# Patient Record
Sex: Female | Born: 1995
Health system: Southern US, Community
[De-identification: ages and names within clinical notes are randomized; demographics above are authoritative.]

## PROBLEM LIST (undated history)

## (undated) DIAGNOSIS — D649 Anemia, unspecified: Secondary | ICD-10-CM

## (undated) DIAGNOSIS — IMO0002 Reserved for concepts with insufficient information to code with codable children: Secondary | ICD-10-CM

## (undated) DIAGNOSIS — T7840XA Allergy, unspecified, initial encounter: Secondary | ICD-10-CM

## (undated) HISTORY — DX: Reserved for concepts with insufficient information to code with codable children: IMO0002

## (undated) HISTORY — DX: Allergy, unspecified, initial encounter: T78.40XA

## (undated) HISTORY — PX: NO PAST SURGERIES: SHX2092

## (undated) HISTORY — DX: Anemia, unspecified: D64.9

---
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
## 2015-02-12 ENCOUNTER — Ambulatory Visit (INDEPENDENT_AMBULATORY_CARE_PROVIDER_SITE_OTHER): Payer: BLUE CROSS/BLUE SHIELD | Admitting: Internal Medicine

## 2015-02-12 ENCOUNTER — Encounter: Payer: Self-pay | Admitting: Internal Medicine

## 2015-02-12 VITALS — BP 120/84 | Temp 98.7°F | Ht 62.75 in | Wt 145.0 lb

## 2015-02-12 DIAGNOSIS — Z113 Encounter for screening for infections with a predominantly sexual mode of transmission: Secondary | ICD-10-CM

## 2015-02-12 DIAGNOSIS — Z0000 Encounter for general adult medical examination without abnormal findings: Secondary | ICD-10-CM

## 2015-02-12 LAB — CBC WITH DIFFERENTIAL/PLATELET
BASOS PCT: 0.4 % (ref 0.0–3.0)
Basophils Absolute: 0 10*3/uL (ref 0.0–0.1)
EOS PCT: 1.3 % (ref 0.0–5.0)
Eosinophils Absolute: 0.1 10*3/uL (ref 0.0–0.7)
HCT: 36.8 % (ref 36.0–49.0)
HEMOGLOBIN: 11.9 g/dL — AB (ref 12.0–16.0)
LYMPHS ABS: 2.3 10*3/uL (ref 0.7–4.0)
Lymphocytes Relative: 24.1 % (ref 24.0–48.0)
MCHC: 32.2 g/dL (ref 31.0–37.0)
MCV: 79.1 fl (ref 78.0–98.0)
MONO ABS: 0.7 10*3/uL (ref 0.1–1.0)
Monocytes Relative: 7.6 % (ref 3.0–12.0)
Neutro Abs: 6.3 10*3/uL (ref 1.4–7.7)
Neutrophils Relative %: 66.6 % (ref 43.0–71.0)
Platelets: 289 10*3/uL (ref 150.0–575.0)
RBC: 4.66 Mil/uL (ref 3.80–5.70)
RDW: 14.3 % (ref 11.4–15.5)
WBC: 9.5 10*3/uL (ref 4.5–13.5)

## 2015-02-12 LAB — RPR

## 2015-02-12 NOTE — Progress Notes (Signed)
Pre visit review using our clinic review tool, if applicable. No additional management support is needed unless otherwise documented below in the visit note.   Chief Complaint  Patient presents with  . Well Child  . Annual Exam    HPI: Patient  Brittany Simon  20 y.o. comes in today for Preventive Health Care visit   To do semester in [place].    A sophomore.    At [employer] education minor in [place]. No major concerns  . Wants to be screening for sti  No current relation ship condoms in past . Periods  :  "worse when younger" some headaches  .   And cramps   Last 4-6 . Tired ibu   Tylenol.  No ref need for inhaler or regular meds   Health Maintenance  Topic Date Due  . HIV Screening  08/29/2010  . TETANUS/TDAP  08/29/2014  . INFLUENZA VACCINE  09/04/2015   Health Maintenance Review LIFESTYLE:  Exercise:    Walking   Active  Tobacco/ETS:no Alcohol:  No  Sugar beverages: sweet tea.  Sleep: about 7   Drug use: no   ROS:  GEN/ HEENT: No fever, significant weight changes sweats headaches vision problems hearing changes, CV/ PULM; No chest pain shortness of breath cough, syncope,edema  change in exercise tolerance. GI /GU: No adominal pain, vomiting, change in bowel habits. No blood in the stool. No significant GU symptoms. SKIN/HEME: ,no acute skin rashes suspicious lesions or bleeding. No lymphadenopathy, nodules, masses.  NEURO/ PSYCH:  No neurologic signs such as weakness numbness. No depression anxiety. IMM/ Allergy: No unusual infections.  Allergy .   REST of 12 system review negative except as per HPI   Past Medical History  Diagnosis Date  . Allergy   . Asthma   . Prematurity, fetus 35-36 completed weeks of gestation      4 40z 36 weeks    No past surgical history on file.  Family History  Problem Relation Age of Onset  . Asthma Father     Social History   Social History  . Marital Status: Single    Spouse Name: N/A  . Number of Children: N/A  . Years  of Education: N/A   Social History Main Topics  . Smoking status: Never Smoker   . Smokeless tobacco: None  . Alcohol Use: None  . Drug Use: None  . Sexual Activity: Not Asked   Other Topics Concern  . None   Social History Narrative   Single   Orig from [facility] of 4  One dog      IB program.    [employer].   [employer] fall 78   [employer]    Education major, spanish minor     Outpatient Prescriptions Prior to Visit  Medication Sig Dispense Refill  . albuterol (PROAIR HFA) 108 (90 BASE) MCG/ACT inhaler Inhale 2 puffs into the lungs every 6 (six) hours as needed for wheezing. 1 Inhaler 0  . fexofenadine (ALLEGRA) 60 MG tablet Take 60 mg by mouth daily.    . fluticasone (FLONASE) 50 MCG/ACT nasal spray 2 spray each nostril qd 16 g 3   No facility-administered medications prior to visit.     EXAM:  BP 120/84 mmHg  Temp(Src) 98.7 F (37.1 C) (Oral)  Ht 5' 2.75" (1.594 m)  Wt 145 lb (65.772 kg)  BMI 25.89 kg/m2  Body mass index is 25.89 kg/(m^2).  Physical Exam: Vital signs reviewed JOI:NOMV is a  well-developed well-nourished alert cooperative    who appearsr stated age in no acute distress.  HEENT: normocephalic atraumatic , Eyes: PERRL EOM's full, conjunctiva clear, Nares: paten,t no deformity discharge or tenderness., Ears: no deformity EAC's clear TMs with normal landmarks. Mouth: clear OP, no lesions, edema.  Moist mucous membranes. Dentition in adequate repair. NECK: supple without masses, thyromegaly or bruits. CHEST/PULM:  Clear to auscultation and percussion breath sounds equal no wheeze , rales or rhonchi. No chest wall deformities or tenderness.Breast: normal by inspection . No dimpling, discharge, masses, tenderness or discharge . CV: PMI is nondisplaced, S1 S2 no gallops, murmurs, rubs. Peripheral pulses are full without delay.No JVD .  ABDOMEN: Bowel sounds normal nontender  No guard or rebound, no hepato splenomegal no CVA tenderness.  Pierced umbilicus   Extremtities:  No clubbing cyanosis or edema, no acute joint swelling or redness no focal atrophy NEURO:  Oriented x3, cranial nerves 3-12 appear to be intact, no obvious focal weakness,gait within normal limits no abnormal reflexes or asymmetrical SKIN: No acute rashes normal turgor, color, no bruising or petechiae. PSYCH: Oriented, good eye contact, no obvious depression anxiety, cognition and judgment appear normal. LN: no cervical axillary inguinal adenopathy  Lab Results  Component Value Date   WBC 7.0 07/08/2013   HGB 12.3 07/08/2013   HCT 37.2 07/08/2013   PLT 287 07/08/2013   GLUCOSE 74 07/08/2013   CHOL 155 07/08/2013   TRIG 61 07/08/2013   HDL 57 07/08/2013   LDLCALC 86 07/08/2013   ALT 9 07/08/2013   AST 13 07/08/2013   NA 140 07/08/2013   K 3.9 07/08/2013   CL 105 07/08/2013   CREATININE 0.46 07/08/2013   BUN 11 07/08/2013   CO2 27 07/08/2013   TSH 1.031 07/08/2013    ASSESSMENT AND PLAN:  Discussed the following assessment and plan:  Visit for preventive health examination - Plan: GC/chlamydia probe amp, urine, HIV antibody, RPR, CBC with Differential/Platelet  Routine screening for STI (sexually transmitted infection) - Plan: GC/chlamydia probe amp, urine, HIV antibody, RPR, CBC with Differential/Platelet Chart reviewed: immunizations are up to date and documented.  Patient Care Team: Burnis Medin, MD as PCP - General Patient Instructions  Will notify you  of labs when available.  Please sign up for my chart . Take ibuprofen early .   toi prevent cramps from getting full blown .   Preventive Care for Adults, Female A healthy lifestyle and preventive care can promote health and wellness. Preventive health guidelines for women include the following key practices.  A routine yearly physical is a good way to check with your health care provider about your health and preventive screening. It is a chance to share any concerns and updates on your health and  to receive a thorough exam.  Visit your dentist for a routine exam and preventive care every 6 months. Brush your teeth twice a day and floss once a day. Good oral hygiene prevents tooth decay and gum disease.  The frequency of eye exams is based on your age, health, family medical history, use of contact lenses, and other factors. Follow your health care provider's recommendations for frequency of eye exams.  Eat a healthy diet. Foods like vegetables, fruits, whole grains, low-fat dairy products, and lean protein foods contain the nutrients you need without too many calories. Decrease your intake of foods high in solid fats, added sugars, and salt. Eat the right amount of calories for you.Get information about a proper diet from  your health care provider, if necessary.  Regular physical exercise is one of the most important things you can do for your health. Most adults should get at least 150 minutes of moderate-intensity exercise (any activity that increases your heart rate and causes you to sweat) each week. In addition, most adults need muscle-strengthening exercises on 2 or more days a week.  Maintain a healthy weight. The body mass index (BMI) is a screening tool to identify possible weight problems. It provides an estimate of body fat based on height and weight. Your health care provider can find your BMI and can help you achieve or maintain a healthy weight.For adults 20 years and older:  A BMI below 18.5 is considered underweight.  A BMI of 18.5 to 24.9 is normal.  A BMI of 25 to 29.9 is considered overweight.  A BMI of 30 and above is considered obese.  Maintain normal blood lipids and cholesterol levels by exercising and minimizing your intake of saturated fat. Eat a balanced diet with plenty of fruit and vegetables. Blood tests for lipids and cholesterol should begin at age 86 and be repeated every 5 years. If your lipid or cholesterol levels are high, you are over 50, or you are  at high risk for heart disease, you may need your cholesterol levels checked more frequently.Ongoing high lipid and cholesterol levels should be treated with medicines if diet and exercise are not working.  If you smoke, find out from your health care provider how to quit. If you do not use tobacco, do not start.  Lung cancer screening is recommended for adults aged 28-80 years who are at high risk for developing lung cancer because of a history of smoking. A yearly low-dose CT scan of the lungs is recommended for people who have at least a 30-pack-year history of smoking and are a current smoker or have quit within the past 15 years. A pack year of smoking is smoking an average of 1 pack of cigarettes a day for 1 year (for example: 1 pack a day for 30 years or 2 packs a day for 15 years). Yearly screening should continue until the smoker has stopped smoking for at least 15 years. Yearly screening should be stopped for people who develop a health problem that would prevent them from having lung cancer treatment.  If you are pregnant, do not drink alcohol. If you are breastfeeding, be very cautious about drinking alcohol. If you are not pregnant and choose to drink alcohol, do not have more than 1 drink per day. One drink is considered to be 12 ounces (355 mL) of beer, 5 ounces (148 mL) of wine, or 1.5 ounces (44 mL) of liquor.  Avoid use of street drugs. Do not share needles with anyone. Ask for help if you need support or instructions about stopping the use of drugs.  High blood pressure causes heart disease and increases the risk of stroke. Your blood pressure should be checked at least every 1 to 2 years. Ongoing high blood pressure should be treated with medicines if weight loss and exercise do not work.  If you are 32-37 years old, ask your health care provider if you should take aspirin to prevent strokes.  Diabetes screening is done by taking a blood sample to check your blood glucose level  after you have not eaten for a certain period of time (fasting). If you are not overweight and you do not have risk factors for diabetes, you should be screened  once every 3 years starting at age 43. If you are overweight or obese and you are 11-54 years of age, you should be screened for diabetes every year as part of your cardiovascular risk assessment.  Breast cancer screening is essential preventive care for women. You should practice "breast self-awareness." This means understanding the normal appearance and feel of your breasts and may include breast self-examination. Any changes detected, no matter how small, should be reported to a health care provider. Women in their 39s and 30s should have a clinical breast exam (CBE) by a health care provider as part of a regular health exam every 1 to 3 years. After age 18, women should have a CBE every year. Starting at age 53, women should consider having a mammogram (breast X-ray test) every year. Women who have a family history of breast cancer should talk to their health care provider about genetic screening. Women at a high risk of breast cancer should talk to their health care providers about having an MRI and a mammogram every year.  Breast cancer gene (BRCA)-related cancer risk assessment is recommended for women who have family members with BRCA-related cancers. BRCA-related cancers include breast, ovarian, tubal, and peritoneal cancers. Having family members with these cancers may be associated with an increased risk for harmful changes (mutations) in the breast cancer genes BRCA1 and BRCA2. Results of the assessment will determine the need for genetic counseling and BRCA1 and BRCA2 testing.  Your health care provider may recommend that you be screened regularly for cancer of the pelvic organs (ovaries, uterus, and vagina). This screening involves a pelvic examination, including checking for microscopic changes to the surface of your cervix (Pap test).  You may be encouraged to have this screening done every 3 years, beginning at age 64.  For women ages 27-65, health care providers may recommend pelvic exams and Pap testing every 3 years, or they may recommend the Pap and pelvic exam, combined with testing for human papilloma virus (HPV), every 5 years. Some types of HPV increase your risk of cervical cancer. Testing for HPV may also be done on women of any age with unclear Pap test results.  Other health care providers may not recommend any screening for nonpregnant women who are considered low risk for pelvic cancer and who do not have symptoms. Ask your health care provider if a screening pelvic exam is right for you.  If you have had past treatment for cervical cancer or a condition that could lead to cancer, you need Pap tests and screening for cancer for at least 20 years after your treatment. If Pap tests have been discontinued, your risk factors (such as having a new sexual partner) need to be reassessed to determine if screening should resume. Some women have medical problems that increase the chance of getting cervical cancer. In these cases, your health care provider may recommend more frequent screening and Pap tests.  Colorectal cancer can be detected and often prevented. Most routine colorectal cancer screening begins at the age of 71 years and continues through age 52 years. However, your health care provider may recommend screening at an earlier age if you have risk factors for colon cancer. On a yearly basis, your health care provider may provide home test kits to check for hidden blood in the stool. Use of a small camera at the end of a tube, to directly examine the colon (sigmoidoscopy or colonoscopy), can detect the earliest forms of colorectal cancer. Talk to your health  care provider about this at age 56, when routine screening begins. Direct exam of the colon should be repeated every 5-10 years through age 40 years, unless early  forms of precancerous polyps or small growths are found.  People who are at an increased risk for hepatitis B should be screened for this virus. You are considered at high risk for hepatitis B if:  You were born in a country where hepatitis B occurs often. Talk with your health care provider about which countries are considered high risk.  Your parents were born in a high-risk country and you have not received a shot to protect against hepatitis B (hepatitis B vaccine).  You have HIV or AIDS.  You use needles to inject street drugs.  You live with, or have sex with, someone who has hepatitis B.  You get hemodialysis treatment.  You take certain medicines for conditions like cancer, organ transplantation, and autoimmune conditions.  Hepatitis C blood testing is recommended for all people born from [birth year] through 1965 and any individual with known risks for hepatitis C.  Practice safe sex. Use condoms and avoid high-risk sexual practices to reduce the spread of sexually transmitted infections (STIs). STIs include gonorrhea, chlamydia, syphilis, trichomonas, herpes, HPV, and human immunodeficiency virus (HIV). Herpes, HIV, and HPV are viral illnesses that have no cure. They can result in disability, cancer, and death.  You should be screened for sexually transmitted illnesses (STIs) including gonorrhea and chlamydia if:  You are sexually active and are younger than 24 years.  You are older than 24 years and your health care provider tells you that you are at risk for this type of infection.  Your sexual activity has changed since you were last screened and you are at an increased risk for chlamydia or gonorrhea. Ask your health care provider if you are at risk.  If you are at risk of being infected with HIV, it is recommended that you take a prescription medicine daily to prevent HIV infection. This is called preexposure prophylaxis (PrEP). You are considered at risk if:  You are sexually  active and do not regularly use condoms or know the HIV status of your partner(s).  You take drugs by injection.  You are sexually active with a partner who has HIV.  Talk with your health care provider about whether you are at high risk of being infected with HIV. If you choose to begin PrEP, you should first be tested for HIV. You should then be tested every 3 months for as long as you are taking PrEP.  Osteoporosis is a disease in which the bones lose minerals and strength with aging. This can result in serious bone fractures or breaks. The risk of osteoporosis can be identified using a bone density scan. Women ages 55 years and over and women at risk for fractures or osteoporosis should discuss screening with their health care providers. Ask your health care provider whether you should take a calcium supplement or vitamin D to reduce the rate of osteoporosis.  Menopause can be associated with physical symptoms and risks. Hormone replacement therapy is available to decrease symptoms and risks. You should talk to your health care provider about whether hormone replacement therapy is right for you.  Use sunscreen. Apply sunscreen liberally and repeatedly throughout the day. You should seek shade when your shadow is shorter than you. Protect yourself by wearing long sleeves, pants, a wide-brimmed hat, and sunglasses year round, whenever you are outdoors.  Once a month,  do a whole body skin exam, using a mirror to look at the skin on your back. Tell your health care provider of new moles, moles that have irregular borders, moles that are larger than a pencil eraser, or moles that have changed in shape or color.  Stay current with required vaccines (immunizations).  Influenza vaccine. All adults should be immunized every year.  Tetanus, diphtheria, and acellular pertussis (Td, Tdap) vaccine. Pregnant women should receive 1 dose of Tdap vaccine during each pregnancy. The dose should be obtained  regardless of the length of time since the last dose. Immunization is preferred during the 27th-36th week of gestation. An adult who has not previously received Tdap or who does not know her vaccine status should receive 1 dose of Tdap. This initial dose should be followed by tetanus and diphtheria toxoids (Td) booster doses every 10 years. Adults with an unknown or incomplete history of completing a 3-dose immunization series with Td-containing vaccines should begin or complete a primary immunization series including a Tdap dose. Adults should receive a Td booster every 10 years.  Varicella vaccine. An adult without evidence of immunity to varicella should receive 2 doses or a second dose if she has previously received 1 dose. Pregnant females who do not have evidence of immunity should receive the first dose after pregnancy. This first dose should be obtained before leaving the health care facility. The second dose should be obtained 4-8 weeks after the first dose.  Human papillomavirus (HPV) vaccine. Females aged 13-26 years who have not received the vaccine previously should obtain the 3-dose series. The vaccine is not recommended for use in pregnant females. However, pregnancy testing is not needed before receiving a dose. If a female is found to be pregnant after receiving a dose, no treatment is needed. In that case, the remaining doses should be delayed until after the pregnancy. Immunization is recommended for any person with an immunocompromised condition through the age of 59 years if she did not get any or all doses earlier. During the 3-dose series, the second dose should be obtained 4-8 weeks after the first dose. The third dose should be obtained 24 weeks after the first dose and 16 weeks after the second dose.  Zoster vaccine. One dose is recommended for adults aged 22 years or older unless certain conditions are present.  Measles, mumps, and rubella (MMR) vaccine. Adults born before [birth year]  generally are considered immune to measles and mumps. Adults born in [birth year] or later should have 1 or more doses of MMR vaccine unless there is a contraindication to the vaccine or there is laboratory evidence of immunity to each of the three diseases. A routine second dose of MMR vaccine should be obtained at least 28 days after the first dose for students attending postsecondary schools, health care workers, or international travelers. People who received inactivated measles vaccine or an unknown type of measles vaccine during 1963-1967 should receive 2 doses of MMR vaccine. People who received inactivated mumps vaccine or an unknown type of mumps vaccine before 1979 and are at high risk for mumps infection should consider immunization with 2 doses of MMR vaccine. For females of childbearing age, rubella immunity should be determined. If there is no evidence of immunity, females who are not pregnant should be vaccinated. If there is no evidence of immunity, females who are pregnant should delay immunization until after pregnancy. Unvaccinated health care workers born before [birth year] who lack laboratory evidence of measles, mumps, or rubella immunity  or laboratory confirmation of disease should consider measles and mumps immunization with 2 doses of MMR vaccine or rubella immunization with 1 dose of MMR vaccine.  Pneumococcal 13-valent conjugate (PCV13) vaccine. When indicated, a person who is uncertain of his immunization history and has no record of immunization should receive the PCV13 vaccine. All adults 21 years of age and older should receive this vaccine. An adult aged 34 years or older who has certain medical conditions and has not been previously immunized should receive 1 dose of PCV13 vaccine. This PCV13 should be followed with a dose of pneumococcal polysaccharide (PPSV23) vaccine. Adults who are at high risk for pneumococcal disease should obtain the PPSV23 vaccine at least 8 weeks after the dose of PCV13  vaccine. Adults older than 20 years of age who have normal immune system function should obtain the PPSV23 vaccine dose at least 1 year after the dose of PCV13 vaccine.  Pneumococcal polysaccharide (PPSV23) vaccine. When PCV13 is also indicated, PCV13 should be obtained first. All adults aged 55 years and older should be immunized. An adult younger than age 61 years who has certain medical conditions should be immunized. Any person who resides in a nursing home or long-term care facility should be immunized. An adult smoker should be immunized. People with an immunocompromised condition and certain other conditions should receive both PCV13 and PPSV23 vaccines. People with human immunodeficiency virus (HIV) infection should be immunized as soon as possible after diagnosis. Immunization during chemotherapy or radiation therapy should be avoided. Routine use of PPSV23 vaccine is not recommended for American Indians, [place] Natives, or people younger than 65 years unless there are medical conditions that require PPSV23 vaccine. When indicated, people who have unknown immunization and have no record of immunization should receive PPSV23 vaccine. One-time revaccination 5 years after the first dose of PPSV23 is recommended for people aged 19-64 years who have chronic kidney failure, nephrotic syndrome, asplenia, or immunocompromised conditions. People who received 1-2 doses of PPSV23 before age 34 years should receive another dose of PPSV23 vaccine at age 75 years or later if at least 5 years have passed since the previous dose. Doses of PPSV23 are not needed for people immunized with PPSV23 at or after age 3 years.  Meningococcal vaccine. Adults with asplenia or persistent complement component deficiencies should receive 2 doses of quadrivalent meningococcal conjugate (MenACWY-D) vaccine. The doses should be obtained at least 2 months apart. Microbiologists working with certain meningococcal bacteria, [employer]  recruits, people at risk during an outbreak, and people who travel to or live in countries with a high rate of meningitis should be immunized. A first-year college student up through age 43 years who is living in a residence hall should receive a dose if she did not receive a dose on or after her 16th birthday. Adults who have certain high-risk conditions should receive one or more doses of vaccine.  Hepatitis A vaccine. Adults who wish to be protected from this disease, have certain high-risk conditions, work with hepatitis A-infected animals, work in hepatitis A research labs, or travel to or work in countries with a high rate of hepatitis A should be immunized. Adults who were previously unvaccinated and who anticipate close contact with an international adoptee during the first 60 days after arrival in the [place] States from a country with a high rate of hepatitis A should be immunized.  Hepatitis B vaccine. Adults who wish to be protected from this disease, have certain high-risk conditions, may be exposed to  blood or other infectious body fluids, are household contacts or sex partners of hepatitis B positive people, are clients or workers in certain care facilities, or travel to or work in countries with a high rate of hepatitis B should be immunized.  Haemophilus influenzae type b (Hib) vaccine. A previously unvaccinated person with asplenia or sickle cell disease or having a scheduled splenectomy should receive 1 dose of Hib vaccine. Regardless of previous immunization, a recipient of a hematopoietic stem cell transplant should receive a 3-dose series 6-12 months after her successful transplant. Hib vaccine is not recommended for adults with HIV infection. Preventive Services / Frequency Ages 58 to 2 years  Blood pressure check.** / Every 3-5 years.  Lipid and cholesterol check.** / Every 5 years beginning at age 30.  Clinical breast exam.** / Every 3 years for women in their 46s and  59s.  BRCA-related cancer risk assessment.** / For women who have family members with a BRCA-related cancer (breast, ovarian, tubal, or peritoneal cancers).  Pap test.** / Every 2 years from ages 97 through 6. Every 3 years starting at age 26 through age 77 or 30 with a history of 3 consecutive normal Pap tests.  HPV screening.** / Every 3 years from ages 60 through ages 25 to 73 with a history of 3 consecutive normal Pap tests.  Hepatitis C blood test.** / For any individual with known risks for hepatitis C.  Skin self-exam. / Monthly.  Influenza vaccine. / Every year.  Tetanus, diphtheria, and acellular pertussis (Tdap, Td) vaccine.** / Consult your health care provider. Pregnant women should receive 1 dose of Tdap vaccine during each pregnancy. 1 dose of Td every 10 years.  Varicella vaccine.** / Consult your health care provider. Pregnant females who do not have evidence of immunity should receive the first dose after pregnancy.  HPV vaccine. / 3 doses over 6 months, if 9 and younger. The vaccine is not recommended for use in pregnant females. However, pregnancy testing is not needed before receiving a dose.  Measles, mumps, rubella (MMR) vaccine.** / You need at least 1 dose of MMR if you were born in 1957 or later. You may also need a 2nd dose. For females of childbearing age, rubella immunity should be determined. If there is no evidence of immunity, females who are not pregnant should be vaccinated. If there is no evidence of immunity, females who are pregnant should delay immunization until after pregnancy.  Pneumococcal 13-valent conjugate (PCV13) vaccine.** / Consult your health care provider.  Pneumococcal polysaccharide (PPSV23) vaccine.** / 1 to 2 doses if you smoke cigarettes or if you have certain conditions.  Meningococcal vaccine.** / 1 dose if you are age 24 to 68 years and a Market researcher living in a residence hall, or have one of several medical  conditions, you need to get vaccinated against meningococcal disease. You may also need additional booster doses.  Hepatitis A vaccine.** / Consult your health care provider.  Hepatitis B vaccine.** / Consult your health care provider.  Haemophilus influenzae type b (Hib) vaccine.** / Consult your health care provider. Ages 39 to 34 years  Blood pressure check.** / Every year.  Lipid and cholesterol check.** / Every 5 years beginning at age 46 years.  Lung cancer screening. / Every year if you are aged 8-80 years and have a 30-pack-year history of smoking and currently smoke or have quit within the past 15 years. Yearly screening is stopped once you have quit smoking for at least  15 years or develop a health problem that would prevent you from having lung cancer treatment.  Clinical breast exam.** / Every year after age 18 years.  BRCA-related cancer risk assessment.** / For women who have family members with a BRCA-related cancer (breast, ovarian, tubal, or peritoneal cancers).  Mammogram.** / Every year beginning at age 51 years and continuing for as long as you are in good health. Consult with your health care provider.  Pap test.** / Every 3 years starting at age 6 years through age 20 or 64 years with a history of 3 consecutive normal Pap tests.  HPV screening.** / Every 3 years from ages 39 years through ages 25 to 51 years with a history of 3 consecutive normal Pap tests.  Fecal occult blood test (FOBT) of stool. / Every year beginning at age 47 years and continuing until age 53 years. You may not need to do this test if you get a colonoscopy every 10 years.  Flexible sigmoidoscopy or colonoscopy.** / Every 5 years for a flexible sigmoidoscopy or every 10 years for a colonoscopy beginning at age 6 years and continuing until age 30 years.  Hepatitis C blood test.** / For all people born from [birth year] through 1965 and any individual with known risks for hepatitis C.  Skin  self-exam. / Monthly.  Influenza vaccine. / Every year.  Tetanus, diphtheria, and acellular pertussis (Tdap/Td) vaccine.** / Consult your health care provider. Pregnant women should receive 1 dose of Tdap vaccine during each pregnancy. 1 dose of Td every 10 years.  Varicella vaccine.** / Consult your health care provider. Pregnant females who do not have evidence of immunity should receive the first dose after pregnancy.  Zoster vaccine.** / 1 dose for adults aged 40 years or older.  Measles, mumps, rubella (MMR) vaccine.** / You need at least 1 dose of MMR if you were born in 1957 or later. You may also need a second dose. For females of childbearing age, rubella immunity should be determined. If there is no evidence of immunity, females who are not pregnant should be vaccinated. If there is no evidence of immunity, females who are pregnant should delay immunization until after pregnancy.  Pneumococcal 13-valent conjugate (PCV13) vaccine.** / Consult your health care provider.  Pneumococcal polysaccharide (PPSV23) vaccine.** / 1 to 2 doses if you smoke cigarettes or if you have certain conditions.  Meningococcal vaccine.** / Consult your health care provider.  Hepatitis A vaccine.** / Consult your health care provider.  Hepatitis B vaccine.** / Consult your health care provider.  Haemophilus influenzae type b (Hib) vaccine.** / Consult your health care provider. Ages 39 years and over  Blood pressure check.** / Every year.  Lipid and cholesterol check.** / Every 5 years beginning at age 34 years.  Lung cancer screening. / Every year if you are aged 12-80 years and have a 30-pack-year history of smoking and currently smoke or have quit within the past 15 years. Yearly screening is stopped once you have quit smoking for at least 15 years or develop a health problem that would prevent you from having lung cancer treatment.  Clinical breast exam.** / Every year after age 42  years.  BRCA-related cancer risk assessment.** / For women who have family members with a BRCA-related cancer (breast, ovarian, tubal, or peritoneal cancers).  Mammogram.** / Every year beginning at age 21 years and continuing for as long as you are in good health. Consult with your health care provider.  Pap test.** /  Every 3 years starting at age 71 years through age 45 or 47 years with 3 consecutive normal Pap tests. Testing can be stopped between 65 and 70 years with 3 consecutive normal Pap tests and no abnormal Pap or HPV tests in the past 10 years.  HPV screening.** / Every 3 years from ages 23 years through ages 68 or 32 years with a history of 3 consecutive normal Pap tests. Testing can be stopped between 65 and 70 years with 3 consecutive normal Pap tests and no abnormal Pap or HPV tests in the past 10 years.  Fecal occult blood test (FOBT) of stool. / Every year beginning at age 71 years and continuing until age 76 years. You may not need to do this test if you get a colonoscopy every 10 years.  Flexible sigmoidoscopy or colonoscopy.** / Every 5 years for a flexible sigmoidoscopy or every 10 years for a colonoscopy beginning at age 19 years and continuing until age 36 years.  Hepatitis C blood test.** / For all people born from [birth year] through 1965 and any individual with known risks for hepatitis C.  Osteoporosis screening.** / A one-time screening for women ages 27 years and over and women at risk for fractures or osteoporosis.  Skin self-exam. / Monthly.  Influenza vaccine. / Every year.  Tetanus, diphtheria, and acellular pertussis (Tdap/Td) vaccine.** / 1 dose of Td every 10 years.  Varicella vaccine.** / Consult your health care provider.  Zoster vaccine.** / 1 dose for adults aged 3 years or older.  Pneumococcal 13-valent conjugate (PCV13) vaccine.** / Consult your health care provider.  Pneumococcal polysaccharide (PPSV23) vaccine.** / 1 dose for all adults aged 94  years and older.  Meningococcal vaccine.** / Consult your health care provider.  Hepatitis A vaccine.** / Consult your health care provider.  Hepatitis B vaccine.** / Consult your health care provider.  Haemophilus influenzae type b (Hib) vaccine.** / Consult your health care provider. ** Family history and personal history of risk and conditions may change your health care provider's recommendations.   This information is not intended to replace advice given to you by your health care provider. Make sure you discuss any questions you have with your health care provider.   Document Released: 03/18/2001 Document Revised: 02/10/2014 Document Reviewed: 06/17/2010 Elsevier Interactive Patient Education 2016 [place]. Janiqua Friscia M.D.

## 2015-02-12 NOTE — Patient Instructions (Addendum)
Will notify you  of labs when available.  Please sign up for my chart . Take ibuprofen early .   toi prevent cramps from getting full blown .   Preventive Care for Adults, Female A healthy lifestyle and preventive care can promote health and wellness. Preventive health guidelines for women include the following key practices.  A routine yearly physical is a good way to check with your health care provider about your health and preventive screening. It is a chance to share any concerns and updates on your health and to receive a thorough exam.  Visit your dentist for a routine exam and preventive care every 6 months. Brush your teeth twice a day and floss once a day. Good oral hygiene prevents tooth decay and gum disease.  The frequency of eye exams is based on your age, health, family medical history, use of contact lenses, and other factors. Follow your health care provider's recommendations for frequency of eye exams.  Eat a healthy diet. Foods like vegetables, fruits, whole grains, low-fat dairy products, and lean protein foods contain the nutrients you need without too many calories. Decrease your intake of foods high in solid fats, added sugars, and salt. Eat the right amount of calories for you.Get information about a proper diet from your health care provider, if necessary.  Regular physical exercise is one of the most important things you can do for your health. Most adults should get at least 150 minutes of moderate-intensity exercise (any activity that increases your heart rate and causes you to sweat) each week. In addition, most adults need muscle-strengthening exercises on 2 or more days a week.  Maintain a healthy weight. The body mass index (BMI) is a screening tool to identify possible weight problems. It provides an estimate of body fat based on height and weight. Your health care provider can find your BMI and can help you achieve or maintain a healthy weight.For adults 20 years  and older:  A BMI below 18.5 is considered underweight.  A BMI of 18.5 to 24.9 is normal.  A BMI of 25 to 29.9 is considered overweight.  A BMI of 30 and above is considered obese.  Maintain normal blood lipids and cholesterol levels by exercising and minimizing your intake of saturated fat. Eat a balanced diet with plenty of fruit and vegetables. Blood tests for lipids and cholesterol should begin at age 36 and be repeated every 5 years. If your lipid or cholesterol levels are high, you are over 50, or you are at high risk for heart disease, you may need your cholesterol levels checked more frequently.Ongoing high lipid and cholesterol levels should be treated with medicines if diet and exercise are not working.  If you smoke, find out from your health care provider how to quit. If you do not use tobacco, do not start.  Lung cancer screening is recommended for adults aged 62-80 years who are at high risk for developing lung cancer because of a history of smoking. A yearly low-dose CT scan of the lungs is recommended for people who have at least a 30-pack-year history of smoking and are a current smoker or have quit within the past 15 years. A pack year of smoking is smoking an average of 1 pack of cigarettes a day for 1 year (for example: 1 pack a day for 30 years or 2 packs a day for 15 years). Yearly screening should continue until the smoker has stopped smoking for at least 15 years. Yearly  screening should be stopped for people who develop a health problem that would prevent them from having lung cancer treatment.  If you are pregnant, do not drink alcohol. If you are breastfeeding, be very cautious about drinking alcohol. If you are not pregnant and choose to drink alcohol, do not have more than 1 drink per day. One drink is considered to be 12 ounces (355 mL) of beer, 5 ounces (148 mL) of wine, or 1.5 ounces (44 mL) of liquor.  Avoid use of street drugs. Do not share needles with anyone.  Ask for help if you need support or instructions about stopping the use of drugs.  High blood pressure causes heart disease and increases the risk of stroke. Your blood pressure should be checked at least every 1 to 2 years. Ongoing high blood pressure should be treated with medicines if weight loss and exercise do not work.  If you are 54-8 years old, ask your health care provider if you should take aspirin to prevent strokes.  Diabetes screening is done by taking a blood sample to check your blood glucose level after you have not eaten for a certain period of time (fasting). If you are not overweight and you do not have risk factors for diabetes, you should be screened once every 3 years starting at age 84. If you are overweight or obese and you are 34-37 years of age, you should be screened for diabetes every year as part of your cardiovascular risk assessment.  Breast cancer screening is essential preventive care for women. You should practice "breast self-awareness." This means understanding the normal appearance and feel of your breasts and may include breast self-examination. Any changes detected, no matter how small, should be reported to a health care provider. Women in their 35s and 30s should have a clinical breast exam (CBE) by a health care provider as part of a regular health exam every 1 to 3 years. After age 60, women should have a CBE every year. Starting at age 45, women should consider having a mammogram (breast X-ray test) every year. Women who have a family history of breast cancer should talk to their health care provider about genetic screening. Women at a high risk of breast cancer should talk to their health care providers about having an MRI and a mammogram every year.  Breast cancer gene (BRCA)-related cancer risk assessment is recommended for women who have family members with BRCA-related cancers. BRCA-related cancers include breast, ovarian, tubal, and peritoneal cancers.  Having family members with these cancers may be associated with an increased risk for harmful changes (mutations) in the breast cancer genes BRCA1 and BRCA2. Results of the assessment will determine the need for genetic counseling and BRCA1 and BRCA2 testing.  Your health care provider may recommend that you be screened regularly for cancer of the pelvic organs (ovaries, uterus, and vagina). This screening involves a pelvic examination, including checking for microscopic changes to the surface of your cervix (Pap test). You may be encouraged to have this screening done every 3 years, beginning at age 93.  For women ages 31-65, health care providers may recommend pelvic exams and Pap testing every 3 years, or they may recommend the Pap and pelvic exam, combined with testing for human papilloma virus (HPV), every 5 years. Some types of HPV increase your risk of cervical cancer. Testing for HPV may also be done on women of any age with unclear Pap test results.  Other health care providers may not recommend  any screening for nonpregnant women who are considered low risk for pelvic cancer and who do not have symptoms. Ask your health care provider if a screening pelvic exam is right for you.  If you have had past treatment for cervical cancer or a condition that could lead to cancer, you need Pap tests and screening for cancer for at least 20 years after your treatment. If Pap tests have been discontinued, your risk factors (such as having a new sexual partner) need to be reassessed to determine if screening should resume. Some women have medical problems that increase the chance of getting cervical cancer. In these cases, your health care provider may recommend more frequent screening and Pap tests.  Colorectal cancer can be detected and often prevented. Most routine colorectal cancer screening begins at the age of 58 years and continues through age 25 years. However, your health care provider may recommend  screening at an earlier age if you have risk factors for colon cancer. On a yearly basis, your health care provider may provide home test kits to check for hidden blood in the stool. Use of a small camera at the end of a tube, to directly examine the colon (sigmoidoscopy or colonoscopy), can detect the earliest forms of colorectal cancer. Talk to your health care provider about this at age 14, when routine screening begins. Direct exam of the colon should be repeated every 5-10 years through age 4 years, unless early forms of precancerous polyps or small growths are found.  People who are at an increased risk for hepatitis B should be screened for this virus. You are considered at high risk for hepatitis B if:  You were born in a country where hepatitis B occurs often. Talk with your health care provider about which countries are considered high risk.  Your parents were born in a high-risk country and you have not received a shot to protect against hepatitis B (hepatitis B vaccine).  You have HIV or AIDS.  You use needles to inject street drugs.  You live with, or have sex with, someone who has hepatitis B.  You get hemodialysis treatment.  You take certain medicines for conditions like cancer, organ transplantation, and autoimmune conditions.  Hepatitis C blood testing is recommended for all people born from [birth year] through 1965 and any individual with known risks for hepatitis C.  Practice safe sex. Use condoms and avoid high-risk sexual practices to reduce the spread of sexually transmitted infections (STIs). STIs include gonorrhea, chlamydia, syphilis, trichomonas, herpes, HPV, and human immunodeficiency virus (HIV). Herpes, HIV, and HPV are viral illnesses that have no cure. They can result in disability, cancer, and death.  You should be screened for sexually transmitted illnesses (STIs) including gonorrhea and chlamydia if:  You are sexually active and are younger than 24 years.  You  are older than 24 years and your health care provider tells you that you are at risk for this type of infection.  Your sexual activity has changed since you were last screened and you are at an increased risk for chlamydia or gonorrhea. Ask your health care provider if you are at risk.  If you are at risk of being infected with HIV, it is recommended that you take a prescription medicine daily to prevent HIV infection. This is called preexposure prophylaxis (PrEP). You are considered at risk if:  You are sexually active and do not regularly use condoms or know the HIV status of your partner(s).  You take drugs by  injection.  You are sexually active with a partner who has HIV.  Talk with your health care provider about whether you are at high risk of being infected with HIV. If you choose to begin PrEP, you should first be tested for HIV. You should then be tested every 3 months for as long as you are taking PrEP.  Osteoporosis is a disease in which the bones lose minerals and strength with aging. This can result in serious bone fractures or breaks. The risk of osteoporosis can be identified using a bone density scan. Women ages 62 years and over and women at risk for fractures or osteoporosis should discuss screening with their health care providers. Ask your health care provider whether you should take a calcium supplement or vitamin D to reduce the rate of osteoporosis.  Menopause can be associated with physical symptoms and risks. Hormone replacement therapy is available to decrease symptoms and risks. You should talk to your health care provider about whether hormone replacement therapy is right for you.  Use sunscreen. Apply sunscreen liberally and repeatedly throughout the day. You should seek shade when your shadow is shorter than you. Protect yourself by wearing long sleeves, pants, a wide-brimmed hat, and sunglasses year round, whenever you are outdoors.  Once a month, do a whole body  skin exam, using a mirror to look at the skin on your back. Tell your health care provider of new moles, moles that have irregular borders, moles that are larger than a pencil eraser, or moles that have changed in shape or color.  Stay current with required vaccines (immunizations).  Influenza vaccine. All adults should be immunized every year.  Tetanus, diphtheria, and acellular pertussis (Td, Tdap) vaccine. Pregnant women should receive 1 dose of Tdap vaccine during each pregnancy. The dose should be obtained regardless of the length of time since the last dose. Immunization is preferred during the 27th-36th week of gestation. An adult who has not previously received Tdap or who does not know her vaccine status should receive 1 dose of Tdap. This initial dose should be followed by tetanus and diphtheria toxoids (Td) booster doses every 10 years. Adults with an unknown or incomplete history of completing a 3-dose immunization series with Td-containing vaccines should begin or complete a primary immunization series including a Tdap dose. Adults should receive a Td booster every 10 years.  Varicella vaccine. An adult without evidence of immunity to varicella should receive 2 doses or a second dose if she has previously received 1 dose. Pregnant females who do not have evidence of immunity should receive the first dose after pregnancy. This first dose should be obtained before leaving the health care facility. The second dose should be obtained 4-8 weeks after the first dose.  Human papillomavirus (HPV) vaccine. Females aged 13-26 years who have not received the vaccine previously should obtain the 3-dose series. The vaccine is not recommended for use in pregnant females. However, pregnancy testing is not needed before receiving a dose. If a female is found to be pregnant after receiving a dose, no treatment is needed. In that case, the remaining doses should be delayed until after the pregnancy.  Immunization is recommended for any person with an immunocompromised condition through the age of 26 years if she did not get any or all doses earlier. During the 3-dose series, the second dose should be obtained 4-8 weeks after the first dose. The third dose should be obtained 24 weeks after the first dose and 16 weeks  after the second dose.  Zoster vaccine. One dose is recommended for adults aged 45 years or older unless certain conditions are present.  Measles, mumps, and rubella (MMR) vaccine. Adults born before [birth year] generally are considered immune to measles and mumps. Adults born in [birth year] or later should have 1 or more doses of MMR vaccine unless there is a contraindication to the vaccine or there is laboratory evidence of immunity to each of the three diseases. A routine second dose of MMR vaccine should be obtained at least 28 days after the first dose for students attending postsecondary schools, health care workers, or international travelers. People who received inactivated measles vaccine or an unknown type of measles vaccine during 1963-1967 should receive 2 doses of MMR vaccine. People who received inactivated mumps vaccine or an unknown type of mumps vaccine before 1979 and are at high risk for mumps infection should consider immunization with 2 doses of MMR vaccine. For females of childbearing age, rubella immunity should be determined. If there is no evidence of immunity, females who are not pregnant should be vaccinated. If there is no evidence of immunity, females who are pregnant should delay immunization until after pregnancy. Unvaccinated health care workers born before [birth year] who lack laboratory evidence of measles, mumps, or rubella immunity or laboratory confirmation of disease should consider measles and mumps immunization with 2 doses of MMR vaccine or rubella immunization with 1 dose of MMR vaccine.  Pneumococcal 13-valent conjugate (PCV13) vaccine. When indicated, a person who is  uncertain of his immunization history and has no record of immunization should receive the PCV13 vaccine. All adults 6 years of age and older should receive this vaccine. An adult aged 75 years or older who has certain medical conditions and has not been previously immunized should receive 1 dose of PCV13 vaccine. This PCV13 should be followed with a dose of pneumococcal polysaccharide (PPSV23) vaccine. Adults who are at high risk for pneumococcal disease should obtain the PPSV23 vaccine at least 8 weeks after the dose of PCV13 vaccine. Adults older than 20 years of age who have normal immune system function should obtain the PPSV23 vaccine dose at least 1 year after the dose of PCV13 vaccine.  Pneumococcal polysaccharide (PPSV23) vaccine. When PCV13 is also indicated, PCV13 should be obtained first. All adults aged 1 years and older should be immunized. An adult younger than age 66 years who has certain medical conditions should be immunized. Any person who resides in a nursing home or long-term care facility should be immunized. An adult smoker should be immunized. People with an immunocompromised condition and certain other conditions should receive both PCV13 and PPSV23 vaccines. People with human immunodeficiency virus (HIV) infection should be immunized as soon as possible after diagnosis. Immunization during chemotherapy or radiation therapy should be avoided. Routine use of PPSV23 vaccine is not recommended for American Indians, [place] Natives, or people younger than 65 years unless there are medical conditions that require PPSV23 vaccine. When indicated, people who have unknown immunization and have no record of immunization should receive PPSV23 vaccine. One-time revaccination 5 years after the first dose of PPSV23 is recommended for people aged 19-64 years who have chronic kidney failure, nephrotic syndrome, asplenia, or immunocompromised conditions. People who received 1-2 doses of PPSV23 before age  61 years should receive another dose of PPSV23 vaccine at age 61 years or later if at least 5 years have passed since the previous dose. Doses of PPSV23 are not needed for people immunized with PPSV23  at or after age 33 years.  Meningococcal vaccine. Adults with asplenia or persistent complement component deficiencies should receive 2 doses of quadrivalent meningococcal conjugate (MenACWY-D) vaccine. The doses should be obtained at least 2 months apart. Microbiologists working with certain meningococcal bacteria, [employer], people at risk during an outbreak, and people who travel to or live in countries with a high rate of meningitis should be immunized. A first-year college student up through age 71 years who is living in a residence hall should receive a dose if she did not receive a dose on or after her 16th birthday. Adults who have certain high-risk conditions should receive one or more doses of vaccine.  Hepatitis A vaccine. Adults who wish to be protected from this disease, have certain high-risk conditions, work with hepatitis A-infected animals, work in hepatitis A research labs, or travel to or work in countries with a high rate of hepatitis A should be immunized. Adults who were previously unvaccinated and who anticipate close contact with an international adoptee during the first 60 days after arrival in the [place] from a country with a high rate of hepatitis A should be immunized.  Hepatitis B vaccine. Adults who wish to be protected from this disease, have certain high-risk conditions, may be exposed to blood or other infectious body fluids, are household contacts or sex partners of hepatitis B positive people, are clients or workers in certain care facilities, or travel to or work in countries with a high rate of hepatitis B should be immunized.  Haemophilus influenzae type b (Hib) vaccine. A previously unvaccinated person with asplenia or sickle cell disease or having a  scheduled splenectomy should receive 1 dose of Hib vaccine. Regardless of previous immunization, a recipient of a hematopoietic stem cell transplant should receive a 3-dose series 6-12 months after her successful transplant. Hib vaccine is not recommended for adults with HIV infection. Preventive Services / Frequency Ages 90 to 6 years  Blood pressure check.** / Every 3-5 years.  Lipid and cholesterol check.** / Every 5 years beginning at age 37.  Clinical breast exam.** / Every 3 years for women in their 66s and 78s.  BRCA-related cancer risk assessment.** / For women who have family members with a BRCA-related cancer (breast, ovarian, tubal, or peritoneal cancers).  Pap test.** / Every 2 years from ages 72 through 87. Every 3 years starting at age 86 through age 27 or 32 with a history of 3 consecutive normal Pap tests.  HPV screening.** / Every 3 years from ages 70 through ages 56 to 69 with a history of 3 consecutive normal Pap tests.  Hepatitis C blood test.** / For any individual with known risks for hepatitis C.  Skin self-exam. / Monthly.  Influenza vaccine. / Every year.  Tetanus, diphtheria, and acellular pertussis (Tdap, Td) vaccine.** / Consult your health care provider. Pregnant women should receive 1 dose of Tdap vaccine during each pregnancy. 1 dose of Td every 10 years.  Varicella vaccine.** / Consult your health care provider. Pregnant females who do not have evidence of immunity should receive the first dose after pregnancy.  HPV vaccine. / 3 doses over 6 months, if 58 and younger. The vaccine is not recommended for use in pregnant females. However, pregnancy testing is not needed before receiving a dose.  Measles, mumps, rubella (MMR) vaccine.** / You need at least 1 dose of MMR if you were born in 1957 or later. You may also need a 2nd dose. For females of  childbearing age, rubella immunity should be determined. If there is no evidence of immunity, females who are not  pregnant should be vaccinated. If there is no evidence of immunity, females who are pregnant should delay immunization until after pregnancy.  Pneumococcal 13-valent conjugate (PCV13) vaccine.** / Consult your health care provider.  Pneumococcal polysaccharide (PPSV23) vaccine.** / 1 to 2 doses if you smoke cigarettes or if you have certain conditions.  Meningococcal vaccine.** / 1 dose if you are age 80 to 44 years and a Market researcher living in a residence hall, or have one of several medical conditions, you need to get vaccinated against meningococcal disease. You may also need additional booster doses.  Hepatitis A vaccine.** / Consult your health care provider.  Hepatitis B vaccine.** / Consult your health care provider.  Haemophilus influenzae type b (Hib) vaccine.** / Consult your health care provider. Ages 21 to 33 years  Blood pressure check.** / Every year.  Lipid and cholesterol check.** / Every 5 years beginning at age 53 years.  Lung cancer screening. / Every year if you are aged 75-80 years and have a 30-pack-year history of smoking and currently smoke or have quit within the past 15 years. Yearly screening is stopped once you have quit smoking for at least 15 years or develop a health problem that would prevent you from having lung cancer treatment.  Clinical breast exam.** / Every year after age 65 years.  BRCA-related cancer risk assessment.** / For women who have family members with a BRCA-related cancer (breast, ovarian, tubal, or peritoneal cancers).  Mammogram.** / Every year beginning at age 4 years and continuing for as long as you are in good health. Consult with your health care provider.  Pap test.** / Every 3 years starting at age 34 years through age 60 or 9 years with a history of 3 consecutive normal Pap tests.  HPV screening.** / Every 3 years from ages 4 years through ages 10 to 16 years with a history of 3 consecutive normal Pap  tests.  Fecal occult blood test (FOBT) of stool. / Every year beginning at age 72 years and continuing until age 65 years. You may not need to do this test if you get a colonoscopy every 10 years.  Flexible sigmoidoscopy or colonoscopy.** / Every 5 years for a flexible sigmoidoscopy or every 10 years for a colonoscopy beginning at age 51 years and continuing until age 65 years.  Hepatitis C blood test.** / For all people born from [birth year] through 1965 and any individual with known risks for hepatitis C.  Skin self-exam. / Monthly.  Influenza vaccine. / Every year.  Tetanus, diphtheria, and acellular pertussis (Tdap/Td) vaccine.** / Consult your health care provider. Pregnant women should receive 1 dose of Tdap vaccine during each pregnancy. 1 dose of Td every 10 years.  Varicella vaccine.** / Consult your health care provider. Pregnant females who do not have evidence of immunity should receive the first dose after pregnancy.  Zoster vaccine.** / 1 dose for adults aged 34 years or older.  Measles, mumps, rubella (MMR) vaccine.** / You need at least 1 dose of MMR if you were born in 1957 or later. You may also need a second dose. For females of childbearing age, rubella immunity should be determined. If there is no evidence of immunity, females who are not pregnant should be vaccinated. If there is no evidence of immunity, females who are pregnant should delay immunization until after pregnancy.  Pneumococcal 13-valent conjugate (  PCV13) vaccine.** / Consult your health care provider.  Pneumococcal polysaccharide (PPSV23) vaccine.** / 1 to 2 doses if you smoke cigarettes or if you have certain conditions.  Meningococcal vaccine.** / Consult your health care provider.  Hepatitis A vaccine.** / Consult your health care provider.  Hepatitis B vaccine.** / Consult your health care provider.  Haemophilus influenzae type b (Hib) vaccine.** / Consult your health care provider. Ages 1 years and  over  Blood pressure check.** / Every year.  Lipid and cholesterol check.** / Every 5 years beginning at age 4 years.  Lung cancer screening. / Every year if you are aged 47-80 years and have a 30-pack-year history of smoking and currently smoke or have quit within the past 15 years. Yearly screening is stopped once you have quit smoking for at least 15 years or develop a health problem that would prevent you from having lung cancer treatment.  Clinical breast exam.** / Every year after age 79 years.  BRCA-related cancer risk assessment.** / For women who have family members with a BRCA-related cancer (breast, ovarian, tubal, or peritoneal cancers).  Mammogram.** / Every year beginning at age 44 years and continuing for as long as you are in good health. Consult with your health care provider.  Pap test.** / Every 3 years starting at age 38 years through age 66 or 83 years with 3 consecutive normal Pap tests. Testing can be stopped between 65 and 70 years with 3 consecutive normal Pap tests and no abnormal Pap or HPV tests in the past 10 years.  HPV screening.** / Every 3 years from ages 71 years through ages 47 or 11 years with a history of 3 consecutive normal Pap tests. Testing can be stopped between 65 and 70 years with 3 consecutive normal Pap tests and no abnormal Pap or HPV tests in the past 10 years.  Fecal occult blood test (FOBT) of stool. / Every year beginning at age 60 years and continuing until age 80 years. You may not need to do this test if you get a colonoscopy every 10 years.  Flexible sigmoidoscopy or colonoscopy.** / Every 5 years for a flexible sigmoidoscopy or every 10 years for a colonoscopy beginning at age 53 years and continuing until age 26 years.  Hepatitis C blood test.** / For all people born from [birth year] through 1965 and any individual with known risks for hepatitis C.  Osteoporosis screening.** / A one-time screening for women ages 28 years and over and women  at risk for fractures or osteoporosis.  Skin self-exam. / Monthly.  Influenza vaccine. / Every year.  Tetanus, diphtheria, and acellular pertussis (Tdap/Td) vaccine.** / 1 dose of Td every 10 years.  Varicella vaccine.** / Consult your health care provider.  Zoster vaccine.** / 1 dose for adults aged 61 years or older.  Pneumococcal 13-valent conjugate (PCV13) vaccine.** / Consult your health care provider.  Pneumococcal polysaccharide (PPSV23) vaccine.** / 1 dose for all adults aged 72 years and older.  Meningococcal vaccine.** / Consult your health care provider.  Hepatitis A vaccine.** / Consult your health care provider.  Hepatitis B vaccine.** / Consult your health care provider.  Haemophilus influenzae type b (Hib) vaccine.** / Consult your health care provider. ** Family history and personal history of risk and conditions may change your health care provider's recommendations.   This information is not intended to replace advice given to you by your health care provider. Make sure you discuss any questions you have with your health  care provider.   Document Released: 03/18/2001 Document Revised: 02/10/2014 Document Reviewed: 06/17/2010 Elsevier Interactive Patient Education [employer].

## 2015-02-13 ENCOUNTER — Other Ambulatory Visit: Payer: Self-pay | Admitting: Family Medicine

## 2015-02-13 LAB — HIV ANTIBODY (ROUTINE TESTING W REFLEX): HIV 1&2 Ab, 4th Generation: NONREACTIVE

## 2015-02-13 LAB — GC/CHLAMYDIA PROBE AMP
CT PROBE, AMP APTIMA: DETECTED — AB
GC Probe RNA: NOT DETECTED

## 2015-02-13 MED ORDER — AZITHROMYCIN 500 MG PO TABS: 1000.0000 mg | ORAL_TABLET | Freq: Once | ORAL | Status: DC

## 2015-02-16 ENCOUNTER — Telehealth: Payer: Self-pay | Admitting: Internal Medicine

## 2015-02-16 NOTE — Telephone Encounter (Signed)
Agree with above 

## 2015-02-16 NOTE — Telephone Encounter (Signed)
[facility] - Client TELEPHONE ADVICE RECORD [facility]  Patient Name: Brittany Simon  DOB: 1995-06-05    Initial Comment caller states her abx pill dissolved in her mouth, will it still be effective   Nurse Assessment  Nurse: Izola PriceMyers, RN, Cala BradfordKimberly Date/Time (Eastern Time): 02/16/2015 3:04:07 PM  Confirm and document reason for call. If symptomatic, describe symptoms. You must click the next button to save text entered. ---caller states her abx pill dissolved in her mouth, will it still be effective. Medication was azithromycin. took first pill and second one didn't go down and pt just had to continue to drink water until it dissolved in mouth.  Has the patient traveled out of the country within the last 30 days? ---Not Applicable  Does the patient have any new or worsening symptoms? ---No  Please document clinical information provided and list any resource used. ---Nurse advised pt that it is ok that pill dissolved and wasn't swallowed. Pt didn't feel nauseous afterward. Pt stated understanding.     Guidelines    Guideline Title Affirmed Question Affirmed Notes       Final Disposition User   Clinical Call Izola PriceMyers, RN, Cala BradfordKimberly

## 2015-11-02 ENCOUNTER — Ambulatory Visit (INDEPENDENT_AMBULATORY_CARE_PROVIDER_SITE_OTHER): Payer: BLUE CROSS/BLUE SHIELD | Admitting: Family Medicine

## 2015-11-02 DIAGNOSIS — Z23 Encounter for immunization: Secondary | ICD-10-CM

## 2016-05-20 NOTE — Progress Notes (Signed)
Chief Complaint  Patient presents with  . Allergies    HPI: Brittany Simon 21 y.o. sda   Bad year  Of allergies   Pollen for her is worse for her .  Sx sneezing itch eyes and  Runny nose    Cough and wheezing and sob.  taking benadryl for a few days    Some help. Rhinorrhea    Day Claritin  For last 3-4 days.  Haven started flonase yet  Usually helpful   2 weeks   Of cough .   Worse at night.   Remote use of  inhaler and singulair No tobacco   No pets.     Hx of cough and inhaler in allergies  .    ROS: See pertinent positives and negatives per HPI. No feer chills   Sob now  Sx worse at night   Past Medical History:  Diagnosis Date  . Allergy   . Asthma   . Prematurity, fetus 35-36 completed weeks of gestation     4 18z 36 weeks    Family History  Problem Relation Age of Onset  . Asthma Father     Social History   Social History  . Marital status: Single    Spouse name: N/A  . Number of children: N/A  . Years of education: N/A   Social History Main Topics  . Smoking status: Never Smoker  . Smokeless tobacco: Never Used  . Alcohol use None  . Drug use: Unknown  . Sexual activity: Not Asked   Other Topics Concern  . None   Social History Narrative   Single   Orig [facility] of 4  One dog      IB program.    [employer].   [employer] fall 15   [employer]    Education major, spanish minor     Outpatient Medications Prior to Visit  Medication Sig Dispense Refill  . fluticasone (FLONASE) 50 MCG/ACT nasal spray 2 spray each nostril qd (Patient not taking: Reported on 05/21/2016) 16 g 3  . albuterol (PROAIR HFA) 108 (90 BASE) MCG/ACT inhaler Inhale 2 puffs into the lungs every 6 (six) hours as needed for wheezing. (Patient not taking: Reported on 05/21/2016) 1 Inhaler 0  . azithromycin (ZITHROMAX) 500 MG tablet Take 2 tablets (1,000 mg total) by mouth once. 2 tablet 0  . fexofenadine (ALLEGRA) 60 MG tablet Take 60 mg by mouth daily.     No  facility-administered medications prior to visit.      EXAM:  BP 100/80 (BP Location: Right Arm, Patient Position: Sitting, Cuff Size: Normal)   Pulse 99   Temp 98.5 F (36.9 C) (Oral)   Ht  (1.6 m)   Wt 149 lb 6.4 oz (67.8 kg)   BMI 26.47 kg/m   Body mass index is 26.47 kg/m.  GENERAL: vitals reviewed and listed above, alert, oriented, appears well hydrated and in no acute distress very congested   Looks allergic   Dry cough ocass  HEENT: atraumatic, conjunctiva  Clear watery, no obvious abnormalities on inspection of external nose and ears tmx clear nares boggy  Clear dc OP : no lesion edema or exudate  Face non tender  NECK: no obvious adenopathy  on inspection palpation  LUNGS: clear to auscultation bilaterally, no wheezes, rales or rhonchi, good air movement CV: HRRR, no clubbing cyanosis or  peripheral edema nl cap refill  MS: moves all extremities without noticeable focal  abnormality PSYCH: pleasant and cooperative, no obvious depression or anxiety  ASSESSMENT AND PLAN:  Discussed the following assessment and plan:  Seasonal allergic rhinitis, unspecified trigger - moderately severe  Allergic cough  History of asthma Expectant management  Significant sx    Begin triple therapy   Contact if not helping  . Consider short pred if needed but at this time  Risk more than benefit. Counseled.  -Patient advised to return or notify health care team  if symptoms worsen ,persist or new concerns arise.  Patient Instructions  Triple therapy   Antihistamine of your choice .   Nasal cortisone is the most important of the medication to prevent  Ongoing sx .   flonase or other 2 sprays  Both nostrils   Twice a day to begin and then daily  Through the season.    Add daily Singulair for allergy asthma sx suppression throught the season.  It may take a week  And more to get maximum improvement  contact us if not  Working or fever other concerns.     Neta Mends. Panosh  M.D.

## 2016-05-21 ENCOUNTER — Encounter: Payer: Self-pay | Admitting: Internal Medicine

## 2016-05-21 ENCOUNTER — Ambulatory Visit (INDEPENDENT_AMBULATORY_CARE_PROVIDER_SITE_OTHER): Payer: BLUE CROSS/BLUE SHIELD | Admitting: Internal Medicine

## 2016-05-21 VITALS — BP 100/80 | HR 99 | Temp 98.5°F | Ht 63.0 in | Wt 149.4 lb

## 2016-05-21 DIAGNOSIS — R05 Cough: Secondary | ICD-10-CM

## 2016-05-21 DIAGNOSIS — J302 Other seasonal allergic rhinitis: Secondary | ICD-10-CM | POA: Diagnosis not present

## 2016-05-21 DIAGNOSIS — Z8709 Personal history of other diseases of the respiratory system: Secondary | ICD-10-CM

## 2016-05-21 DIAGNOSIS — R058 Other specified cough: Secondary | ICD-10-CM

## 2016-05-21 MED ORDER — MONTELUKAST SODIUM 10 MG PO TABS: 10.0000 mg | ORAL_TABLET | Freq: Every day | ORAL | 3 refills | Status: DC

## 2016-05-21 MED ORDER — ALBUTEROL SULFATE HFA 108 (90 BASE) MCG/ACT IN AERS: 2.0000 | INHALATION_SPRAY | Freq: Four times a day (QID) | RESPIRATORY_TRACT | 0 refills | Status: DC | PRN

## 2016-05-21 NOTE — Patient Instructions (Signed)
Triple therapy   Antihistamine of your choice .   Nasal cortisone is the most important of the medication to prevent  Ongoing sx .   flonase or other 2 sprays  Both nostrils   Twice a day to begin and then daily  Through the season.    Add daily Singulair for allergy asthma sx suppression throught the season.  It may take a week  And more to get maximum improvement  contact us if not  Working or fever other concerns.

## 2017-01-08 ENCOUNTER — Encounter: Payer: Self-pay | Admitting: Family Medicine

## 2017-01-08 ENCOUNTER — Ambulatory Visit (INDEPENDENT_AMBULATORY_CARE_PROVIDER_SITE_OTHER): Payer: BLUE CROSS/BLUE SHIELD | Admitting: Family Medicine

## 2017-01-08 VITALS — BP 120/80 | HR 73 | Temp 97.7°F | Ht 62.75 in | Wt 146.3 lb

## 2017-01-08 DIAGNOSIS — F329 Major depressive disorder, single episode, unspecified: Secondary | ICD-10-CM | POA: Diagnosis not present

## 2017-01-08 DIAGNOSIS — F419 Anxiety disorder, unspecified: Secondary | ICD-10-CM

## 2017-01-08 DIAGNOSIS — Z23 Encounter for immunization: Secondary | ICD-10-CM

## 2017-01-08 DIAGNOSIS — Z0001 Encounter for general adult medical examination with abnormal findings: Secondary | ICD-10-CM

## 2017-01-08 DIAGNOSIS — F32A Depression, unspecified: Secondary | ICD-10-CM

## 2017-01-08 NOTE — Patient Instructions (Addendum)
Preventive Care 18-39 Years, Female Preventive care refers to lifestyle choices and visits with your health care provider that can promote health and wellness. What does preventive care include?  A yearly physical exam. This is also called an annual well check.  Dental exams once or twice a year.  Routine eye exams. Ask your health care provider how often you should have your eyes checked.  Personal lifestyle choices, including: ? Daily care of your teeth and gums. ? Regular physical activity. ? Eating a healthy diet. ? Avoiding tobacco and drug use. ? Limiting alcohol use. ? Practicing safe sex. ? Taking vitamin and mineral supplements as recommended by your health care provider. What happens during an annual well check? The services and screenings done by your health care provider during your annual well check will depend on your age, overall health, lifestyle risk factors, and family history of disease. Counseling Your health care provider may ask you questions about your:  Alcohol use.  Tobacco use.  Drug use.  Emotional well-being.  Home and relationship well-being.  Sexual activity.  Eating habits.  Work and work Statistician.  Method of birth control.  Menstrual cycle.  Pregnancy history.  Screening You may have the following tests or measurements:  Height, weight, and BMI.  Diabetes screening. This is done by checking your blood sugar (glucose) after you have not eaten for a while (fasting).  Blood pressure.  Lipid and cholesterol levels. These may be checked every 5 years starting at age 66.  Skin check.  Hepatitis C blood test.  Hepatitis B blood test.  Sexually transmitted disease (STD) testing.  BRCA-related cancer screening. This may be done if you have a family history of breast, ovarian, tubal, or peritoneal cancers.  Pelvic exam and Pap test. This may be done every 3 years starting at age 40. Starting at age 59, this may be done every 5  years if you have a Pap test in combination with an HPV test.  Discuss your test results, treatment options, and if necessary, the need for more tests with your health care provider. Vaccines Your health care provider may recommend certain vaccines, such as:  Influenza vaccine. This is recommended every year.  Tetanus, diphtheria, and acellular pertussis (Tdap, Td) vaccine. You may need a Td booster every 10 years.  Varicella vaccine. You may need this if you have not been vaccinated.  HPV vaccine. If you are 69 or younger, you may need three doses over 6 months.  Measles, mumps, and rubella (MMR) vaccine. You may need at least one dose of MMR. You may also need a second dose.  Pneumococcal 13-valent conjugate (PCV13) vaccine. You may need this if you have certain conditions and were not previously vaccinated.  Pneumococcal polysaccharide (PPSV23) vaccine. You may need one or two doses if you smoke cigarettes or if you have certain conditions.  Meningococcal vaccine. One dose is recommended if you are age 27-21 years and a first-year college student living in a residence hall, or if you have one of several medical conditions. You may also need additional booster doses.  Hepatitis A vaccine. You may need this if you have certain conditions or if you travel or work in places where you may be exposed to hepatitis A.  Hepatitis B vaccine. You may need this if you have certain conditions or if you travel or work in places where you may be exposed to hepatitis B.  Haemophilus influenzae type b (Hib) vaccine. You may need this if  you have certain risk factors.  Talk to your health care provider about which screenings and vaccines you need and how often you need them. This information is not intended to replace advice given to you by your health care provider. Make sure you discuss any questions you have with your health care provider. Document Released: 03/18/2001 Document Revised: 10/10/2015  Document Reviewed: 11/21/2014 Elsevier Interactive Patient Education  2017 [place] With Depression Everyone experiences occasional disappointment, sadness, and loss in their lives. When you are feeling down, blue, or sad for at least 2 weeks in a row, it may mean that you have depression. Depression can affect your thoughts and feelings, relationships, daily activities, and physical health. It is caused by changes in the way your brain functions. If you receive a diagnosis of depression, your health care provider will tell you which type of depression you have and what treatment options are available to you. If you are living with depression, there are ways to help you recover from it and also ways to prevent it from coming back. How to cope with lifestyle changes Coping with stress Stress is your body's reaction to life changes and events, both good and bad. Stressful situations may include:  Getting married.  The death of a spouse.  Losing a job.  Retiring.  Having a baby.  Stress can last just a few hours or it can be ongoing. Stress can play a major role in depression, so it is important to learn both how to cope with stress and how to think about it differently. Talk with your health care provider or a counselor if you would like to learn more about stress reduction. He or she may suggest some stress reduction techniques, such as:  Music therapy. This can include creating music or listening to music. Choose music that you enjoy and that inspires you.  Mindfulness-based meditation. This kind of meditation can be done while sitting or walking. It involves being aware of your normal breaths, rather than trying to control your breathing.  Centering prayer. This is a kind of meditation that involves focusing on a spiritual word or phrase. Choose a word, phrase, or sacred image that is meaningful to you and that brings you peace.  Deep breathing. To do this, expand your  stomach and inhale slowly through your nose. Hold your breath for 3-5 seconds, then exhale slowly, allowing your stomach muscles to relax.  Muscle relaxation. This involves intentionally tensing muscles then relaxing them.  Choose a stress reduction technique that fits your lifestyle and personality. Stress reduction techniques take time and practice to develop. Set aside 5-15 minutes a day to do them. Therapists can offer training in these techniques. The training may be covered by some insurance plans. Other things you can do to manage stress include:  Keeping a stress diary. This can help you learn what triggers your stress and ways to control your response.  Understanding what your limits are and saying no to requests or events that lead to a schedule that is too full.  Thinking about how you respond to certain situations. You may not be able to control everything, but you can control how you react.  Adding humor to your life by watching funny films or TV shows.  Making time for activities that help you relax and not feeling guilty about spending your time this way.  Medicines Your health care provider may suggest certain medicines if he or she feels that they will help  improve your condition. Avoid using alcohol and other substances that may prevent your medicines from working properly (may interact). It is also important to:  Talk with your pharmacist or health care provider about all the medicines that you take, their possible side effects, and what medicines are safe to take together.  Make it your goal to take part in all treatment decisions (shared decision-making). This includes giving input on the side effects of medicines. It is best if shared decision-making with your health care provider is part of your total treatment plan.  If your health care provider prescribes a medicine, you may not notice the full benefits of it for 4-8 weeks. Most people who are treated for depression  need to be on medicine for at least 6-12 months after they feel better. If you are taking medicines as part of your treatment, do not stop taking medicines without first talking to your health care provider. You may need to have the medicine slowly decreased (tapered) over time to decrease the risk of harmful side effects. Relationships Your health care provider may suggest family therapy along with individual therapy and drug therapy. While there may not be family problems that are causing you to feel depressed, it is still important to make sure your family learns as much as they can about your mental health. Having your family's support can help make your treatment successful. How to recognize changes in your condition Everyone has a different response to treatment for depression. Recovery from major depression happens when you have not had signs of major depression for two months. This may mean that you will start to:  Have more interest in doing activities.  Feel less hopeless than you did 2 months ago.  Have more energy.  Overeat less often, or have better or improving appetite.  Have better concentration.  Your health care provider will work with you to decide the next steps in your recovery. It is also important to recognize when your condition is getting worse. Watch for these signs:  Having fatigue or low energy.  Eating too much or too little.  Sleeping too much or too little.  Feeling restless, agitated, or hopeless.  Having trouble concentrating or making decisions.  Having unexplained physical complaints.  Feeling irritable, angry, or aggressive.  Get help as soon as you or your family members notice these symptoms coming back. How to get support and help from others How to talk with friends and family members about your condition Talking to friends and family members about your condition can provide you with one way to get support and guidance. Reach out to trusted  friends or family members, explain your symptoms to them, and let them know that you are working with a health care provider to treat your depression. Financial resources Not all insurance plans cover mental health care, so it is important to check with your insurance carrier. If paying for co-pays or counseling services is a problem, search for a local or county mental health care center. They may be able to offer public mental health care services at low or no cost when you are not able to see a private health care provider. If you are taking medicine for depression, you may be able to get the generic form, which may be less expensive. Some makers of prescription medicines also offer help to patients who cannot afford the medicines they need. Follow these instructions at home:  Get the right amount and quality of sleep.  Cut  down on using caffeine, tobacco, alcohol, and other potentially harmful substances.  Try to exercise, such as walking or lifting small weights.  Take over-the-counter and prescription medicines only as told by your health care provider.  Eat a healthy diet that includes plenty of vegetables, fruits, whole grains, low-fat dairy products, and lean protein. Do not eat a lot of foods that are high in solid fats, added sugars, or salt.  Keep all follow-up visits as told by your health care provider. This is important. Contact a health care provider if:  You stop taking your antidepressant medicines, and you have any of these symptoms: ? Nausea. ? Headache. ? Feeling lightheaded. ? Chills and body aches. ? Not being able to sleep (insomnia).  You or your friends and family think your depression is getting worse. Get help right away if:  You have thoughts of hurting yourself or others. If you ever feel like you may hurt yourself or others, or have thoughts about taking your own life, get help right away. You can go to your nearest emergency department or call:  Your  local emergency services (911 in the [place]  A suicide crisis helpline, such as the [facility] at 475-419-7428. This is open 24-hours a day.  Summary  If you are living with depression, there are ways to help you recover from it and also ways to prevent it from coming back.  Work with your health care team to create a management plan that includes counseling, stress management techniques, and healthy lifestyle habits. This information is not intended to replace advice given to you by your health care provider. Make sure you discuss any questions you have with your health care provider. Document Released: 12/24/2015 Document Revised: 12/24/2015 Document Reviewed: 12/24/2015 Elsevier Interactive Patient Education  [employer].

## 2017-01-08 NOTE — Progress Notes (Signed)
Subjective:     Brittany Simon is an 21 y.o. female who presents for CPE. She denies any current medical problems. Pt endorses feeling down x 1 yr.  Pt endorses mood "not very good", no energy- "going to work and class b/c I have to", poor sleep.  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  Patient states she enjoys working with children and enjoys the teacher she is working with.  Pt states her roommate (how has depression) thinks she is depressed.  Questionnaire and forms reviewed with patient and responses verified. Immunizations are up to date. Patient has received 2 doses of MMR vaccine. Varicella immunity: confirmed by vaccine administration.  The following portions of the patient's history were reviewed and updated as appropriate: allergies, current medications, past family history, past medical history, past social history, past surgical history and problem list.  Review of Systems Pertinent items are noted in HPI.     Objective:    BP 120/80 (BP Location: Right Arm, Patient Position: Sitting, Cuff Size: Normal)   Pulse 73   Temp 97.7 F (36.5 C) (Oral)   Ht 5' 2.75" (1.594 m)   Wt 146 lb 4.8 oz (66.4 kg)   BMI 26.12 kg/m  Vision tested.  Both 20/16, right 20/16, left 20/16  General Appearance:    Alert, cooperative, no distress, appears stated age  Head:    Normocephalic, without obvious abnormality, atraumatic  Eyes:    PERRL, conjunctiva/corneas clear, EOM's intact  Ears:    Normal TM's and external ear canals, both ears  Nose:   Nares normal, septum midline, mucosa normal, no drainage    or sinus tenderness  Throat:   Lips, mucosa, and tongue normal; teeth and gums normal  Neck:   Supple, symmetrical, trachea midline, mild cercical lymphadenopathy; thyroid:  No enlargement/tenderness/nodules; no carotid   bruit or JVD  Lungs:     Clear to auscultation bilaterally, respirations unlabored  Chest Wall:    No tenderness or  deformity   Heart:    Regular rate and rhythm, S1 and S2 normal, no murmur, rub   or gallop  Abdomen:     Soft, non-tender, bowel sounds active all four quadrants,    no masses, no organomegaly  Extremities:   Extremities normal, atraumatic, no cyanosis or edema  Pulses:   2+ and symmetric all extremities  Skin:   Skin color, texture, turgor normal, no rashes or lesions  Neurologic:   CNII-XII intact, normal strength, sensation and reflexes    throughout      Assessment:    Physical exam with abnormal findings.  Pt appears clinically depressed.      Plan:    1. Completed and returned forms.  Will sign patient's physical form for [facility] system after she has PPD placed and read.  Patient planning to schedule appointment next week for this.  2. Reviewed health maintenance, contraception, STDs, and substance abuse.  3.TD booster given  4. PHQ-9 score 12--moderate depression.  Will obtain tsh and t4.  Discussed starting medication and possible therapy.  Patient wishes to decide.    5. Follow up in 1 month advised.

## 2017-01-09 LAB — CBC WITH DIFFERENTIAL/PLATELET
BASOS ABS: 0.1 10*3/uL (ref 0.0–0.1)
Basophils Relative: 1.4 % (ref 0.0–3.0)
EOS ABS: 0.1 10*3/uL (ref 0.0–0.7)
Eosinophils Relative: 1 % (ref 0.0–5.0)
HEMATOCRIT: 36.8 % (ref 36.0–46.0)
HEMOGLOBIN: 11.8 g/dL — AB (ref 12.0–15.0)
LYMPHS PCT: 32.1 % (ref 12.0–46.0)
Lymphs Abs: 2.8 10*3/uL (ref 0.7–4.0)
MCHC: 31.9 g/dL (ref 30.0–36.0)
MCV: 80 fl (ref 78.0–100.0)
Monocytes Absolute: 0.6 10*3/uL (ref 0.1–1.0)
Monocytes Relative: 7.1 % (ref 3.0–12.0)
Neutro Abs: 5.1 10*3/uL (ref 1.4–7.7)
Neutrophils Relative %: 58.4 % (ref 43.0–77.0)
Platelets: 321 10*3/uL (ref 150.0–400.0)
RBC: 4.6 Mil/uL (ref 3.87–5.11)
RDW: 14.9 % (ref 11.5–15.5)
WBC: 8.7 10*3/uL (ref 4.0–10.5)

## 2017-01-09 LAB — BASIC METABOLIC PANEL
BUN: 10 mg/dL (ref 6–23)
CHLORIDE: 101 meq/L (ref 96–112)
CO2: 27 mEq/L (ref 19–32)
Calcium: 9.5 mg/dL (ref 8.4–10.5)
Creatinine, Ser: 0.54 mg/dL (ref 0.40–1.20)
GFR: 182.63 mL/min (ref >60.00)
GLUCOSE: 74 mg/dL (ref 70–99)
POTASSIUM: 3.7 meq/L (ref 3.5–5.1)
Sodium: 137 mEq/L (ref 135–145)

## 2017-01-09 LAB — T4, FREE: FREE T4: 0.99 ng/dL (ref 0.60–1.60)

## 2017-01-09 LAB — TSH: TSH: 1.58 u[IU]/mL (ref 0.35–4.50)

## 2017-01-19 ENCOUNTER — Ambulatory Visit: Payer: BLUE CROSS/BLUE SHIELD

## 2017-09-09 ENCOUNTER — Ambulatory Visit: Payer: BLUE CROSS/BLUE SHIELD

## 2017-09-14 ENCOUNTER — Ambulatory Visit: Payer: BLUE CROSS/BLUE SHIELD

## 2017-09-14 ENCOUNTER — Ambulatory Visit (INDEPENDENT_AMBULATORY_CARE_PROVIDER_SITE_OTHER): Payer: BLUE CROSS/BLUE SHIELD | Admitting: Family Medicine

## 2017-09-14 DIAGNOSIS — Z23 Encounter for immunization: Secondary | ICD-10-CM | POA: Diagnosis not present

## 2017-12-14 NOTE — Progress Notes (Deleted)
No chief complaint on file.   HPI: Patient  Brittany Simon  22 y.o. comes in today for Preventive Health Care visit   Health Maintenance  Topic Date Due  . PAP SMEAR  08/28/2016  . INFLUENZA VACCINE  09/03/2017  . TETANUS/TDAP  01/09/2027  . HIV Screening  Completed   Health Maintenance Review LIFESTYLE:  Exercise:   Tobacco/ETS: Alcohol:  Sugar beverages: Sleep: Drug use: no HH of  Work:    ROS:  GEN/ HEENT: No fever, significant weight changes sweats headaches vision problems hearing changes, CV/ PULM; No chest pain shortness of breath cough, syncope,edema  change in exercise tolerance. GI /GU: No adominal pain, vomiting, change in bowel habits. No blood in the stool. No significant GU symptoms. SKIN/HEME: ,no acute skin rashes suspicious lesions or bleeding. No lymphadenopathy, nodules, masses.  NEURO/ PSYCH:  No neurologic signs such as weakness numbness. No depression anxiety. IMM/ Allergy: No unusual infections.  Allergy .   REST of 12 system review negative except as per HPI   Past Medical History:  Diagnosis Date  . Allergy   . Asthma   . Prematurity, fetus 35-36 completed weeks of gestation     4 40z 36 weeks    No past surgical history on file.  Family History  Problem Relation Age of Onset  . Asthma Father     Social History   Socioeconomic History  . Marital status: Single    Spouse name: Not on file  . Number of children: Not on file  . Years of education: Not on file  . Highest education level: Not on file  Occupational History  . Not on file  Social Needs  . Financial resource strain: Not on file  . Food insecurity:    Worry: Not on file    Inability: Not on file  . Transportation needs:    Medical: Not on file    Non-medical: Not on file  Tobacco Use  . Smoking status: Never Smoker  . Smokeless tobacco: Never Used  Substance and Sexual Activity  . Alcohol use: Yes    Comment: occassionally   . Drug use: No  . Sexual  activity: Not on file  Lifestyle  . Physical activity:    Days per week: Not on file    Minutes per session: Not on file  . Stress: Not on file  Relationships  . Social connections:    Talks on phone: Not on file    Gets together: Not on file    Attends religious service: Not on file    Active member of club or organization: Not on file    Attends meetings of clubs or organizations: Not on file    Relationship status: Not on file  Other Topics Concern  . Not on file  Social History Narrative   Single   Orig from [facility] of [facility]  One dog      IB program.    [name].   [employer] fall 65   [employer]    Education major, spanish minor     Outpatient Medications Prior to Visit  Medication Sig Dispense Refill  . albuterol (PROAIR HFA) 108 (90 Base) MCG/ACT inhaler Inhale 2 puffs into the lungs every 6 (six) hours as needed for wheezing. (Patient not taking: Reported on 01/08/2017) 1 Inhaler 0  . fluticasone (FLONASE) 50 MCG/ACT nasal spray 2 spray each nostril qd (Patient not taking: Reported on 05/21/2016) 16 g 3  .  montelukast (SINGULAIR) 10 MG tablet Take 1 tablet (10 mg total) by mouth at bedtime. (Patient not taking: Reported on 01/08/2017) 30 tablet 3   No facility-administered medications prior to visit.      EXAM:  There were no vitals taken for this visit.  There is no height or weight on file to calculate BMI. Wt Readings from Last 3 Encounters:  01/08/17 146 lb 4.8 oz (66.4 kg)  05/21/16 149 lb 6.4 oz (67.8 kg)  02/12/15 145 lb (65.8 kg) (76 %, Z= 0.71)*   * Growth percentiles are based on CDC (Girls, 2-20 Years) data.    Physical Exam: Vital signs reviewed [id number] is a well-developed well-nourished alert cooperative    who appearsr stated age in no acute distress.  HEENT: normocephalic atraumatic , Eyes: PERRL EOM's full, conjunctiva clear, Nares: paten,t no deformity discharge or tenderness., Ears: no deformity EAC's clear TMs with normal landmarks. Mouth:  clear OP, no lesions, edema.  Moist mucous membranes. Dentition in adequate repair. NECK: supple without masses, thyromegaly or bruits. CHEST/PULM:  Clear to auscultation and percussion breath sounds equal no wheeze , rales or rhonchi. No chest wall deformities or tenderness. Breast: normal by inspection . No dimpling, discharge, masses, tenderness or discharge . CV: PMI is nondisplaced, S1 S2 no gallops, murmurs, rubs. Peripheral pulses are full without delay.No JVD .  ABDOMEN: Bowel sounds normal nontender  No guard or rebound, no hepato splenomegal no CVA tenderness.  No hernia. Extremtities:  No clubbing cyanosis or edema, no acute joint swelling or redness no focal atrophy NEURO:  Oriented x3, cranial nerves 3-12 appear to be intact, no obvious focal weakness,gait within normal limits no abnormal reflexes or asymmetrical SKIN: No acute rashes normal turgor, color, no bruising or petechiae. PSYCH: Oriented, good eye contact, no obvious depression anxiety, cognition and judgment appear normal. LN: no cervical axillary inguinal adenopathy  Lab Results  Component Value Date   WBC 8.7 01/08/2017   HGB 11.8 (L) 01/08/2017   HCT 36.8 01/08/2017   PLT 321.0 01/08/2017   GLUCOSE 74 01/08/2017   CHOL 155 07/08/2013   TRIG 61 07/08/2013   HDL 57 07/08/2013   LDLCALC 86 07/08/2013   ALT 9 07/08/2013   AST 13 07/08/2013   NA 137 01/08/2017   K 3.7 01/08/2017   CL 101 01/08/2017   CREATININE 0.54 01/08/2017   BUN 10 01/08/2017   CO2 27 01/08/2017   TSH 1.58 01/08/2017    BP Readings from Last 3 Encounters:  01/08/17 120/80  05/21/16 100/80  02/12/15 120/84    Lab results reviewed with patient   ASSESSMENT AND PLAN:  Discussed the following assessment and plan:  Visit for preventive health examination  Patient Care Team: Burnis Medin, MD as PCP - General Patient Instructions     Preventive Care for [place], Female The transition to life after high school as a  young adult can be a stressful time with many changes. You may start seeing a primary care physician instead of a pediatrician. This is the time when your health care becomes your responsibility. Preventive care refers to lifestyle choices and visits with your health care provider that can promote health and wellness. What does preventive care include?  A yearly physical exam. This is also called an annual wellness visit.  Dental exams once or twice a year.  Routine eye exams. Ask your health care provider how often you should have your eyes checked.  Personal lifestyle choices, including: ? Daily  care of your teeth and gums. ? Regular physical activity. ? Eating a healthy diet. ? Avoiding tobacco and drug use. ? Avoiding or limiting alcohol use. ? Practicing safe sex. ? Taking vitamin and mineral supplements as recommended by your health care provider. What happens during an annual wellness visit? Preventive care starts with a yearly visit to your primary care physician. The services and screenings done by your health care provider during your annual wellness visit will depend on your overall health, lifestyle risk factors, and family history of disease. Counseling Your health care provider may ask you questions about:  Past medical problems and your family's medical history.  Medicines or supplements you take.  Health insurance and access to health care.  Alcohol, tobacco, and drug use.  Your safety at home, work, or school.  Access to firearms.  Emotional well-being and how you cope with stress.  Relationship well-being.  Diet, exercise, and sleep habits.  Your sexual health and activity.  Your methods of birth control.  Your menstrual cycle.  Your pregnancy history.  Screening You may have the following tests or measurements:  Height, weight, and BMI.  Blood pressure.  Lipid and cholesterol levels.  Tuberculosis skin test.  Skin exam.  Vision and  hearing tests.  Screening test for hepatitis.  Screening tests for sexually transmitted diseases (STDs), if you are at risk.  BRCA-related cancer screening. This may be done if you have a family history of breast, ovarian, tubal, or peritoneal cancers.  Pelvic exam and Pap test. This may be done every 3 years starting at age 16.  Vaccines Your health care provider may recommend certain vaccines, such as:  Influenza vaccine. This is recommended every year.  Tetanus, diphtheria, and acellular pertussis (Tdap, Td) vaccine. You may need a Td booster every 10 years.  Varicella vaccine. You may need this if you have not been vaccinated.  HPV vaccine. If you are 53 or younger, you may need three doses over 6 months.  Measles, mumps, and rubella (MMR) vaccine. You may need at least one dose of MMR. You may also need a second dose.  Pneumococcal 13-valent conjugate (PCV13) vaccine. You may need this if you have certain conditions and were not previously vaccinated.  Pneumococcal polysaccharide (PPSV23) vaccine. You may need one or two doses if you smoke cigarettes or if you have certain conditions.  Meningococcal vaccine. One dose is recommended if you are age 25-21 years and a first-year college student living in a residence hall, or if you have one of several medical conditions. You may also need additional booster doses.  Hepatitis A vaccine. You may need this if you have certain conditions or if you travel or work in places where you may be exposed to hepatitis A.  Hepatitis B vaccine. You may need this if you have certain conditions or if you travel or work in places where you may be exposed to hepatitis B.  Haemophilus influenzae type b (Hib) vaccine. You may need this if you have certain risk factors.  Talk to your health care provider about which screenings and vaccines you need and how often you need them. What steps can I take to develop healthy behaviors?  Have regular  preventive health care visits with your primary care physician and dentist.  Eat a healthy diet.  Drink enough fluid to keep your urine clear or pale yellow.  Stay active. Exercise at least 30 minutes 5 or more days of the week.  Use alcohol responsibly.  Maintain a healthy weight.  Do not use any products that contain nicotine, such as cigarettes, chewing tobacco, and e-cigarettes. If you need help quitting, ask your health care provider.  Do not use drugs.  Practice safe sex.  Use birth control (contraception) to prevent unwanted pregnancy. If you plan to become pregnant, see your health care provider for a pre-conception visit.  Find healthy ways to manage stress. How can I protect myself from injury? Injuries from violence or accidents are the leading cause of death among young adults and can often be prevented. Take these steps to help protect yourself:  Always wear your seat belt while driving or riding in a vehicle.  Do not drive if you have been drinking alcohol. Do not ride with someone who has been drinking.  Do not drive when you are tired or distracted. Do not text while driving.  Wear a helmet and other protective equipment during sports activities.  If you have firearms in your house, make sure you follow all gun safety procedures.  Seek help if you have been bullied, physically abused, or sexually abused.  Use the Internet responsibly to avoid dangers such as online bullying and online sexual predators.  What can I do to cope with stress? Young adults may face many new challenges that can be stressful, such as finding a job, going to college, moving away from home, managing money, being in a relationship, getting married, and having children. To manage stress:  Avoid known stressful situations when you can.  Exercise regularly.  Find a stress-reducing activity that works best for you. Examples include meditation, yoga, listening to music, or  reading.  Spend time in nature.  Keep a journal to write about your stress and how you respond.  Talk to your health care provider about stress. He or she may suggest counseling.  Spend time with supportive friends or family.  Do not cope with stress by: ? Drinking alcohol or using drugs. ? Smoking cigarettes. ? Eating.  Where can I get more information? Learn more about preventive care and healthy habits from:  [place] and Gynecologists: KaraokeExchange.nl  U.S. Probation officer Task Force: StageSync.si  National Adolescent and [place]: StrategicRoad.nl  American Academy of Pediatrics Bright Futures: https://brightfutures.MemberVerification.co.za  Society for Adolescent Health and Medicine: MoralBlog.co.za.aspx  PodExchange.nl: ToyLending.fr  This information is not intended to replace advice given to you by your health care provider. Make sure you discuss any questions you have with your health care provider. Document Released: 06/07/2015 Document Revised: 06/28/2015 Document Reviewed: 06/07/2015 Elsevier Interactive Patient Education  2018 [place]. Jasiya Markie M.D.

## 2017-12-15 ENCOUNTER — Encounter: Payer: BC Managed Care – PPO | Admitting: Internal Medicine

## 2018-02-03 DIAGNOSIS — D649 Anemia, unspecified: Secondary | ICD-10-CM

## 2018-02-03 HISTORY — DX: Anemia, unspecified: D64.9

## 2018-02-09 NOTE — Progress Notes (Signed)
Chief Complaint  Patient presents with  . Annual Exam    Pt states that she is having digestive issues and that it hurts and is hard this has been going on for a about a year. Pt states that her circulation is also bad that her feet and legs go to "sleep" when sitting for to long     HPI: Patient  Brittany Simon  23 y.o. comes in today for [place] visit   Has had for a year  Painful BM   Even if stools nl  Every other day about 80 % of time   Trying toeat healthy and have nl stookls  ocass blood . No  Constipation at this time and no abd pain   Asthma stable no current  issues Health Maintenance  Topic Date Due  . PAP SMEAR-Modifier  02/10/2018 (Originally 08/28/2016)  . PAP-Cervical Cytology Screening  02/11/2018 (Originally 08/28/2016)  . INFLUENZA VACCINE  05/04/2018 (Originally 09/03/2017)  . TETANUS/TDAP  01/09/2027  . HIV Screening  Completed   Health Maintenance Review LIFESTYLE:  Exercise:  active   Cause  Tobacco/ETS: no Alcohol:  0-3 etoh  Sugar beverages: not a lot  Sleep:   6-8  Drug use: no HH of  4  1 dog  Work: [employer] 4 grade  40 +  Serve on weekends   50 hours per week .   Periods   4-5 days .  Declined pap today   .  No current sa   ROS:  GEN/ HEENT: No fever, significant weight changes sweats headaches vision problems hearing changes, CV/ PULM; No chest pain shortness of breath cough, syncope,edema  change in exercise tolerance. GI /GU: No adominal pain, vomiting, change in bowel habits. No blood in the stool. No significant GU symptoms. SKIN/HEME: ,no acute skin rashes suspicious lesions or bleeding. No lymphadenopathy, nodules, masses.  NEURO/ PSYCH:  No neurologic signs such as weakness numbness. No depression anxiety. IMM/ Allergy: No unusual infections.  Allergy .   REST of 12 system review negative except as per HPI   Past Medical History:  Diagnosis Date  . Allergy   . Asthma   . Prematurity, fetus 35-36 completed weeks of  gestation     4 40z 36 weeks    No past surgical history on file.  Family History  Problem Relation Age of Onset  . Asthma Father     Social History   Socioeconomic History  . Marital status: Single    Spouse name: Not on file  . Number of children: Not on file  . Years of education: Not on file  . Highest education level: Not on file  Occupational History  . Not on file  Social Needs  . Financial resource strain: Not on file  . Food insecurity:    Worry: Not on file    Inability: Not on file  . Transportation needs:    Medical: Not on file    Non-medical: Not on file  Tobacco Use  . Smoking status: Never Smoker  . Smokeless tobacco: Never Used  Substance and Sexual Activity  . Alcohol use: Yes    Comment: occassionally   . Drug use: No  . Sexual activity: Not on file  Lifestyle  . Physical activity:    Days per week: Not on file    Minutes per session: Not on file  . Stress: Not on file  Relationships  . Social connections:    Talks on phone:  Not on file    Gets together: Not on file    Attends religious service: Not on file    Active member of club or organization: Not on file    Attends meetings of clubs or organizations: Not on file    Relationship status: Not on file  Other Topics Concern  . Not on file  Social History Narrative   Single   Orig from [facility] of 4  One dog      IB program.    [name].   [employer] fall 36   Girls sc[employer]    Education major, spanish minor    [employer]  4th grade     Outpatient Medications Prior to Visit  Medication Sig Dispense Refill  . albuterol (PROAIR HFA) 108 (90 Base) MCG/ACT inhaler Inhale 2 puffs into the lungs every 6 (six) hours as needed for wheezing. 1 Inhaler 0  . fluticasone (FLONASE) 50 MCG/ACT nasal spray 2 spray each nostril qd 16 g 3  . montelukast (SINGULAIR) 10 MG tablet Take 1 tablet (10 mg total) by mouth at bedtime. 30 tablet 3   No facility-administered medications prior to visit.        EXAM:  BP 112/68 (BP Location: Right Arm, Patient Position: Sitting, Cuff Size: Normal)   Pulse 90   Temp 98.6 F (37 C) (Oral)   Wt 164 lb 11.2 oz (74.7 kg)   LMP 01/18/2018 (Approximate)   BMI 29.41 kg/m   Body mass index is 29.41 kg/m. Wt Readings from Last 3 Encounters:  02/10/18 164 lb 11.2 oz (74.7 kg)  01/08/17 146 lb 4.8 oz (66.4 kg)  05/21/16 149 lb 6.4 oz (67.8 kg)    Physical Exam: Vital signs reviewed FFM:BWGY is a well-developed well-nourished alert cooperative    who appearsr stated age in no acute distress.  HEENT: normocephalic atraumatic , Eyes: PERRL EOM's full, conjunctiva clear, Nares: paten,t no deformity discharge or tenderness., Ears: no deformity EAC's clear TMs with normal landmarks. Mouth: clear OP, no lesions, edema.  Moist mucous membranes. Dentition in adequate repair. NECK: supple without masses, thyromegaly or bruits. CHEST/PULM:  Clear to auscultation and percussion breath sounds equal no wheeze , rales or rhonchi. No chest wall deformities or tenderness. . CV: PMI is nondisplaced, S1 S2 no gallops, murmurs, rubs. Peripheral pulses are full without delay.No JVD .  ABDOMEN: Bowel sounds normal nontender  No guard or rebound, no hepato splenomegal no CVA tenderness.  No hernia. Extremtities:  No clubbing cyanosis or edema, no acute joint swelling or redness no focal atrophy NEURO:  Oriented x3, cranial nerves 3-12 appear to be intact, no obvious focal weakness,gait within normal limits no abnormal reflexes or asymmetrical SKIN: No acute rashes normal turgor, color, no bruising or petechiae. PSYCH: Oriented, good eye contact, no obvious depression anxiety, cognition and judgment appear normal. LN: no cervical axillary inguinal adenopathy  Lab Results  Component Value Date   WBC 8.7 01/08/2017   HGB 11.8 (L) 01/08/2017   HCT 36.8 01/08/2017   PLT 321.0 01/08/2017   GLUCOSE 74 01/08/2017   CHOL 155 07/08/2013   TRIG 61 07/08/2013   HDL 57  07/08/2013   LDLCALC 86 07/08/2013   ALT 9 07/08/2013   AST 13 07/08/2013   NA 137 01/08/2017   K 3.7 01/08/2017   CL 101 01/08/2017   CREATININE 0.54 01/08/2017   BUN 10 01/08/2017   CO2 27 01/08/2017   TSH 1.58 01/08/2017    BP Readings  from Last 3 Encounters:  02/10/18 112/68  01/08/17 120/80  05/21/16 100/80    Ate  2 45 grapes  nf for labs   ASSESSMENT AND PLAN:  Discussed the following assessment and plan:  Visit for preventive health examination - Plan: Basic metabolic panel, CBC with Differential/Platelet, Hepatic function panel, Lipid panel, TSH  Lipid screening - Plan: Basic metabolic panel, CBC with Differential/Platelet, Hepatic function panel, Lipid panel, TSH  Anemia, unspecified type - Plan: Basic metabolic panel, CBC with Differential/Platelet, Hepatic function panel, Lipid panel, TSH  Painful defecation - for a year   not responding to diet change  consdier chonric fissure - Plan: Basic metabolic panel, CBC with Differential/Platelet, Hepatic function panel, Lipid panel, TSH, Ambulatory referral to Gastroenterology  Influenza vaccination declined by patient Declined flu vaccine  Declined rectal to day since referral anyway  Patient Care Team: Burnis Medin, MD as PCP - General Patient Instructions  It is possible that you could have a non healing fissure    Will do gi referral  Since going on for a year  . Will be contacted  Due for pap and pelvic here or at gyne.   Will notify you  of labs when available.  Fu with [place] depending on lab results and how you are doing .    Preventive Care for [place], Female The transition to life after high school as a young adult can be a stressful time with many changes. You may start seeing a primary care physician instead of a pediatrician. This is the time when your health care becomes your responsibility. Preventive care refers to lifestyle choices and visits with your health care provider that can promote  health and wellness. What does preventive care include?  A yearly physical exam. This is also called an annual wellness visit.  Dental exams once or twice a year.  Routine eye exams. Ask your health care provider how often you should have your eyes checked.  Personal lifestyle choices, including: ? Daily care of your teeth and gums. ? Regular physical activity. ? Eating a healthy diet. ? Avoiding tobacco and drug use. ? Avoiding or limiting alcohol use. ? Practicing safe sex. ? Taking vitamin and mineral supplements as recommended by your health care provider. What happens during an annual wellness visit? Preventive care starts with a yearly visit to your primary care physician. The services and screenings done by your health care provider during your annual wellness visit will depend on your overall health, lifestyle risk factors, and family history of disease. Counseling Your health care provider may ask you questions about:  Past medical problems and your family's medical history.  Medicines or supplements you take.  Health insurance and access to health care.  Alcohol, tobacco, and drug use.  Your safety at home, work, or school.  Access to firearms.  Emotional well-being and how you cope with stress.  Relationship well-being.  Diet, exercise, and sleep habits.  Your sexual health and activity.  Your methods of birth control.  Your menstrual cycle.  Your pregnancy history. Screening You may have the following tests or measurements:  Height, weight, and BMI.  Blood pressure.  Lipid and cholesterol levels.  Tuberculosis skin test.  Skin exam.  Vision and hearing tests.  Screening test for hepatitis.  Screening tests for sexually transmitted diseases (STDs), if you are at risk.  BRCA-related cancer screening. This may be done if you have a family history of breast, ovarian, tubal, or peritoneal cancers.  Pelvic exam and Pap test. This may be done  every 3 years starting at age 72. Vaccines Your health care provider may recommend certain vaccines, such as:  Influenza vaccine. This is recommended every year.  Tetanus, diphtheria, and acellular pertussis (Tdap, Td) vaccine. You may need a Td booster every 10 years.  Varicella vaccine. You may need this if you have not been vaccinated.  HPV vaccine. If you are 52 or younger, you may need three doses over 6 months.  Measles, mumps, and rubella (MMR) vaccine. You may need at least one dose of MMR. You may also need a second dose.  Pneumococcal 13-valent conjugate (PCV13) vaccine. You may need this if you have certain conditions and were not previously vaccinated.  Pneumococcal polysaccharide (PPSV23) vaccine. You may need one or two doses if you smoke cigarettes or if you have certain conditions.  Meningococcal vaccine. One dose is recommended if you are age 47-21 years and a first-year college student living in a residence hall, or if you have one of several medical conditions. You may also need additional booster doses.  Hepatitis A vaccine. You may need this if you have certain conditions or if you travel or work in places where you may be exposed to hepatitis A.  Hepatitis B vaccine. You may need this if you have certain conditions or if you travel or work in places where you may be exposed to hepatitis B.  Haemophilus influenzae type b (Hib) vaccine. You may need this if you have certain risk factors. Talk to your health care provider about which screenings and vaccines you need and how often you need them. What steps can I take to develop healthy behaviors?      Have regular preventive health care visits with your primary care physician and dentist.  Eat a healthy diet.  Drink enough fluid to keep your urine pale yellow.  Stay active. Exercise at least 30 minutes 5 or more days of the week.  Use alcohol responsibly.  Maintain a healthy weight.  Do not use any  products that contain nicotine, such as cigarettes, chewing tobacco, and e-cigarettes. If you need help quitting, ask your health care provider.  Do not use drugs.  Practice safe sex.  Use birth control (contraception) to prevent unwanted pregnancy. If you plan to become pregnant, see your health care provider for a pre-conception visit.  Find healthy ways to manage stress. How can I protect myself from injury? Injuries from violence or accidents are the leading cause of death among young adults and can often be prevented. Take these steps to help protect yourself:  Always wear your seat belt while driving or riding in a vehicle.  Do not drive if you have been drinking alcohol. Do not ride with someone who has been drinking.  Do not drive when you are tired or distracted. Do not text while driving.  Wear a helmet and other protective equipment during sports activities.  If you have firearms in your house, make sure you follow all gun safety procedures.  Seek help if you have been bullied, physically abused, or sexually abused.  Use the Internet responsibly to avoid dangers such as online bullying and online sexual predators. What can I do to cope with stress? Young adults may face many new challenges that can be stressful, such as finding a job, going to college, moving away from home, managing money, being in a relationship, getting married, and having children. To manage stress:  Avoid known stressful  situations when you can.  Exercise regularly.  Find a stress-reducing activity that works best for you. Examples include meditation, yoga, listening to music, or reading.  Spend time in nature.  Keep a journal to write about your stress and how you respond.  Talk to your health care provider about stress. He or she may suggest counseling.  Spend time with supportive friends or family.  Do not cope with stress by: ? Drinking alcohol or using drugs. ? Smoking  cigarettes. ? Eating. Where can I get more information? Learn more about preventive care and healthy habits from:  [place] and Gynecologists: KaraokeExchange.nl  U.S. Probation officer Task Force: StageSync.si  National Adolescent and [place]: StrategicRoad.nl  American Academy of Pediatrics Bright Futures: https://brightfutures.MemberVerification.co.za  Society for Adolescent Health and Medicine: MoralBlog.co.za.aspx  PodExchange.nl: ToyLending.fr This information is not intended to replace advice given to you by your health care provider. Make sure you discuss any questions you have with your health care provider. Document Released: 06/07/2015 Document Revised: 09/02/2016 Document Reviewed: 06/07/2015 Elsevier Interactive Patient Education  2019 [place] K. Malaiyah Achorn M.D.

## 2018-02-10 ENCOUNTER — Ambulatory Visit (INDEPENDENT_AMBULATORY_CARE_PROVIDER_SITE_OTHER): Payer: BC Managed Care – PPO | Admitting: Internal Medicine

## 2018-02-10 ENCOUNTER — Encounter: Payer: Self-pay | Admitting: Internal Medicine

## 2018-02-10 VITALS — BP 112/68 | HR 90 | Temp 98.6°F | Wt 164.7 lb

## 2018-02-10 DIAGNOSIS — Z2821 Immunization not carried out because of patient refusal: Secondary | ICD-10-CM | POA: Diagnosis not present

## 2018-02-10 DIAGNOSIS — Z13220 Encounter for screening for lipoid disorders: Secondary | ICD-10-CM | POA: Diagnosis not present

## 2018-02-10 DIAGNOSIS — R198 Other specified symptoms and signs involving the digestive system and abdomen: Secondary | ICD-10-CM

## 2018-02-10 DIAGNOSIS — Z0000 Encounter for general adult medical examination without abnormal findings: Secondary | ICD-10-CM | POA: Diagnosis not present

## 2018-02-10 DIAGNOSIS — D649 Anemia, unspecified: Secondary | ICD-10-CM | POA: Diagnosis not present

## 2018-02-10 NOTE — Patient Instructions (Addendum)
It is possible that you could have a non healing fissure    Will do gi referral  Since going on for a year  . Will be contacted  Due for pap and pelvic here or at gyne.   Will notify you  of labs when available.  Fu with [place] depending on lab results and how you are doing .    Preventive Care for [place], Female The transition to life after high school as a young adult can be a stressful time with many changes. You may start seeing a primary care physician instead of a pediatrician. This is the time when your health care becomes your responsibility. Preventive care refers to lifestyle choices and visits with your health care provider that can promote health and wellness. What does preventive care include?  A yearly physical exam. This is also called an annual wellness visit.  Dental exams once or twice a year.  Routine eye exams. Ask your health care provider how often you should have your eyes checked.  Personal lifestyle choices, including: ? Daily care of your teeth and gums. ? Regular physical activity. ? Eating a healthy diet. ? Avoiding tobacco and drug use. ? Avoiding or limiting alcohol use. ? Practicing safe sex. ? Taking vitamin and mineral supplements as recommended by your health care provider. What happens during an annual wellness visit? Preventive care starts with a yearly visit to your primary care physician. The services and screenings done by your health care provider during your annual wellness visit will depend on your overall health, lifestyle risk factors, and family history of disease. Counseling Your health care provider may ask you questions about:  Past medical problems and your family's medical history.  Medicines or supplements you take.  Health insurance and access to health care.  Alcohol, tobacco, and drug use.  Your safety at home, work, or school.  Access to firearms.  Emotional well-being and how you cope with stress.  Relationship  well-being.  Diet, exercise, and sleep habits.  Your sexual health and activity.  Your methods of birth control.  Your menstrual cycle.  Your pregnancy history. Screening You may have the following tests or measurements:  Height, weight, and BMI.  Blood pressure.  Lipid and cholesterol levels.  Tuberculosis skin test.  Skin exam.  Vision and hearing tests.  Screening test for hepatitis.  Screening tests for sexually transmitted diseases (STDs), if you are at risk.  BRCA-related cancer screening. This may be done if you have a family history of breast, ovarian, tubal, or peritoneal cancers.  Pelvic exam and Pap test. This may be done every 3 years starting at age 58. Vaccines Your health care provider may recommend certain vaccines, such as:  Influenza vaccine. This is recommended every year.  Tetanus, diphtheria, and acellular pertussis (Tdap, Td) vaccine. You may need a Td booster every 10 years.  Varicella vaccine. You may need this if you have not been vaccinated.  HPV vaccine. If you are 60 or younger, you may need three doses over 6 months.  Measles, mumps, and rubella (MMR) vaccine. You may need at least one dose of MMR. You may also need a second dose.  Pneumococcal 13-valent conjugate (PCV13) vaccine. You may need this if you have certain conditions and were not previously vaccinated.  Pneumococcal polysaccharide (PPSV23) vaccine. You may need one or two doses if you smoke cigarettes or if you have certain conditions.  Meningococcal vaccine. One dose is recommended if you are age 83-21  years and a Market researcher living in a residence hall, or if you have one of several medical conditions. You may also need additional booster doses.  Hepatitis A vaccine. You may need this if you have certain conditions or if you travel or work in places where you may be exposed to hepatitis A.  Hepatitis B vaccine. You may need this if you have certain  conditions or if you travel or work in places where you may be exposed to hepatitis B.  Haemophilus influenzae type b (Hib) vaccine. You may need this if you have certain risk factors. Talk to your health care provider about which screenings and vaccines you need and how often you need them. What steps can I take to develop healthy behaviors?      Have regular preventive health care visits with your primary care physician and dentist.  Eat a healthy diet.  Drink enough fluid to keep your urine pale yellow.  Stay active. Exercise at least 30 minutes 5 or more days of the week.  Use alcohol responsibly.  Maintain a healthy weight.  Do not use any products that contain nicotine, such as cigarettes, chewing tobacco, and e-cigarettes. If you need help quitting, ask your health care provider.  Do not use drugs.  Practice safe sex.  Use birth control (contraception) to prevent unwanted pregnancy. If you plan to become pregnant, see your health care provider for a pre-conception visit.  Find healthy ways to manage stress. How can I protect myself from injury? Injuries from violence or accidents are the leading cause of death among young adults and can often be prevented. Take these steps to help protect yourself:  Always wear your seat belt while driving or riding in a vehicle.  Do not drive if you have been drinking alcohol. Do not ride with someone who has been drinking.  Do not drive when you are tired or distracted. Do not text while driving.  Wear a helmet and other protective equipment during sports activities.  If you have firearms in your house, make sure you follow all gun safety procedures.  Seek help if you have been bullied, physically abused, or sexually abused.  Use the Internet responsibly to avoid dangers such as online bullying and online sexual predators. What can I do to cope with stress? Young adults may face many new challenges that can be stressful, such  as finding a job, going to college, moving away from home, managing money, being in a relationship, getting married, and having children. To manage stress:  Avoid known stressful situations when you can.  Exercise regularly.  Find a stress-reducing activity that works best for you. Examples include meditation, yoga, listening to music, or reading.  Spend time in nature.  Keep a journal to write about your stress and how you respond.  Talk to your health care provider about stress. He or she may suggest counseling.  Spend time with supportive friends or family.  Do not cope with stress by: ? Drinking alcohol or using drugs. ? Smoking cigarettes. ? Eating. Where can I get more information? Learn more about preventive care and healthy habits from:  [place] and Gynecologists: KaraokeExchange.nl  U.S. Probation officer Task Force: StageSync.si  National Adolescent and [place]: StrategicRoad.nl  American Academy of Pediatrics Bright Futures: https://brightfutures.MemberVerification.co.za  Society for Adolescent Health and Medicine: MoralBlog.co.za.aspx  PodExchange.nl: ToyLending.fr This information is not intended to replace advice given to you by your health care provider.  Make sure you discuss any questions you have with your health care provider. Document Released: 06/07/2015 Document Revised: 09/02/2016 Document Reviewed: 06/07/2015 Elsevier Interactive Patient Education  2019 [employer].

## 2018-02-11 LAB — LIPID PANEL
CHOLESTEROL: 180 mg/dL (ref 0–200)
HDL: 57.2 mg/dL (ref >39.00)
LDL Cholesterol: 114 mg/dL — ABNORMAL HIGH (ref 0–99)
NONHDL: 123.28
TRIGLYCERIDES: 46 mg/dL (ref 0.0–149.0)
Total CHOL/HDL Ratio: 3
VLDL: 9.2 mg/dL (ref 0.0–40.0)

## 2018-02-11 LAB — HEPATIC FUNCTION PANEL
ALBUMIN: 4.6 g/dL (ref 3.5–5.2)
ALK PHOS: 46 U/L (ref 39–117)
ALT: 10 U/L (ref 0–35)
AST: 14 U/L (ref 0–37)
Bilirubin, Direct: 0.1 mg/dL (ref 0.0–0.3)
TOTAL PROTEIN: 7.7 g/dL (ref 6.0–8.3)
Total Bilirubin: 0.3 mg/dL (ref 0.2–1.2)

## 2018-02-11 LAB — CBC WITH DIFFERENTIAL/PLATELET
BASOS ABS: 0.1 10*3/uL (ref 0.0–0.1)
BASOS PCT: 0.9 % (ref 0.0–3.0)
Eosinophils Absolute: 0.3 10*3/uL (ref 0.0–0.7)
Eosinophils Relative: 2.3 % (ref 0.0–5.0)
HCT: 35.9 % — ABNORMAL LOW (ref 36.0–46.0)
Hemoglobin: 11.7 g/dL — ABNORMAL LOW (ref 12.0–15.0)
LYMPHS ABS: 2.8 10*3/uL (ref 0.7–4.0)
Lymphocytes Relative: 23.6 % (ref 12.0–46.0)
MCHC: 32.6 g/dL (ref 30.0–36.0)
MCV: 76.6 fl — AB (ref 78.0–100.0)
MONOS PCT: 8.6 % (ref 3.0–12.0)
Monocytes Absolute: 1 10*3/uL (ref 0.1–1.0)
NEUTROS ABS: 7.6 10*3/uL (ref 1.4–7.7)
NEUTROS PCT: 64.6 % (ref 43.0–77.0)
PLATELETS: 302 10*3/uL (ref 150.0–400.0)
RBC: 4.69 Mil/uL (ref 3.87–5.11)
RDW: 15.7 % — AB (ref 11.5–15.5)
WBC: 11.7 10*3/uL — ABNORMAL HIGH (ref 4.0–10.5)

## 2018-02-11 LAB — BASIC METABOLIC PANEL
BUN: 9 mg/dL (ref 6–23)
CALCIUM: 9.9 mg/dL (ref 8.4–10.5)
CHLORIDE: 102 meq/L (ref 96–112)
CO2: 26 meq/L (ref 19–32)
CREATININE: 0.51 mg/dL (ref 0.40–1.20)
GFR: 193.12 mL/min (ref >60.00)
GLUCOSE: 81 mg/dL (ref 70–99)
Potassium: 4.1 mEq/L (ref 3.5–5.1)
SODIUM: 137 meq/L (ref 135–145)

## 2018-02-11 LAB — TSH: TSH: 1.85 u[IU]/mL (ref 0.35–4.50)

## 2018-02-23 ENCOUNTER — Encounter: Payer: Self-pay | Admitting: Gastroenterology

## 2018-03-19 ENCOUNTER — Encounter: Payer: Self-pay | Admitting: Gastroenterology

## 2018-03-19 ENCOUNTER — Ambulatory Visit: Payer: BC Managed Care – PPO | Admitting: Gastroenterology

## 2018-03-19 VITALS — BP 104/68 | HR 64 | Ht 62.0 in | Wt 162.1 lb

## 2018-03-19 DIAGNOSIS — K6289 Other specified diseases of anus and rectum: Secondary | ICD-10-CM

## 2018-03-19 MED ORDER — NA SULFATE-K SULFATE-MG SULF 17.5-3.13-1.6 GM/177ML PO SOLN: 1.0000 | ORAL | 0 refills | Status: DC

## 2018-03-19 MED ORDER — NA SULFATE-K SULFATE-MG SULF 17.5-3.13-1.6 GM/177ML PO SOLN: 1.0000 | ORAL | 0 refills | Status: AC

## 2018-03-19 NOTE — Patient Instructions (Addendum)
Add Benefiber daily.  I have recommended a colonoscopy.   Tips for colonoscopy:  -STAY WELL HYDRATED FOR 3-4 DAYS PRIOR TO THE EXAM. This reduces nausea and dehydration.  -TO PREVENT SKIN/HEMORRHOID IRRITATION- prior to wiping, put A&Dointment or vaseline on the toilet paper. -Keep a towel or pad on the bed.  -DRINK 64oz of clear liquids in the morning of prep day (PRIOR TO STARTING THE PREP) to be sure that there is enough fluid to flush the colon and stay hydrated!!!! This is in addition to the fluids required for preparation.

## 2018-03-19 NOTE — Progress Notes (Signed)
Referring Provider: Burnis Medin, MD Primary Care Physician:  Burnis Medin, MD   Reason for Consultation: Painful bowel movements   IMPRESSION:  Rectal pain with defecation Rare rectal bleeding  Suspected anal fissure. The patient did not tolerate any meaningful rectal exam during her visit today. Colonoscopy recommended with full rectal exam while sedated.   PLAN: Colonoscopy  I consented the patient at the bedside today discussing the risks, benefits, and alternatives to endoscopic evaluation. In particular, we discussed the risks that include, but are not limited to, reaction to medication, cardiopulmonary compromise, bleeding requiring blood transfusion, aspiration resulting in pneumonia, perforation requiring surgery, lack of diagnosis, severe illness requiring hospitalization, and even death. We reviewed the risk of missed lesion including polyps or even cancer. The patient acknowledges these risks and asks that we proceed.   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 Regis Bill for further evaluation of constipation and painful bowel movements with concerns for possible fissure.  The history is obtained through the patient and review of her electronic health record.  She reports a 1 year history of painful bowel movements.  Increased fiber and water recommended when she was evaluated by PCP at that time. However, her symptoms did not change. They have persisted since then. Rectal pain relieved by defecation. No rectal pain unrelated to defecation. Baseline BM of every other day. No blood or mucous. Pain with defecation occurs approximately 80% of the time.  Occasional blood in the stool. No other associated symptoms. No identified exacerbating or relieving features.   Mother with colon polyps. Pathology is unknown.    Past Medical History:  Diagnosis Date  . Allergy   . Asthma   . Prematurity, fetus 35-36  completed weeks of gestation     4 40z 36 weeks    History reviewed. No pertinent surgical history.  Current Outpatient Medications  Medication Sig Dispense Refill  . albuterol (PROAIR HFA) 108 (90 Base) MCG/ACT inhaler Inhale 2 puffs into the lungs every 6 (six) hours as needed for wheezing. 1 Inhaler 0  . fluticasone (FLONASE) 50 MCG/ACT nasal spray 2 spray each nostril qd (Patient taking differently: as needed. 2 spray each nostril qd) 16 g 3  . montelukast (SINGULAIR) 10 MG tablet Take 1 tablet (10 mg total) by mouth at bedtime. (Patient taking differently: Take 10 mg by mouth as needed. ) 30 tablet 3  . Na Sulfate-K Sulfate-Mg Sulf 17.5-3.13-1.6 GM/177ML SOLN Take 1 kit by mouth as directed for 30 days. 354 mL 0   No current facility-administered medications for this visit.     Allergies as of 03/19/2018  . (No Known Allergies)    Family History  Problem Relation Age of Onset  . Colon polyps Mother   . Asthma Father     Social History   Socioeconomic History  . Marital status: Single    Spouse name: Not on file  . Number of children: 0  . Years of education: Not on file  . Highest education level: Not on file  Occupational History  . Occupation: Pharmacist, hospital  Social Needs  . Financial resource strain: Not on file  . Food insecurity:    Worry: Not on file    Inability: Not on file  . Transportation needs:    Medical: Not on file    Non-medical: Not on file  Tobacco Use  . Smoking status: Never Smoker  . Smokeless  tobacco: Never Used  Substance and Sexual Activity  . Alcohol use: Yes    Comment: occassionally   . Drug use: No  . Sexual activity: Not on file  Lifestyle  . Physical activity:    Days per week: Not on file    Minutes per session: Not on file  . Stress: Not on file  Relationships  . Social connections:    Talks on phone: Not on file    Gets together: Not on file    Attends religious service: Not on file    Active member of club or organization:  Not on file    Attends meetings of clubs or organizations: Not on file    Relationship status: Not on file  . Intimate partner violence:    Fear of current or ex partner: Not on file    Emotionally abused: Not on file    Physically abused: Not on file    Forced sexual activity: Not on file  Other Topics Concern  . Not on file  Social History Narrative   Single   Orig from [facility] [employer] [facility] of 4  One dog      IB program.    [name].   [employer] fall 39   Girls scouts    Education major, spanish minor    [employer]  4th grade     Review of Systems: 12 system ROS is negative except as noted above.  Filed Weights   03/19/18 1606  Weight: 162 lb 2 oz (73.5 kg)    Physical Exam: Vital signs were reviewed. General:   Alert, well-nourished, pleasant and cooperative in NAD Head:  Normocephalic and atraumatic. Eyes:  Sclera clear, no icterus.   Conjunctiva pink. Mouth:  No deformity or lesions.   Neck:  Supple; no thyromegaly. Lungs:  Clear throughout to auscultation.   No wheezes.  Heart:  Regular rate and rhythm; no murmurs Abdomen:  Soft, nontender, normal bowel sounds. No rebound or guarding. No hepatosplenomegaly Rectal:  She would not tolerate any meaningful rectal exam  Chaperone: April Msk:  Symmetrical without gross deformities. Extremities:  No gross deformities or edema. Neurologic:  Alert and  oriented x4;  grossly nonfocal Skin:  No rash or bruise. Psych:  Alert and cooperative. Normal mood and affect.   Holger Sokolowski L. Tarri Glenn, MD, MPH [facility] 03/20/2018, 7:35 AM

## 2018-03-20 ENCOUNTER — Encounter: Payer: Self-pay | Admitting: Gastroenterology

## 2018-04-30 ENCOUNTER — Encounter: Payer: BC Managed Care – PPO | Admitting: Gastroenterology

## 2018-05-03 ENCOUNTER — Encounter: Payer: BC Managed Care – PPO | Admitting: Gastroenterology

## 2018-05-26 ENCOUNTER — Encounter: Payer: BC Managed Care – PPO | Admitting: Gastroenterology

## 2018-06-08 ENCOUNTER — Telehealth: Payer: Self-pay | Admitting: *Deleted

## 2018-06-08 NOTE — Telephone Encounter (Signed)
Covid-19 travel screening questions  Have you traveled in the last 14 days?no If yes where?  Do you now or have you had a fever in the last 14 days?no  Do you have any respiratory symptoms of shortness of breath or cough now or in the last 14 days?no  Do you have a medical history of Congestive Heart Failure?  Do you have a medical history of lung disease?  Do you have any family members or close contacts with diagnosed or suspected Covid-19?no       

## 2018-06-10 ENCOUNTER — Encounter: Payer: Self-pay | Admitting: Gastroenterology

## 2018-06-10 ENCOUNTER — Ambulatory Visit (AMBULATORY_SURGERY_CENTER): Payer: BC Managed Care – PPO | Admitting: Gastroenterology

## 2018-06-10 ENCOUNTER — Other Ambulatory Visit: Payer: Self-pay

## 2018-06-10 VITALS — BP 122/74 | HR 73 | Temp 98.3°F | Resp 10 | Ht 62.0 in | Wt 162.0 lb

## 2018-06-10 DIAGNOSIS — K625 Hemorrhage of anus and rectum: Secondary | ICD-10-CM

## 2018-06-10 DIAGNOSIS — K6289 Other specified diseases of anus and rectum: Secondary | ICD-10-CM

## 2018-06-10 MED ORDER — SODIUM CHLORIDE 0.9 % IV SOLN: 500.0000 mL | Freq: Once | INTRAVENOUS | Status: DC

## 2018-06-10 MED ORDER — AMBULATORY NON FORMULARY MEDICATION: 1 refills | Status: DC

## 2018-06-10 NOTE — Progress Notes (Signed)
A/ox3, pleased with MAC, report to RN 

## 2018-06-10 NOTE — Progress Notes (Signed)
No problems noted in the recovery room.  Phoned Brittany Simon, Pt's father tp pick pt up at the front entrance.  MAW

## 2018-06-10 NOTE — Progress Notes (Signed)
History reviewed today 

## 2018-06-10 NOTE — Op Note (Signed)
[facility] Patient Name: Brittany Simon Procedure Date: 06/10/2018 9:35 AM MRN: [id number] Endoscopist: Tressia Danas MD, MD Age: 23 Referring MD:  Date of Birth: 1995-06-14 Gender: Female Account #: [id number] Procedure:                Colonoscopy Indications:              Rectal pain, rare rectal bleeding Medicines:                See the Anesthesia note for documentation of the                            administered medications Procedure:                Pre-Anesthesia Assessment:                           - Prior to the procedure, a History and Physical                            was performed, and patient medications and                            allergies were reviewed. The patient's tolerance of                            previous anesthesia was also reviewed. The risks                            and benefits of the procedure and the sedation                            options and risks were discussed with the patient.                            All questions were answered, and informed consent                            was obtained. Prior Anticoagulants: The patient has                            taken no previous anticoagulant or antiplatelet                            agents. ASA Grade Assessment: II - A patient with                            mild systemic disease. After reviewing the risks                            and benefits, the patient was deemed in                            satisfactory condition to undergo the procedure.  After obtaining informed consent, the colonoscope                            was passed under direct vision. Throughout the                            procedure, the patient's blood pressure, pulse, and                            oxygen saturations were monitored continuously. The                            Colonoscope was introduced through the anus and                            advanced to the the terminal  ileum, with                            identification of the appendiceal orifice and IC                            valve. The colonoscopy was performed without                            difficulty. The patient tolerated the procedure                            well. The quality of the bowel preparation was                            good. The terminal ileum, ileocecal valve,                            appendiceal orifice, and rectum were photographed. Scope In: 9:42:26 AM Scope Out: 9:54:14 AM Scope Withdrawal Time: 0 hours 9 minutes 18 seconds  Total Procedure Duration: 0 hours 11 minutes 48 seconds  Findings:                 A small, non-bleeding posterior anal fissure was                            found on perianal exam.                           The entire examined colon appeared normal. No                            mucosal abnormalities seen. Complications:            No immediate complications. Estimated blood loss:                            None. Estimated Blood Loss:     Estimated blood loss: none. Impression:               - Posterior anal fissure present.                           -  The entire examined colon is normal.                           - No specimens collected. Recommendation:           - Patient has a contact number available for                            emergencies. The signs and symptoms of potential                            delayed complications were discussed with the                            patient. Return to normal activities tomorrow.                            Written discharge instructions were provided to the                            patient.                           - Add a daily stool bulking agent such as psyllium                            (Metamucil) to keep your stools soft.                           - Resume regular diet today.                           - Continue present medications. I am also                            recommending  that you use Nitroglycerin 0.125%                            applied to the rectum TID x 8 weeks.                           - Colonoscopy at age 61 for screening purposes.                           - Follow-up virtual encounter in 4-8 weeks Tressia Danas MD, MD 06/10/2018 10:00:30 AM This report has been signed electronically.

## 2018-06-10 NOTE — Patient Instructions (Signed)
YOU HAD AN ENDOSCOPIC PROCEDURE TODAY AT THE [facility]:   Refer to the procedure report that was given to you for any specific questions about what was found during the examination.  If the procedure report does not answer your questions, please call your gastroenterologist to clarify.  If you requested that your care partner not be given the details of your procedure findings, then the procedure report has been included in a sealed envelope for you to review at your convenience later.  YOU SHOULD EXPECT: Some feelings of bloating in the abdomen. Passage of more gas than usual.  Walking can help get rid of the air that was put into your GI tract during the procedure and reduce the bloating. If you had a lower endoscopy (such as a colonoscopy or flexible sigmoidoscopy) you may notice spotting of blood in your stool or on the toilet paper. If you underwent a bowel prep for your procedure, you may not have a normal bowel movement for a few days.  Please Note:  You might notice some irritation and congestion in your nose or some drainage.  This is from the oxygen used during your procedure.  There is no need for concern and it should clear up in a day or so.  SYMPTOMS TO REPORT IMMEDIATELY:   Following lower endoscopy (colonoscopy or flexible sigmoidoscopy):  Excessive amounts of blood in the stool  Significant tenderness or worsening of abdominal pains  Swelling of the abdomen that is new, acute  Fever of 100F or higher   For urgent or emergent issues, a gastroenterologist can be reached at any hour by calling [phone].   DIET:  We do recommend a small meal at first, but then you may proceed to your regular diet.  Drink plenty of fluids but you should avoid alcoholic beverages for 24 hours.  ACTIVITY:  You should plan to take it easy for the rest of today and you should NOT DRIVE or use heavy machinery until tomorrow (because of the sedation medicines used during the test).     FOLLOW UP: Our staff will call the number listed on your records the next business day following your procedure to check on you and address any questions or concerns that you may have regarding the information given to you following your procedure. If we do not reach you, we will leave a message.  However, if you are feeling well and you are not experiencing any problems, there is no need to return our call.  We will assume that you have returned to your regular daily activities without incident. We will be calling you two weeks following your procedure to see if you have developed any symptoms of the COVID-19.  If you develop any symptoms before then, please let us know.  If any biopsies were taken you will be contacted by phone or by letter within the next 1-3 weeks.  Please call us at [phone] if you have not heard about the biopsies in 3 weeks.    SIGNATURES/CONFIDENTIALITY: You and/or your care partner have signed paperwork which will be entered into your electronic medical record.  These signatures attest to the fact that that the information above on your After Visit Summary has been reviewed and is understood.  Full responsibility of the confidentiality of this discharge information lies with you and/or your care-partner.    Written rx was given to you to take to [facility]. Add a daily stool bulking agent  example :  Metamucil which is over the counter. You may resume your current medications today. Follow up Virtual appointment in 4-8 weeks.  The office staff will call you to schedule an appointment. Please call if any questions or concerns.

## 2018-06-10 NOTE — Progress Notes (Signed)
Oral temp per Courtney Washington, VS per Judy Branson. 

## 2018-06-14 ENCOUNTER — Telehealth: Payer: Self-pay

## 2018-06-14 ENCOUNTER — Encounter: Payer: Self-pay | Admitting: *Deleted

## 2018-06-14 ENCOUNTER — Telehealth: Payer: Self-pay | Admitting: *Deleted

## 2018-06-14 NOTE — Telephone Encounter (Signed)
  Follow up Call-  Call back number 06/10/2018  Post procedure Call Back phone  # [phone]  Permission to leave phone message Yes  Some recent data might be hidden     Patient questions:  Do you have a fever, pain , or abdominal swelling? No. Pain Score  0 *  Have you tolerated food without any problems? Yes.    Have you been able to return to your normal activities? Yes.    Do you have any questions about your discharge instructions: Diet   No. Medications  No. Follow up visit  No.  Do you have questions or concerns about your Care? No.  Actions: * If pain score is 4 or above: No action needed, pain <4.

## 2018-06-14 NOTE — Telephone Encounter (Signed)
6 week follow up visit scheduled with Dr. Orvan Falconer on 07/27/18 at 9:00 am. Letter sent to home and via MyChart.

## 2018-06-14 NOTE — Telephone Encounter (Signed)
No voicemail and unable to leave message. 

## 2018-06-17 ENCOUNTER — Telehealth: Payer: Self-pay | Admitting: *Deleted

## 2018-06-17 NOTE — Telephone Encounter (Signed)
1. Have you developed a fever since your procedure? no  2.   Have you had an respiratory symptoms (SOB or cough) since your procedure? no  3.   Have you tested positive for COVID 19 since your procedure no  3.   Have you had any family members/close contacts diagnosed with the COVID 19 since your procedure?  no   If any of these questions are a yes, please inquire if patient has been seen by family doctor and route this note to Tracy Walton, RN.  

## 2018-07-23 ENCOUNTER — Ambulatory Visit: Payer: Self-pay | Admitting: *Deleted

## 2018-07-23 NOTE — Telephone Encounter (Signed)
Message from Beverley Fiedler sent at 07/23/2018 4:27 PM EDT  Summary: Clinical Advice   Patient stated that for the last week she has been having chest tightness, wheezing and throat tightness. Please advise         Pt calling with complaints of wheezing, chest and throat tightness that has been occurring off and on for the past week. Pt states she is having wheezing at the time of the call with walking to her car and states that her chest also feels tight. Pt rates chest pain at 4 at this time. Pt states she has a history of asthma but has not had an exacerbation in over a year. Pt states that asthma is usually induce by allergies. Pt advised to seek treatment in the ED/Urgent Care for current symptoms. Pt verbalized understanding.  Reason for Disposition . [1] MILD difficulty breathing (e.g., minimal/no SOB at rest, SOB with walking, pulse <100) AND [2] NEW-onset or WORSE than normal  Answer Assessment - Initial Assessment Questions 1. RESPIRATORY STATUS: "Describe your breathing?" (e.g., wheezing, shortness of breath, unable to speak, severe coughing)     wheezing 2. ONSET: "When did this breathing problem begin?"      Last week 3. PATTERN "Does the difficult breathing come and go, or has it been constant since it started?"      Comes and goes 4. SEVERITY: "How bad is your breathing?" (e.g., mild, moderate, severe)    - MILD: No SOB at rest, mild SOB with walking, speaks normally in sentences, can lay down, no retractions, pulse < 100.    - MODERATE: SOB at rest, SOB with minimal exertion and prefers to sit, cannot lie down flat, speaks in phrases, mild retractions, audible wheezing, pulse 100-120.    - SEVERE: Very SOB at rest, speaks in single words, struggling to breathe, sitting hunched forward, retractions, pulse > 120      mild 5. RECURRENT SYMPTOM: "Have you had difficulty breathing before?" If so, ask: "When was the last time?" and "What happened that time?"      Yes but it has  been about a year 6. CARDIAC HISTORY: "Do you have any history of heart disease?" (e.g., heart attack, angina, bypass surgery, angioplasty)      No 7. LUNG HISTORY: "Do you have any history of lung disease?"  (e.g., pulmonary embolus, asthma, emphysema)     Asthma  8. CAUSE: "What do you think is causing the breathing problem?"      unknown 9. OTHER SYMPTOMS: "Do you have any other symptoms? (e.g., dizziness, runny nose, cough, chest pain, fever)     Cough yes but may be coming from wheezing and chest tightness 10. PREGNANCY: "Is there any chance you are pregnant?" "When was your last menstrual period?"       No, LMP- week of the 18th 11. TRAVEL: "Have you traveled out of the country in the last month?" (e.g., travel history, exposures)       No  Protocols used: BREATHING DIFFICULTY-A-AH

## 2018-07-26 NOTE — Progress Notes (Signed)
TELEHEALTH VISIT  Referring Provider: Madelin HeadingsPanosh, Wanda K, MD Primary Care Physician:  Madelin HeadingsPanosh, Wanda K, MD   Tele-visit due to COVID-19 pandemic Patient requested visit virtually, consented to the virtual encounter via video enabled telemedicine application Contact made at: 07/27/18 09:30 Patient verified by name and date of birth Location of patient: Home Location provider: [place] [facility] Names of persons participating: Me, patient, Deneise LeverDesiree Brown CMA Time spent on telehealth visit: 13 minutes I discussed the limitations of evaluation and management by telemedicine. The patient expressed understanding and agreed to proceed.  Chief complaint:  Rectal pain and bleeding   IMPRESSION:  Posterior anal fissure  No known family history of colon cancer or polyps  Her anal fissure is not completely treated.  I recommended at least 6 weeks of nitroglycerin 0.125% gel applied 3 times daily in addition to keeping her stool soft and bulky.  PLAN: Sitz baths two to three times daily for pain relief for recurrent bleeding High fiber diet - increasing both dietary fiber (35 grams daily) and water intake  Citrucel or Metamucil daily, add Miralax 17 g daily if needed to keep stools soft Nitroglycerin 0.125% gel applied to the rectum TID x 6-8 weeks Follow-up in 3 months, or earlier as needed  HPI: Brittany Simon is a 23 y.o. female recently evaluated for rectal pain with defecation and rectal bleeding.  The interval history is obtained to the patient and review of her electronic health record.  Colonoscopy 06/10/2018 showed a small nonbleeding posterior anal fissure.  The colonoscopy was otherwise normal. I recommended Nitroglycerin 0.125% applied to the rectum TID x 8 weeks and colonoscopy at age 23 for screening purposes. She used the nitroglycerin for 2-3 days and thought things overall improved. She continues to have some intermittent pain and bleeding, but, she feels like its overall  better. She has noted that diet and water help control her symptoms and overall feels like she is better.  No abdominal pain, constipation, or diarrhea.  No new complaints or concerns.   Past Medical History:  Diagnosis Date   Allergy    Anemia 02/2018   Asthma    Prematurity, fetus 35-36 completed weeks of gestation     4 40z 36 weeks    No past surgical history on file.  Current Outpatient Medications  Medication Sig Dispense Refill   albuterol (PROAIR HFA) 108 (90 Base) MCG/ACT inhaler Inhale 2 puffs into the lungs every 6 (six) hours as needed for wheezing. 1 Inhaler 0   AMBULATORY NON FORMULARY MEDICATION Medication Name: Nitroglycerin 0.125% gel. Apply pea size amount to the rectum three times a day for 8 weeks 30 g 1   fluticasone (FLONASE) 50 MCG/ACT nasal spray 2 spray each nostril qd (Patient taking differently: as needed. 2 spray each nostril qd) 16 g 3   montelukast (SINGULAIR) 10 MG tablet Take 1 tablet (10 mg total) by mouth at bedtime. (Patient taking differently: Take 10 mg by mouth as needed. ) 30 tablet 3   No current facility-administered medications for this visit.     Allergies as of 07/27/2018   (No Known Allergies)    Family History  Problem Relation Age of Onset   Colon polyps Mother    Asthma Father    Colon cancer Neg Hx    Esophageal cancer Neg Hx    Rectal cancer Neg Hx    Stomach cancer Neg Hx     Social History   Socioeconomic History   Marital status:  Single    Spouse name: Not on file   Number of children: 0   Years of education: Not on file   Highest education level: Not on file  Occupational History   Occupation: Pharmacist, hospital  Social Needs   Financial resource strain: Not on file   Food insecurity    Worry: Not on file    Inability: Not on file   Transportation needs    Medical: Not on file    Non-medical: Not on file  Tobacco Use   Smoking status: Never Smoker   Smokeless tobacco: Never Used  Substance  and Sexual Activity   Alcohol use: Yes    Comment: occassionally    Drug use: No   Sexual activity: Not on file  Lifestyle   Physical activity    Days per week: Not on file    Minutes per session: Not on file   Stress: Not on file  Relationships   Social connections    Talks on phone: Not on file    Gets together: Not on file    Attends religious service: Not on file    Active member of club or organization: Not on file    Attends meetings of clubs or organizations: Not on file    Relationship status: Not on file   Intimate partner violence    Fear of current or ex partner: Not on file    Emotionally abused: Not on file    Physically abused: Not on file    Forced sexual activity: Not on file  Other Topics Concern   Not on file  Social History Narrative   Single   Orig from [facility] of 4  One dog      IB program.    [name].   [employer] fall 19   [employer]    Education major, spanish minor    [employer]  4th grade     Physical Exam: Complete physical exam not performed due to the limits inherent in a telehealth encounter.  General: Awake, alert, and oriented, and well communicative. In no acute distress.  HEENT: EOMI, non-icteric sclera, NCAT, MMM  Neck: Normal movement of head and neck  Pulm: No labored breathing, speaking in full sentences without conversational dyspnea  Derm: No apparent lesions or bruising in visible field  MS: Moves all visible extremities without noticeable abnormality  Psych: Pleasant, cooperative, normal speech, normal affect and normal insight Neuro: Alert and appropriate   Brittany Berni L. Tarri Glenn, MD, MPH [facility] 07/26/2018, 12:46 PM

## 2018-07-27 ENCOUNTER — Ambulatory Visit (INDEPENDENT_AMBULATORY_CARE_PROVIDER_SITE_OTHER): Payer: BC Managed Care – PPO | Admitting: Gastroenterology

## 2018-07-27 ENCOUNTER — Encounter: Payer: Self-pay | Admitting: Gastroenterology

## 2018-07-27 VITALS — Ht 62.0 in | Wt 162.0 lb

## 2018-07-27 DIAGNOSIS — K602 Anal fissure, unspecified: Secondary | ICD-10-CM

## 2018-07-27 NOTE — Patient Instructions (Addendum)
I want your fissure to be completely treated and your symptoms improved.   Please use the nitroglycerin 0.125% gel applied to the rectum three times a day for the next 6 weeks, even if you are feeling better. It can take a long time for a fissure to heal.   Congratulations on trying to improve your diet and drink more water! Eat at least 25-30 g of fiber in your diet through fruits, vegetables, and bran fibers.  Use Citrucel 1 tablespoon twice daily as needed to have soft, formed stools.  You can consider adding MiraLAX 1 capful daily if your stools are not soft despite the Citrucel.  Limit your time on the toilet.  Sit no longer than 5 minutes and avoid straining.  If you develop recurrent rectal pain and bleeding resume sitz bath's after bowel movements (epsome salt in a bathtub with warm water, sit in the warm water 10 minutes up to twice a day as needed) and notify me so that we you may resume nitroglycerin ointment.  Let us touch base in 3 months, earlier with any new questions or concerns.   How to Take a [employer] Industries[employer] Bath A sitz bath is a warm water bath that may be used to care for your rectum, genital area, or the area between your rectum and genitals (perineum). For a sitz bath, the water only comes up to your hips and covers your buttocks. A sitz bath may done at home in a bathtub or with a portable sitz bath that fits over the toilet. Your health care provider may recommend a sitz bath to help:  Relieve pain and discomfort after delivering a baby.  Relieve pain and itching from hemorrhoids or anal fissures.  Relieve pain after certain surgeries.  Relax muscles that are sore or tight. How to take a sitz bath Take 3-4 sitz baths a day, or as many as told by your health care provider. Bathtub sitz bath To take a sitz bath in a bathtub: 1. Partially fill a bathtub with warm water. The water should be deep enough to cover your hips and buttocks when you are sitting in the tub. 2. If  your health care provider told you to put medicine in the water, follow his or her instructions. 3. Sit in the water. 4. Open the tub drain a little, and leave it open during your bath. 5. Turn on the warm water again, enough to replace the water that is draining out. Keep the water running throughout your bath. This helps keep the water at the right level and the right temperature. 6. Soak in the water for 15-20 minutes, or as long as told by your health care provider. 7. When you are done, be careful when you stand up. You may feel dizzy. 8. After the sitz bath, pat yourself dry. Do not rub your skin to dry it.  Over-the-toilet sitz bath To take a sitz bath with an over-the-toilet basin: 1. Follow the manufacturer's instructions. 2. Fill the basin with warm water. 3. If your health care provider told you to put medicine in the water, follow his or her instructions. 4. Sit on the seat. Make sure the water covers your buttocks and perineum. 5. Soak in the water for 15-20 minutes, or as long as told by your health care provider. 6. After the sitz bath, pat yourself dry. Do not rub your skin to dry it. 7. Clean and dry the basin between uses. 8. Discard the basin if it cracks, or  according to the manufacturer's instructions. Contact a health care provider if:  Your symptoms get worse. Do not continue with sitz baths if your symptoms get worse.  You have new symptoms. If this happens, do not continue with sitz baths until you talk with your health care provider. Summary  A sitz bath is a warm water bath in which the water only comes up to your hips and covers your buttocks.  A sitz bath may help relieve itching, relieve pain, and relax muscles that are sore or tight in the lower part of your body, including your genital area.  Take 3-4 sitz baths a day, or as many as told by your health care provider. Soak in the water for 15-20 minutes.  Do not continue with sitz baths if your symptoms  get worse. This information is not intended to replace advice given to you by your health care provider. Make sure you discuss any questions you have with your health care provider. Document Released: 10/13/2003 Document Revised: 01/22/2017 Document Reviewed: 01/22/2017 Elsevier Interactive Patient Education  2019 [employer].

## 2018-07-28 ENCOUNTER — Ambulatory Visit (INDEPENDENT_AMBULATORY_CARE_PROVIDER_SITE_OTHER): Payer: BC Managed Care – PPO | Admitting: Internal Medicine

## 2018-07-28 ENCOUNTER — Other Ambulatory Visit: Payer: Self-pay

## 2018-07-28 ENCOUNTER — Encounter: Payer: Self-pay | Admitting: Internal Medicine

## 2018-07-28 DIAGNOSIS — J302 Other seasonal allergic rhinitis: Secondary | ICD-10-CM | POA: Diagnosis not present

## 2018-07-28 DIAGNOSIS — J4520 Mild intermittent asthma, uncomplicated: Secondary | ICD-10-CM

## 2018-07-28 DIAGNOSIS — R0981 Nasal congestion: Secondary | ICD-10-CM | POA: Diagnosis not present

## 2018-07-28 NOTE — Progress Notes (Signed)
Virtual Visit via Video Note  I connected with@ on 07/28/18 at  2:00 PM EDT by a video enabled telemedicine application and verified that I am speaking with the correct person using two identifiers. Location patient: home Location provider:work  office Persons participating in the virtual visit: patient, provider  [place]th national recommendations  regarding COVID 19 pandemic   video visit is advised over in office visit for this patient.  Patient aware  of the limitations of evaluation and management by telemedicine and  availability of in person appointments. and agreed to proceed.   HPI: Brittany Simon presents for video visit   See note last week  Seen at urgent care for intermittent wheezing and sob  With minimal cough . [facility] and felt [facility] might be asthmatic and fiven proair   In haler that has helped a good bit but not resolved.  No itching ocass sneezing  Today  Has nasal congestion  Feels more like allergy than a cold .     Remote Hx of asthma ? 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   No one else is sick or   covid risk exposed .    ROS: See pertinent positives and negatives per HPI.  Past Medical History:  Diagnosis Date  . Allergy   . Anemia 02/2018  . Asthma   . Prematurity, fetus 35-36 completed weeks of gestation     4 40z 36 weeks    History reviewed. No pertinent surgical history.  Family History  Problem Relation Age of Onset  . Colon polyps Mother   . Asthma Father   . Colon cancer Neg Hx   . Esophageal cancer Neg Hx   . Rectal cancer Neg Hx   . Stomach cancer Neg Hx     Social History   Tobacco Use  . Smoking status: Never Smoker  . Smokeless tobacco: Never Used  Substance Use Topics  . Alcohol use: Yes    Comment: occassionally   . Drug use: No      Current Outpatient Medications:  .  albuterol (PROAIR HFA) 108 (90 Base) MCG/ACT inhaler, Inhale 2 puffs into the lungs every 6 (six) hours as needed  for wheezing., Disp: 1 Inhaler, Rfl: 0 .  AMBULATORY NON FORMULARY MEDICATION, Medication Name: Nitroglycerin 0.125% gel. Apply pea size amount to the rectum three times a day for 8 weeks, Disp: 30 g, Rfl: 1 .  fluticasone (FLONASE) 50 MCG/ACT nasal spray, 2 spray each nostril qd (Patient taking differently: as needed. 2 spray each nostril qd), Disp: 16 g, Rfl: 3  EXAM: BP Readings from Last 3 Encounters:  06/10/18 122/74  03/19/18 104/68  02/10/18 112/68    VITALS per patient if applicable:  GENERAL: alert, oriented, appears well and in no acute distress looks congested and  Allergic facies  ( patient said congestion  just started )   HEENT: atraumatic, conjunttiva clear, no obvious abnormalities on inspection of external nose and ears NECK: normal movements of the head and neck LUNGS: on inspection no signs of respiratory distress, breathing rate appears normal, no obvious gross SOB, gasping or wheezing nl speech  CV: no obvious cyanosis MS: moves all visible extremities without noticeable abnormality PSYCH/NEURO: pleasant and cooperative, no obvious depression or anxiety, speech and thought processing grossly intact   ASSESSMENT AND PLAN:  Discussed the following assessment and plan:    ICD-10-CM   1. Mild  intermittent reactive airway disease without complication  Z12.81    suspected   2. Seasonal allergic rhinitis, unspecified trigger  J30.2   3. Nose congestion  R09.81    Suspect allergic upper sx  But no obv trigger  Has past hx of rx allergy asthma and was a premie.  Since  Albuterol helps and not needed  Today will rx the upper  resp sx with Flonase qd and antihistamine zyrtec  And send in my chart message next week  about progress  consider other   intervention  Disc singular  As she has been on this in past but will wait  At this time   Fu with alarm sx in  Interim  Counseled.   Expectant management and discussion of plan and treatment with opportunity to ask questions  and all were answered. The patient agreed with the plan and demonstrated an understanding of the instructions.   Advised to call back or seek an in-person evaluation if worsening  or having  further concerns .  Shanon Ace, MD

## 2018-07-30 NOTE — Telephone Encounter (Signed)
Pt had virtual visit.

## 2019-05-25 ENCOUNTER — Other Ambulatory Visit: Payer: Self-pay

## 2019-05-26 MED ORDER — ALBUTEROL SULFATE HFA 108 (90 BASE) MCG/ACT IN AERS: 2.0000 | INHALATION_SPRAY | Freq: Four times a day (QID) | RESPIRATORY_TRACT | 0 refills | Status: DC | PRN

## 2019-08-31 NOTE — Progress Notes (Signed)
Chief Complaint  Patient presents with  . Annual Exam    Doing okay  . Menstrual Problem    uses app to track cycles    HPI: Patient  Brittany Simon  24 y.o. comes in today for Preventive Health Care visit  But also concern about   Periods uses otc app.    June nl  Periods   And now late almost 40 days and never had lat  Period  10 days late   Menarche age 15  Usually once a month . Usually 5-6 days ave  Average cramps.   6-7 pounds.  Feels pre menstrual but no menses.  Never had pap  Had vaginal exam uncomfortable  When had yeast and had avoided  Exam  Had covid vaccine pfizer   End march .  Last SA January   Health Maintenance  Topic Date Due  . Hepatitis C Screening  Never done  . PAP-Cervical Cytology Screening  Never done  . PAP SMEAR-Modifier  Never done  . INFLUENZA VACCINE  09/04/2019  . TETANUS/TDAP  01/09/2027  . HIV Screening  Completed   Health Maintenance Review LIFESTYLE:  Exercise:   Work job on [employer] and   Tobacco/ETS: Alcohol: ocass weekl  Sugar beverages: ocass. Sleep: 7-8  Drug use: no HH of  4 with parents.    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 GI /GU: No adominal pain, vomiting, change in bowel habits. No blood in the stool. No significant GU symptoms. SKIN/HEME: ,no acute skin rashes suspicious lesions or bleeding. No lymphadenopathy, nodules, masses.  NEURO/ PSYCH:  No neurologic signs such as weakness numbness. No depression anxiety. IMM/ Allergy: No unusual infections.  Allergy .   REST of 12 system review negative except as per HPI   Past Medical History:  Diagnosis Date  . Allergy   . Anemia 02/2018  . Asthma   . Prematurity, fetus 35-36 completed weeks of gestation     4 40z 36 weeks     History reviewed. No pertinent surgical history.  Family History  Problem Relation Age of Onset  . Colon polyps Mother   . Asthma Father   . Colon cancer Neg Hx   . Esophageal cancer Neg Hx   . Rectal cancer Neg Hx   . Stomach cancer Neg Hx     Social History   Socioeconomic History  . Marital status: Single    Spouse name: Not on file  . Number of children: 0  . Years of education: Not on file  . Highest education level: Not on file  Occupational History  . Occupation: Pharmacist, hospital  Tobacco Use  . Smoking status: Never Smoker  . Smokeless tobacco: Never Used  Vaping Use  . Vaping Use: Never used  Substance and Sexual Activity  . Alcohol use: Yes    Comment: occassionally   . Drug use: No  . Sexual activity: Not on file  Other Topics Concern  . Not on file  Social History Narrative   Single   Orig from [facility] of 4  One dog      IB program.    Brittany Simon.   [employer] fall 80   [employer]s [employer]    Education  major, spanish minor    [employer]  4th grade    Social Determinants of Health   Financial Resource Strain:   . Difficulty of Paying Living Expenses:   Food Insecurity:   . Worried About Charity fundraiser in the Last Year:   . Arboriculturist in the Last Year:   Transportation Needs:   . Film/video editor (Medical):   Marland Kitchen Lack of Transportation (Non-Medical):   Physical Activity:   . Days of Exercise per Week:   . Minutes of Exercise per Session:   Stress:   . Feeling of Stress :   Social Connections:   . Frequency of Communication with Friends and Family:   . Frequency of Social Gatherings with Friends and Family:   . Attends Religious Services:   . Active Member of Clubs or Organizations:   . Attends Archivist Meetings:   Marland Kitchen Marital Status:     Outpatient Medications Prior to Visit  Medication Sig Dispense Refill  . albuterol (PROAIR HFA) 108 (90 Base) MCG/ACT inhaler Inhale 2 puffs into the lungs every 6 (six) hours as needed  for wheezing. Needs follow up for refills. [phone] 6.7 g 0  . AMBULATORY NON FORMULARY MEDICATION Medication Name: Nitroglycerin 0.125% gel. Apply pea size amount to the rectum three times a day for 8 weeks 30 g 1  . betamethasone dipropionate 0.05 % lotion SMARTSIG:Sparingly Topical    . fluticasone (FLONASE) 50 MCG/ACT nasal spray 2 spray each nostril qd (Patient taking differently: as needed. 2 spray each nostril qd) 16 g 3   No facility-administered medications prior to visit.     EXAM:  BP 112/76   Temp 99.4 F (37.4 C) (Oral)   Ht 5' 3.25" (1.607 m)   Wt 168 lb 3.2 oz (76.3 kg)   BMI 29.56 kg/m   Body mass index is 29.56 kg/m. Wt Readings from Last 3 Encounters:  09/02/19 168 lb 3.2 oz (76.3 kg)  07/27/18 162 lb (73.5 kg)  06/10/18 162 lb (73.5 kg)    Physical Exam: Vital signs reviewed CBS:WHQP is a well-developed well-nourished alert cooperative    who appearsr stated age in no acute distress.  HEENT: normocephalic atraumatic , Eyes: PERRL EOM's full, conjunctiva clear, Nares: paten,t no deformity discharge or tenderness., Ears: no deformity EAC's clear TMs with normal landmarks. Mouth:masked NECK: supple without masses, r bruits. Thyroid palpable poss  Enlarged no nodules  CHEST/PULM:  Clear to auscultation and percussion breath sounds equal no wheeze , rales or rhonchi. No chest wall deformities or tenderness. Breast: normal by inspection . No dimpling, discharge, masses, tenderness or discharge . CV: PMI is nondisplaced, S1 S2 no gallops, murmurs, rubs. Peripheral pulses are full without delay.No JVD .  ABDOMEN: Bowel sounds normal nontender  No guard or rebound, no hepato splenomegal no CVA tenderness.  No hernia. Extremtities:  No clubbing cyanosis or edema, no acute joint swelling or redness no focal atrophy NEURO:  Oriented x3, cranial nerves 3-12 appear to be intact, no obvious focal weakness,gait within normal limits no abnormal reflexes or  asymmetrical SKIN: No acute rashes normal turgor, color, no bruising or petechiae. PSYCH: Oriented, good eye contact, no obvious depression anxiety, cognition and judgment appear normal. LN: no cervical axillary inguinal adenopathy Ext gu nl no obstruction attempted   To do pap  Pelvic and  Stopped cause of  discomfort   So stopped  But vaginal opening seems nl   Lab Results  Component  Value Date   WBC 9.7 09/02/2019   HGB 10.6 (L) 09/02/2019   HCT 33.9 (L) 09/02/2019   PLT 347 09/02/2019   GLUCOSE 81 02/10/2018   CHOL 180 02/10/2018   TRIG 46.0 02/10/2018   HDL 57.20 02/10/2018   LDLCALC 114 (H) 02/10/2018   ALT 10 02/10/2018   AST 14 02/10/2018   NA 137 02/10/2018   K 4.1 02/10/2018   CL 102 02/10/2018   CREATININE 0.51 02/10/2018   BUN 9 02/10/2018   CO2 26 02/10/2018   TSH 1.85 02/10/2018    BP Readings from Last 3 Encounters:  09/02/19 112/76  06/10/18 122/74  03/19/18 104/68   Lab plan reviewed with patient   ASSESSMENT AND PLAN:  Discussed the following assessment and plan:    ICD-10-CM   1. Visit for preventive health examination  K24.09 Basic metabolic panel    CBC with Differential/Platelet    Hemoglobin A1c    Hepatic function panel    Lipid panel    TSH    T4, free    T3, free    HCG, Qualitative    HCG, Qualitative    T3, free    T4, free    TSH    Lipid panel    Hepatic function panel    Hemoglobin A1c    CBC with Differential/Platelet    Basic metabolic panel  2. Goiter  B35.3 Basic metabolic panel    CBC with Differential/Platelet    Hemoglobin A1c    Hepatic function panel    Lipid panel    TSH    T4, free    T3, free    HCG, Qualitative    HCG, Qualitative    T3, free    T4, free    TSH    Lipid panel    Hepatic function panel    Hemoglobin A1c    CBC with Differential/Platelet    Basic metabolic panel  3. Irregular periods  G99.2 Basic metabolic panel    CBC with Differential/Platelet    Hemoglobin A1c    Hepatic  function panel    Lipid panel    TSH    T4, free    T3, free    HCG, Qualitative    HCG, Qualitative    T3, free    T4, free    TSH    Lipid panel    Hepatic function panel    Hemoglobin A1c    CBC with Differential/Platelet    Basic metabolic panel  plan gyne referral  Do not suspect serious disease with the late period   fam hx of  Gest dm  Thyroid seems enlarged  No nodules   Plan labs   Try using  Small tampons for comfort for vag exam a etc   No follow-ups on file.  Patient Care Team: Burnis Medin, MD as PCP - General Patient Instructions  Lab today  Checking thyroid and blood sugar  Will get  Gyne  Opinion but I done suspect any serious issue .   Continue to track  Periods   Sometimes   ocass  irreg periods happen  And then goes back to normal  Without cause .      Preventive Care 46-78 Years Old, Female Preventive care refers to lifestyle choices and visits with your health care provider that can promote health and wellness. At this stage in your life, you may start seeing a primary care physician instead of a pediatrician. Your health  care is now your responsibility. Preventive care for young adults includes:  A yearly physical exam. This is also called an annual wellness visit.  Regular dental and eye exams.  Immunizations.  Screening for certain conditions.  Healthy lifestyle choices, such as diet and exercise. What can I expect for my preventive care visit? Physical exam Your health care provider may check:  Height and weight. These may be used to calculate body mass index (BMI), which is a measurement that tells if you are at a healthy weight.  Heart rate and blood pressure.  Body temperature. Counseling Your health care provider may ask you questions about:  Past medical problems and family medical history.  Alcohol, tobacco, and drug use.  Home and relationship well-being.  Access to firearms.  Emotional well-being.  Diet,  exercise, and sleep habits.  Sexual activity and sexual health.  Method of birth control.  Menstrual cycle.  Pregnancy history. What immunizations do I need?  Influenza (flu) vaccine  This is recommended every year. Tetanus, diphtheria, and pertussis (Tdap) vaccine  You may need a Td booster every 10 years. Varicella (chickenpox) vaccine  You may need this vaccine if you have not already been vaccinated. Human papillomavirus (HPV) vaccine  If recommended by your health care provider, you may need three doses over 6 months. Measles, mumps, and rubella (MMR) vaccine  You may need at least one dose of MMR. You may also need a second dose. Meningococcal conjugate (MenACWY) vaccine  One dose is recommended if you are 51-46 years old and a Market researcher living in a residence hall, or if you have one of several medical conditions. You may also need additional booster doses. Pneumococcal conjugate (PCV13) vaccine  You may need this if you have certain conditions and were not previously vaccinated. Pneumococcal polysaccharide (PPSV23) vaccine  You may need one or two doses if you smoke cigarettes or if you have certain conditions. Hepatitis A vaccine  You may need this if you have certain conditions or if you travel or work in places where you may be exposed to hepatitis A. Hepatitis B vaccine  You may need this if you have certain conditions or if you travel or work in places where you may be exposed to hepatitis B. Haemophilus influenzae type b (Hib) vaccine  You may need this if you have certain risk factors. You may receive vaccines as individual doses or as more than one vaccine together in one shot (combination vaccines). Talk with your health care provider about the risks and benefits of combination vaccines. What tests do I need? Blood tests  Lipid and cholesterol levels. These may be checked every 5 years starting at age 68.  Hepatitis C  test.  Hepatitis B test. Screening  Pelvic exam and Pap test. This may be done every 3 years starting at age 54.  Sexually transmitted disease (STD) testing, if you are at risk.  BRCA-related cancer screening. This may be done if you have a family history of breast, ovarian, tubal, or peritoneal cancers. Other tests  Tuberculosis skin test.  Vision and hearing tests.  Skin exam.  Breast exam. Follow these instructions at home: Eating and drinking   Eat a diet that includes fresh fruits and vegetables, whole grains, lean protein, and low-fat dairy products.  Drink enough fluid to keep your urine pale yellow.  Do not drink alcohol if: ? Your health care provider tells you not to drink. ? You are pregnant, may be pregnant, or are  planning to become pregnant. ? You are under the legal drinking age. In the [place] the legal drinking age is 38.  If you drink alcohol: ? Limit how much you have to 0-1 drink a day. ? Be aware of how much alcohol is in your drink. In the [place] one drink equals one 12 oz bottle of beer (355 mL), one 5 oz glass of wine (148 mL), or one 1 oz glass of hard liquor (44 mL). Lifestyle  Take daily care of your teeth and gums.  Stay active. Exercise at least 30 minutes 5 or more days of the week.  Do not use any products that contain nicotine or tobacco, such as cigarettes, e-cigarettes, and chewing tobacco. If you need help quitting, ask your health care provider.  Do not use drugs.  If you are sexually active, practice safe sex. Use a condom or other form of birth control (contraception) in order to prevent pregnancy and STIs (sexually transmitted infections). If you plan to become pregnant, see your health care provider for a pre-conception visit.  Find healthy ways to cope with stress, such as: ? Meditation, yoga, or listening to music. ? Journaling. ? Talking to a trusted person. ? Spending time with friends and family. Safety  Always wear your  seat belt while driving or riding in a vehicle.  Do not drive if you have been drinking alcohol. Do not ride with someone who has been drinking.  Do not drive when you are tired or distracted. Do not text while driving.  Wear a helmet and other protective equipment during sports activities.  If you have firearms in your house, make sure you follow all gun safety procedures.  Seek help if you have been bullied, physically abused, or sexually abused.  Use the Internet responsibly to avoid dangers such as online bullying and online sex predators. What's next?  Go to your health care provider once a year for a well check visit.  Ask your health care provider how often you should have your eyes and teeth checked.  Stay up to date on all vaccines. This information is not intended to replace advice given to you by your health care provider. Make sure you discuss any questions you have with your health care provider. Document Revised: 01/14/2018 Document Reviewed: 01/14/2018 Elsevier Patient Education  2020 [place] Jlynn Ly M.D.

## 2019-09-02 ENCOUNTER — Ambulatory Visit (INDEPENDENT_AMBULATORY_CARE_PROVIDER_SITE_OTHER): Payer: BC Managed Care – PPO | Admitting: Internal Medicine

## 2019-09-02 ENCOUNTER — Other Ambulatory Visit: Payer: Self-pay

## 2019-09-02 ENCOUNTER — Encounter: Payer: Self-pay | Admitting: Internal Medicine

## 2019-09-02 VITALS — BP 112/76 | Temp 99.4°F | Ht 63.25 in | Wt 168.2 lb

## 2019-09-02 DIAGNOSIS — N926 Irregular menstruation, unspecified: Secondary | ICD-10-CM | POA: Diagnosis not present

## 2019-09-02 DIAGNOSIS — E049 Nontoxic goiter, unspecified: Secondary | ICD-10-CM | POA: Diagnosis not present

## 2019-09-02 DIAGNOSIS — Z0000 Encounter for general adult medical examination without abnormal findings: Secondary | ICD-10-CM | POA: Diagnosis not present

## 2019-09-02 NOTE — Patient Instructions (Addendum)
Lab today  Checking thyroid and blood sugar  Will get  Gyne  Opinion but I done suspect any serious issue .   Continue to track  Periods   Sometimes   ocass  irreg periods happen  And then goes back to normal  Without cause .      Preventive Care 9-24 Years Old, Female Preventive care refers to lifestyle choices and visits with your health care provider that can promote health and wellness. At this stage in your life, you may start seeing a primary care physician instead of a pediatrician. Your health care is now your responsibility. Preventive care for young adults includes:  A yearly physical exam. This is also called an annual wellness visit.  Regular dental and eye exams.  Immunizations.  Screening for certain conditions.  Healthy lifestyle choices, such as diet and exercise. What can I expect for my preventive care visit? Physical exam Your health care provider may check:  Height and weight. These may be used to calculate body mass index (BMI), which is a measurement that tells if you are at a healthy weight.  Heart rate and blood pressure.  Body temperature. Counseling Your health care provider may ask you questions about:  Past medical problems and family medical history.  Alcohol, tobacco, and drug use.  Home and relationship well-being.  Access to firearms.  Emotional well-being.  Diet, exercise, and sleep habits.  Sexual activity and sexual health.  Method of birth control.  Menstrual cycle.  Pregnancy history. What immunizations do I need?  Influenza (flu) vaccine  This is recommended every year. Tetanus, diphtheria, and pertussis (Tdap) vaccine  You may need a Td booster every 10 years. Varicella (chickenpox) vaccine  You may need this vaccine if you have not already been vaccinated. Human papillomavirus (HPV) vaccine  If recommended by your health care provider, you may need three doses over 6 months. Measles, mumps, and rubella (MMR)  vaccine  You may need at least one dose of MMR. You may also need a second dose. Meningococcal conjugate (MenACWY) vaccine  One dose is recommended if you are 24-17 years old and a Market researcher living in a residence hall, or if you have one of several medical conditions. You may also need additional booster doses. Pneumococcal conjugate (PCV13) vaccine  You may need this if you have certain conditions and were not previously vaccinated. Pneumococcal polysaccharide (PPSV23) vaccine  You may need one or two doses if you smoke cigarettes or if you have certain conditions. Hepatitis A vaccine  You may need this if you have certain conditions or if you travel or work in places where you may be exposed to hepatitis A. Hepatitis B vaccine  You may need this if you have certain conditions or if you travel or work in places where you may be exposed to hepatitis B. Haemophilus influenzae type b (Hib) vaccine  You may need this if you have certain risk factors. You may receive vaccines as individual doses or as more than one vaccine together in one shot (combination vaccines). Talk with your health care provider about the risks and benefits of combination vaccines. What tests do I need? Blood tests  Lipid and cholesterol levels. These may be checked every 5 years starting at age 24.  Hepatitis C test.  Hepatitis B test. Screening  Pelvic exam and Pap test. This may be done every 3 years starting at age 46.  Sexually transmitted disease (STD) testing, if you are at risk.  BRCA-related cancer screening. This may be done if you have a family history of breast, ovarian, tubal, or peritoneal cancers. Other tests  Tuberculosis skin test.  Vision and hearing tests.  Skin exam.  Breast exam. Follow these instructions at home: Eating and drinking   Eat a diet that includes fresh fruits and vegetables, whole grains, lean protein, and low-fat dairy products.  Drink enough  fluid to keep your urine pale yellow.  Do not drink alcohol if: ? Your health care provider tells you not to drink. ? You are pregnant, may be pregnant, or are planning to become pregnant. ? You are under the legal drinking age. In the [place] the legal drinking age is 37.  If you drink alcohol: ? Limit how much you have to 0-1 drink a day. ? Be aware of how much alcohol is in your drink. In the [place] one drink equals one 12 oz bottle of beer (355 mL), one 5 oz glass of wine (148 mL), or one 1 oz glass of hard liquor (44 mL). Lifestyle  Take daily care of your teeth and gums.  Stay active. Exercise at least 30 minutes 5 or more days of the week.  Do not use any products that contain nicotine or tobacco, such as cigarettes, e-cigarettes, and chewing tobacco. If you need help quitting, ask your health care provider.  Do not use drugs.  If you are sexually active, practice safe sex. Use a condom or other form of birth control (contraception) in order to prevent pregnancy and STIs (sexually transmitted infections). If you plan to become pregnant, see your health care provider for a pre-conception visit.  Find healthy ways to cope with stress, such as: ? Meditation, yoga, or listening to music. ? Journaling. ? Talking to a trusted person. ? Spending time with friends and family. Safety  Always wear your seat belt while driving or riding in a vehicle.  Do not drive if you have been drinking alcohol. Do not ride with someone who has been drinking.  Do not drive when you are tired or distracted. Do not text while driving.  Wear a helmet and other protective equipment during sports activities.  If you have firearms in your house, make sure you follow all gun safety procedures.  Seek help if you have been bullied, physically abused, or sexually abused.  Use the Internet responsibly to avoid dangers such as online bullying and online sex predators. What's next?  Go to your health care  provider once a year for a well check visit.  Ask your health care provider how often you should have your eyes and teeth checked.  Stay up to date on all vaccines. This information is not intended to replace advice given to you by your health care provider. Make sure you discuss any questions you have with your health care provider. Document Revised: 01/14/2018 Document Reviewed: 01/14/2018 Elsevier Patient Education  2020 [employer].

## 2019-09-03 LAB — T4, FREE: Free T4: 1.3 ng/dL (ref 0.8–1.8)

## 2019-09-03 LAB — CBC WITH DIFFERENTIAL/PLATELET
Absolute Monocytes: 621 cells/uL (ref 200–950)
Basophils Absolute: 49 cells/uL (ref 0–200)
Basophils Relative: 0.5 %
Eosinophils Absolute: 116 cells/uL (ref 15–500)
Eosinophils Relative: 1.2 %
HCT: 33.9 % — ABNORMAL LOW (ref 35.0–45.0)
Hemoglobin: 10.6 g/dL — ABNORMAL LOW (ref 11.7–15.5)
Lymphs Abs: 2474 cells/uL (ref 850–3900)
MCH: 23.7 pg — ABNORMAL LOW (ref 27.0–33.0)
MCHC: 31.3 g/dL — ABNORMAL LOW (ref 32.0–36.0)
MCV: 75.7 fL — ABNORMAL LOW (ref 80.0–100.0)
MPV: 10.7 fL (ref 7.5–12.5)
Monocytes Relative: 6.4 %
Neutro Abs: 6441 cells/uL (ref 1500–7800)
Neutrophils Relative %: 66.4 %
Platelets: 347 10*3/uL (ref 140–400)
RBC: 4.48 10*6/uL (ref 3.80–5.10)
RDW: 15.1 % — ABNORMAL HIGH (ref 11.0–15.0)
Total Lymphocyte: 25.5 %
WBC: 9.7 10*3/uL (ref 3.8–10.8)

## 2019-09-03 LAB — BASIC METABOLIC PANEL
BUN: 11 mg/dL (ref 7–25)
CO2: 23 mmol/L (ref 20–32)
Calcium: 9.3 mg/dL (ref 8.6–10.2)
Chloride: 105 mmol/L (ref 98–110)
Creat: 0.5 mg/dL (ref 0.50–1.10)
Glucose, Bld: 89 mg/dL (ref 65–99)
Potassium: 4 mmol/L (ref 3.5–5.3)
Sodium: 138 mmol/L (ref 135–146)

## 2019-09-03 LAB — HEPATIC FUNCTION PANEL
AG Ratio: 1.2 (calc) (ref 1.0–2.5)
ALT: 7 U/L (ref 6–29)
AST: 11 U/L (ref 10–30)
Albumin: 4.2 g/dL (ref 3.6–5.1)
Alkaline phosphatase (APISO): 51 U/L (ref 31–125)
Bilirubin, Direct: 0.1 mg/dL (ref 0.0–0.2)
Globulin: 3.4 g/dL (calc) (ref 1.9–3.7)
Indirect Bilirubin: 0.2 mg/dL (calc) (ref 0.2–1.2)
Total Bilirubin: 0.3 mg/dL (ref 0.2–1.2)
Total Protein: 7.6 g/dL (ref 6.1–8.1)

## 2019-09-03 LAB — TSH: TSH: 1.89 mIU/L

## 2019-09-03 LAB — HEMOGLOBIN A1C
Hgb A1c MFr Bld: 5.8 % of total Hgb — ABNORMAL HIGH (ref <5.7)
Mean Plasma Glucose: 120 (calc)
eAG (mmol/L): 6.6 (calc)

## 2019-09-03 LAB — T3, FREE: T3, Free: 3.4 pg/mL (ref 2.3–4.2)

## 2019-09-03 LAB — LIPID PANEL
Cholesterol: 175 mg/dL (ref <200)
HDL: 54 mg/dL (ref >=50)
LDL Cholesterol (Calc): 106 mg/dL (calc) — ABNORMAL HIGH
Non-HDL Cholesterol (Calc): 121 mg/dL (calc) (ref <130)
Total CHOL/HDL Ratio: 3.2 (calc) (ref <5.0)
Triglycerides: 67 mg/dL (ref <150)

## 2019-09-03 LAB — HCG, SERUM, QUALITATIVE: Preg, Serum: NEGATIVE

## 2019-09-04 NOTE — Progress Notes (Signed)
Thyroid tests are normal More anemai than last year  assuming this is irone deficicncy . Take iron every other day   otc ok ferrous sulfate or  other  Plan repeat   bc and diff and ferritin in 2 -3 months  and then ROV  /VV to make sure getting better. ( megan please arrange this)  GYn referral has been placed pending . You should contacted about this appt

## 2019-09-05 ENCOUNTER — Other Ambulatory Visit: Payer: Self-pay

## 2019-09-05 DIAGNOSIS — D649 Anemia, unspecified: Secondary | ICD-10-CM

## 2019-11-28 ENCOUNTER — Telehealth: Payer: Self-pay | Admitting: *Deleted

## 2019-11-28 NOTE — Telephone Encounter (Signed)
Spoke with pt advised that she should get a notification on her phone since pt is active on MyChart portal with lab results, advised to call the office if she does not get her results online

## 2019-11-28 NOTE — Telephone Encounter (Signed)
Patient called and changed her lab appointment to Friday instead of Thursday. Patient has an appointment with Dr Fabian Sharp on 11/2 patient wants to make sure her labs will be back by that appointment date.

## 2019-12-01 ENCOUNTER — Other Ambulatory Visit: Payer: BC Managed Care – PPO

## 2019-12-02 ENCOUNTER — Other Ambulatory Visit: Payer: Self-pay

## 2019-12-02 ENCOUNTER — Other Ambulatory Visit: Payer: BC Managed Care – PPO

## 2019-12-02 DIAGNOSIS — D649 Anemia, unspecified: Secondary | ICD-10-CM

## 2019-12-03 LAB — CBC WITH DIFFERENTIAL/PLATELET
Absolute Monocytes: 968 cells/uL — ABNORMAL HIGH (ref 200–950)
Basophils Absolute: 55 cells/uL (ref 0–200)
Basophils Relative: 0.5 %
Eosinophils Absolute: 66 cells/uL (ref 15–500)
Eosinophils Relative: 0.6 %
HCT: 33.1 % — ABNORMAL LOW (ref 35.0–45.0)
Hemoglobin: 10.4 g/dL — ABNORMAL LOW (ref 11.7–15.5)
Lymphs Abs: 3267 cells/uL (ref 850–3900)
MCH: 24.5 pg — ABNORMAL LOW (ref 27.0–33.0)
MCHC: 31.4 g/dL — ABNORMAL LOW (ref 32.0–36.0)
MCV: 77.9 fL — ABNORMAL LOW (ref 80.0–100.0)
MPV: 11.9 fL (ref 7.5–12.5)
Monocytes Relative: 8.8 %
Neutro Abs: 6644 cells/uL (ref 1500–7800)
Neutrophils Relative %: 60.4 %
Platelets: 295 10*3/uL (ref 140–400)
RBC: 4.25 10*6/uL (ref 3.80–5.10)
RDW: 15.2 % — ABNORMAL HIGH (ref 11.0–15.0)
Total Lymphocyte: 29.7 %
WBC: 11 10*3/uL — ABNORMAL HIGH (ref 3.8–10.8)

## 2019-12-03 LAB — FERRITIN: Ferritin: 10 ng/mL — ABNORMAL LOW (ref 16–154)

## 2019-12-06 ENCOUNTER — Other Ambulatory Visit: Payer: Self-pay

## 2019-12-06 ENCOUNTER — Ambulatory Visit: Payer: BC Managed Care – PPO | Admitting: Internal Medicine

## 2019-12-06 ENCOUNTER — Encounter: Payer: Self-pay | Admitting: Internal Medicine

## 2019-12-06 VITALS — BP 118/70 | HR 90 | Temp 98.2°F | Ht 63.25 in | Wt 170.0 lb

## 2019-12-06 DIAGNOSIS — E611 Iron deficiency: Secondary | ICD-10-CM

## 2019-12-06 DIAGNOSIS — D649 Anemia, unspecified: Secondary | ICD-10-CM | POA: Diagnosis not present

## 2019-12-06 NOTE — Patient Instructions (Signed)
Try taking iron every day  And can add stool  Softener  If needed .   Plan  Lab in 2-3 months or as needed .    Iron-Rich Diet  Iron is a mineral that helps your body to produce hemoglobin. Hemoglobin is a protein in red blood cells that carries oxygen to your body's tissues. Eating too little iron may cause you to feel weak and tired, and it can increase your risk of infection. Iron is naturally found in many foods, and many foods have iron added to them (iron-fortified foods). You may need to follow an iron-rich diet if you do not have enough iron in your body due to certain medical conditions. The amount of iron that you need each day depends on your age, your sex, and any medical conditions you have. Follow instructions from your health care provider or a diet and nutrition specialist (dietitian) about how much iron you should eat each day. What are tips for following this plan? Reading food labels  Check food labels to see how many milligrams (mg) of iron are in each serving. Cooking  Cook foods in pots and pans that are made from iron.  Take these steps to make it easier for your body to absorb iron from certain foods: ? Soak beans overnight before cooking. ? Soak whole grains overnight and drain them before using. ? Ferment flours before baking, such as by using yeast in bread dough. Meal planning  When you eat foods that contain iron, you should eat them with foods that are high in vitamin C. These include oranges, peppers, tomatoes, potatoes, and mango. Vitamin C helps your body to absorb iron. General information  Take iron supplements only as told by your health care provider. An overdose of iron can be life-threatening. If you were prescribed iron supplements, take them with orange juice or a vitamin C supplement.  When you eat iron-fortified foods or take an iron supplement, you should also eat foods that naturally contain iron, such as meat, poultry, and fish. Eating  naturally iron-rich foods helps your body to absorb the iron that is added to other foods or contained in a supplement.  Certain foods and drinks prevent your body from absorbing iron properly. Avoid eating these foods in the same meal as iron-rich foods or with iron supplements. These foods include: ? Coffee, black tea, and red wine. ? Milk, dairy products, and foods that are high in calcium. ? Beans and soybeans. ? Whole grains. What foods should I eat? Fruits Prunes. Raisins. Eat fruits high in vitamin C, such as oranges, grapefruits, and strawberries, alongside iron-rich foods. Vegetables Spinach (cooked). Green peas. Broccoli. Fermented vegetables. Eat vegetables high in vitamin C, such as leafy greens, potatoes, bell peppers, and tomatoes, alongside iron-rich foods. Grains Iron-fortified breakfast cereal. Iron-fortified whole-wheat bread. Enriched rice. Sprouted grains. Meats and other proteins Beef liver. Oysters. Beef. Shrimp. [place]. Chicken. Tuna. Sardines. Chickpeas. Nuts. Tofu. Pumpkin seeds. Beverages Tomato juice. Fresh orange juice. Prune juice. Hibiscus tea. Fortified instant breakfast shakes. Sweets and desserts Blackstrap molasses. Seasonings and condiments Tahini. Fermented soy sauce. Other foods Wheat germ. The items listed above may not be a complete list of recommended foods and beverages. Contact a dietitian for more information. What foods should I avoid? Grains Whole grains. Bran cereal. Bran flour. Oats. Meats and other proteins Soybeans. Products made from soy protein. Black beans. Lentils. Mung beans. Split peas. Dairy Milk. Cream. Cheese. Yogurt. Cottage cheese. Beverages Coffee. Black tea. Red wine.  Sweets and desserts Cocoa. Chocolate. Ice cream. Other foods Basil. Oregano. Large amounts of parsley. The items listed above may not be a complete list of foods and beverages to avoid. Contact a dietitian for more information. Summary  Iron is a  mineral that helps your body to produce hemoglobin. Hemoglobin is a protein in red blood cells that carries oxygen to your body's tissues.  Iron is naturally found in many foods, and many foods have iron added to them (iron-fortified foods).  When you eat foods that contain iron, you should eat them with foods that are high in vitamin C. Vitamin C helps your body to absorb iron.  Certain foods and drinks prevent your body from absorbing iron properly, such as whole grains and dairy products. You should avoid eating these foods in the same meal as iron-rich foods or with iron supplements. This information is not intended to replace advice given to you by your health care provider. Make sure you discuss any questions you have with your health care provider. Document Revised: 01/02/2017 Document Reviewed: 12/16/2016 Elsevier Patient Education  2020 [employer].

## 2019-12-06 NOTE — Progress Notes (Signed)
Chief Complaint  Patient presents with  . Follow-up    HPI: Brittany Simon 24 y.o. come in for fu anemia   Cold a lot   Taking  Iron  Otc.   325    3 x per week.  To avoid gi upset  No excess bleed  Periods  Missed July  And then August  Sept nl  Oct normal  Bleed 5-6  D on period has  Upcoming gyne appt  Had colon a few years ago   Rip Harbour has fissures  Regulates diet.  No red meat   But healthy mostly ' Getting more sleep helps stress  ROS: See pertinent positives and negatives per HPI.  Past Medical History:  Diagnosis Date  . Allergy   . Anemia 02/2018  . Asthma   . Prematurity, fetus 35-36 completed weeks of gestation     4 54z 36 weeks    Family History  Problem Relation Age of Onset  . Colon polyps Mother   . Asthma Father   . Colon cancer Neg Hx   . Esophageal cancer Neg Hx   . Rectal cancer Neg Hx   . Stomach cancer Neg Hx     Social History   Socioeconomic History  . Marital status: Single    Spouse name: Not on file  . Number of children: 0  . Years of education: Not on file  . Highest education level: Not on file  Occupational History  . Occupation: Runner, broadcasting/film/video  Tobacco Use  . Smoking status: Never Smoker  . Smokeless tobacco: Never Used  Vaping Use  . Vaping Use: Never used  Substance and Sexual Activity  . Alcohol use: Yes    Comment: occassionally   . Drug use: No  . Sexual activity: Not on file  Other Topics Concern  . Not on file  Social History Narrative   Single   Orig from [facility] [employer] [facility] of 4  One dog      IB program.    [employer].   [employer] fall 15   [employer]    Education major, spanish minor    [employer]  4th grade    Social Determinants of Health   Financial Resource Strain:   . Difficulty of Paying Living Expenses: Not on file  Food Insecurity:   . Worried About Programme researcher, broadcasting/film/video in the Last Year: Not on file  . Ran Out of Food in the Last Year: Not on file  Transportation Needs:   . Lack of Transportation  (Medical): Not on file  . Lack of Transportation (Non-Medical): Not on file  Physical Activity:   . Days of Exercise per Week: Not on file  . Minutes of Exercise per Session: Not on file  Stress:   . Feeling of Stress : Not on file  Social Connections:   . Frequency of Communication with Friends and Family: Not on file  . Frequency of Social Gatherings with Friends and Family: Not on file  . Attends Religious Services: Not on file  . Active Member of Clubs or Organizations: Not on file  . Attends Banker Meetings: Not on file  . Marital Status: Not on file    Outpatient Medications Prior to Visit  Medication Sig Dispense Refill  . albuterol (PROAIR HFA) 108 (90 Base) MCG/ACT inhaler Inhale 2 puffs into the lungs every 6 (six) hours as needed for wheezing. Needs follow up for refills. [phone] 6.7 g 0  .  betamethasone dipropionate 0.05 % lotion SMARTSIG:Sparingly Topical    . AMBULATORY NON FORMULARY MEDICATION Medication Name: Nitroglycerin 0.125% gel. Apply pea size amount to the rectum three times a day for 8 weeks (Patient not taking: Reported on 12/06/2019) 30 g 1  . fluticasone (FLONASE) 50 MCG/ACT nasal spray 2 spray each nostril qd (Patient not taking: Reported on 12/06/2019) 16 g 3   No facility-administered medications prior to visit.     EXAM:  BP 118/70 (BP Location: Right Arm, Patient Position: Sitting, Cuff Size: Normal)   Pulse 90   Temp 98.2 F (36.8 C) (Oral)   Ht 5' 3.25" (1.607 m)   Wt 170 lb (77.1 kg)   SpO2 99%   BMI 29.88 kg/m   Body mass index is 29.88 kg/m.  GENERAL: vitals reviewed and listed above, alert, oriented, appears well hydrated and in no acute distress HEENT: atraumatic, conjunctiva  clear, no obvious abnormalities on inspection of external nose and ears OP : masked   NECK: no obvious masses on inspection palpation  Abdomen:  Sof,t normal bowel sounds without hepatosplenomegaly, no guarding rebound or masses no CVA  tenderness Ln no adneopathy  CV: HRRR, no clubbing cyanosis or  peripheral edema nl cap refill  MS: moves all extremities without noticeable focal  abnormality PSYCH: pleasant and cooperative, no obvious depression or anxiety Lab Results  Component Value Date   WBC 11.0 (H) 12/02/2019   HGB 10.4 (L) 12/02/2019   HCT 33.1 (L) 12/02/2019   PLT 295 12/02/2019   GLUCOSE 89 09/02/2019   CHOL 175 09/02/2019   TRIG 67 09/02/2019   HDL 54 09/02/2019   LDLCALC 106 (H) 09/02/2019   ALT 7 09/02/2019   AST 11 09/02/2019   NA 138 09/02/2019   K 4.0 09/02/2019   CL 105 09/02/2019   CREATININE 0.50 09/02/2019   BUN 11 09/02/2019   CO2 23 09/02/2019   TSH 1.89 09/02/2019   HGBA1C 5.8 (H) 09/02/2019   BP Readings from Last 3 Encounters:  12/06/19 118/70  09/02/19 112/76  06/10/18 122/74    ASSESSMENT AND PLAN:  Discussed the following assessment and plan:  Iron deficiency - Plan: CBC with Differential/Platelet, Iron, TIBC and Ferritin Panel, Reticulocytes  Anemia, unspecified type - Plan: CBC with Differential/Platelet, Iron, TIBC and Ferritin Panel, Reticulocytes Hg a1c borderline   No bleeding disorder seen  Nl exam  Inc to daily iron  Keep gyne appt  Plan fu labs in 2-3 mos and go from there  dont suspect   Other cause of anemia at this time  No ppis other   obv issues  Gi diseases  -Patient advised to return or notify health care team  if  new concerns arise.  Patient Instructions  Try taking iron every day  And can add stool  Softener  If needed .   Plan  Lab in 2-3 months or as needed .    Iron-Rich Diet  Iron is a mineral that helps your body to produce hemoglobin. Hemoglobin is a protein in red blood cells that carries oxygen to your body's tissues. Eating too little iron may cause you to feel weak and tired, and it can increase your risk of infection. Iron is naturally found in many foods, and many foods have iron added to them (iron-fortified foods). You may need  to follow an iron-rich diet if you do not have enough iron in your body due to certain medical conditions. The amount of iron that you need each  day depends on your age, your sex, and any medical conditions you have. Follow instructions from your health care provider or a diet and nutrition specialist (dietitian) about how much iron you should eat each day. What are tips for following this plan? Reading food labels  Check food labels to see how many milligrams (mg) of iron are in each serving. Cooking  Cook foods in pots and pans that are made from iron.  Take these steps to make it easier for your body to absorb iron from certain foods: ? Soak beans overnight before cooking. ? Soak whole grains overnight and drain them before using. ? Ferment flours before baking, such as by using yeast in bread dough. Meal planning  When you eat foods that contain iron, you should eat them with foods that are high in vitamin C. These include oranges, peppers, tomatoes, potatoes, and mango. Vitamin C helps your body to absorb iron. General information  Take iron supplements only as told by your health care provider. An overdose of iron can be life-threatening. If you were prescribed iron supplements, take them with orange juice or a vitamin C supplement.  When you eat iron-fortified foods or take an iron supplement, you should also eat foods that naturally contain iron, such as meat, poultry, and fish. Eating naturally iron-rich foods helps your body to absorb the iron that is added to other foods or contained in a supplement.  Certain foods and drinks prevent your body from absorbing iron properly. Avoid eating these foods in the same meal as iron-rich foods or with iron supplements. These foods include: ? Coffee, black tea, and red wine. ? Milk, dairy products, and foods that are high in calcium. ? Beans and soybeans. ? Whole grains. What foods should I eat? Fruits Prunes. Raisins. Eat fruits high in  vitamin C, such as oranges, grapefruits, and strawberries, alongside iron-rich foods. Vegetables Spinach (cooked). Green peas. Broccoli. Fermented vegetables. Eat vegetables high in vitamin C, such as leafy greens, potatoes, bell peppers, and tomatoes, alongside iron-rich foods. Grains Iron-fortified breakfast cereal. Iron-fortified whole-wheat bread. Enriched rice. Sprouted grains. Meats and other proteins Beef liver. Oysters. Beef. Shrimp. [place]. Chicken. Tuna. Sardines. Chickpeas. Nuts. Tofu. Pumpkin seeds. Beverages Tomato juice. Fresh orange juice. Prune juice. Hibiscus tea. Fortified instant breakfast shakes. Sweets and desserts Blackstrap molasses. Seasonings and condiments Tahini. Fermented soy sauce. Other foods Wheat germ. The items listed above may not be a complete list of recommended foods and beverages. Contact a dietitian for more information. What foods should I avoid? Grains Whole grains. Bran cereal. Bran flour. Oats. Meats and other proteins Soybeans. Products made from soy protein. Black beans. Lentils. Mung beans. Split peas. Dairy Milk. Cream. Cheese. Yogurt. Cottage cheese. Beverages Coffee. Black tea. Red wine. Sweets and desserts Cocoa. Chocolate. Ice cream. Other foods Basil. Oregano. Large amounts of parsley. The items listed above may not be a complete list of foods and beverages to avoid. Contact a dietitian for more information. Summary  Iron is a mineral that helps your body to produce hemoglobin. Hemoglobin is a protein in red blood cells that carries oxygen to your body's tissues.  Iron is naturally found in many foods, and many foods have iron added to them (iron-fortified foods).  When you eat foods that contain iron, you should eat them with foods that are high in vitamin C. Vitamin C helps your body to absorb iron.  Certain foods and drinks prevent your body from absorbing iron properly, such as whole grains and  dairy products. You should  avoid eating these foods in the same meal as iron-rich foods or with iron supplements. This information is not intended to replace advice given to you by your health care provider. Make sure you discuss any questions you have with your health care provider. Document Revised: 01/02/2017 Document Reviewed: 12/16/2016 Elsevier Patient Education  2020 [employer].     Honaker K. Adonia Porada M.D.

## 2020-03-27 ENCOUNTER — Other Ambulatory Visit: Payer: Self-pay

## 2020-03-27 MED ORDER — ALBUTEROL SULFATE HFA 108 (90 BASE) MCG/ACT IN AERS: 2.0000 | INHALATION_SPRAY | Freq: Four times a day (QID) | RESPIRATORY_TRACT | 0 refills | Status: DC | PRN

## 2020-05-16 ENCOUNTER — Ambulatory Visit (INDEPENDENT_AMBULATORY_CARE_PROVIDER_SITE_OTHER): Payer: BC Managed Care – PPO | Admitting: Internal Medicine

## 2020-05-16 ENCOUNTER — Other Ambulatory Visit: Payer: Self-pay

## 2020-05-16 ENCOUNTER — Encounter: Payer: Self-pay | Admitting: Internal Medicine

## 2020-05-16 VITALS — BP 96/62 | Temp 98.4°F | Ht 63.25 in | Wt 178.2 lb

## 2020-05-16 DIAGNOSIS — Z79899 Other long term (current) drug therapy: Secondary | ICD-10-CM

## 2020-05-16 DIAGNOSIS — J4521 Mild intermittent asthma with (acute) exacerbation: Secondary | ICD-10-CM

## 2020-05-16 DIAGNOSIS — J302 Other seasonal allergic rhinitis: Secondary | ICD-10-CM

## 2020-05-16 DIAGNOSIS — R053 Chronic cough: Secondary | ICD-10-CM

## 2020-05-16 MED ORDER — FLUTICASONE FUROATE-VILANTEROL 100-25 MCG/INH IN AEPB: 1.0000 | INHALATION_SPRAY | Freq: Every day | RESPIRATORY_TRACT | 6 refills | Status: DC

## 2020-05-16 NOTE — Patient Instructions (Signed)
Add controller inhaler for asthma allergy. In se[place]on  Add flonase  To your antihistamine. Also  Let us know if  Worsening     Asthma Attack Prevention, Adult Although you may not be able to control the fact that you have asthma, you can take actions to prevent episodes of asthma (asthma attacks). How can this condition affect me? Asthma attacks (flare ups) can cause trouble breathing, wheezing, and coughing. They may keep you from doing activities you like to do. What can increase my risk? Coming into contact with things that cause asthma symptoms (asthma triggers) can put you at risk for an asthma attack. Common asthma triggers include:  Things you are allergic to (allergens), such as: ? Dust mite and cockroach droppings. ? Pet dander. ? Mold. ? Pollen from trees and grasses. ? Food allergies. This might be a specific food or added chemicals called sulfites.  Irritants, such as: ? Weather changes including very cold, dry, or humid air. ? Smoke. This includes campfire smoke, air pollution, and tobacco smoke. ? Strong odors from aerosol sprays and fumes from perfume, candles, and household cleaners.  Other triggers, such as: ? Certain medicines. This includes NSAIDs, such as ibuprofen and aspirin. ? Viral respiratory infections (colds), including runny nose (rhinitis) or infection in the sinuses (sinusitis). ? Activity including exercise, laughing, or crying. ? Not using inhaled medicines (corticosteroids) as told. What actions can I take to prevent an asthma attack?  Stay healthy. Stay up to date on all immunizations as told by your health care provider, including the yearly flu (influenza) vaccine and pneumonia vaccine.  Many asthma attacks can be prevented by carefully following your written asthma action plan. Follow your asthma action plan Work with your health care provider to create a written asthma action plan. This plan should include:  A list of your asthma triggers  and how to avoid or reduce them.  A list of symptoms that you may have during an asthma attack.  Information about which medicine to take, when to take the medicine, and how much of the medicine to take.  Information to help you understand your peak flow measurements.  Daily actions that you can take to prevent (control) your asthma symptoms.  Contact information for your health care providers.  If you have an asthma attack, act quickly. Follow the emergency steps on your written asthma action plan. This may prevent you from needing to go to the hospital. Monitor your asthma. To do this:  Use your peak flow meter every morning and every evening for 2-3 weeks or as told by your health care provider. ? Record the results in a journal. ? A drop in your peak flow numbers on one or more days may mean that you are starting to have an asthma attack, even if you are not having symptoms.  When you have asthma symptoms, write them down in a journal.  Write down in your journal how often you need to use your fast-acting rescue inhaler. If you are using your rescue inhaler more often, it may mean that your asthma is not under control. Talk with your health care provider about adjusting your asthma treatment plan to help you prevent future asthma attacks and gain better control of your condition.   Lifestyle  Avoid or reduce contact with known outdoor allergens by staying indoors, keeping windows closed, and using air conditioning when pollen and mold counts are high.  Do not use any products that contain nicotine or tobacco, such  as cigarettes, e-cigarettes, and chewing tobacco. If you need help quitting, ask your health care provider.  If you are overweight, consider a weight-loss plan.  Find ways to cope with stress and your feelings, such as mindfulness, relaxation, or breathing exercises.  Ask your health care provider if a breathing exercise program (pulmonary rehabilitation) may be helpful  to control symptoms and improve your quality of life. Medicines  Take over-the-counter and prescription medicines only as told by your health care provider.  Do not stop taking your medicine and do not take less medicine even if you are doing well.  Let your health care provider know: ? How often you use your rescue inhaler. ? How often you have symptoms when you are taking your regular medicines. ? If you wake up at night because of asthma symptoms. ? If you have more trouble with your breathing when you exercise.   Activity  Do your normal activities as told by your health care provider. Ask your health care provider what activities are safe for you.  Some people have asthma symptoms or more asthma symptoms when they exercise. This is called exercise-induced bronchoconstriction (EIB). If you have this problem, talk with your health care provider about how to manage EIB. Some tips to follow include: ? Use your fast-acting inhaler before exercise. ? Exercise indoors if it is very cold, humid, or the pollen and mold counts are high. ? Warm up and cool down before and after exercise. ? Stop exercising right away if your asthma symptoms start or get worse. Where to find more information  Asthma and Allergy Foundation of America: www.aafa.org  Centers for Disease Control and Prevention: FootballExhibition.com.br  American Lung Association: www.lung.org  National Heart, Lung, and Blood Institute: PopSteam.is  World Health Organization: https://castaneda-walker.com/ Get help right away if:  You have followed your written asthma action plan and your symptoms are not improving. Summary  Asthma attacks (flare ups) can cause trouble breathing, wheezing, and coughing. They may keep you from doing activities you normally like to do.  Work with your health care provider to create a written asthma action plan.  Do not stop taking your medicine and do not take less medicine even if you are doing well.  Do not use  any products that contain nicotine or tobacco, such as cigarettes, e-cigarettes, and chewing tobacco. If you need help quitting, ask your health care provider. This information is not intended to replace advice given to you by your health care provider. Make sure you discuss any questions you have with your health care provider. Document Revised: 01/18/2019 Document Reviewed: 01/18/2019 Elsevier Patient Education  2021 [employer].   a

## 2020-05-16 NOTE — Progress Notes (Signed)
Chief Complaint  Patient presents with  . Asthma    HPI: Brittany Simon 24 y.o. come in for symptoms of cough and wheezing felt to be asthmatic off and on for About a month  Back on Claritin or allegra.   And inhaler as needed . Itching and sneezing helps.  I the antihistamineAllergies   Mitigation.  Ginger tea.    Dry cough . Chest tight wheezing  And throat  Tight.  Last weekend was  Feeling worse at night.  Call for appointment.  Feeling better today  Albuterol was felt to not be working   Long term 30 - 45  Minutes  Now better .  Is a teacher some exposures Tends to get coughing asthma allergy symptoms in the spring and is gone by June usually.  No history of childhood serious asthma but does run in the family has not been on a controller inhaler in the past. ROS: See pertinent positives and negatives per HPI. Teaches school.  Has not had COVID or COVID symptoms with this. Plan travel to [place] this summer with tour group [place]. Past Medical History:  Diagnosis Date  . Allergy   . Anemia 02/2018  . Asthma   . Prematurity, fetus 35-36 completed weeks of gestation     4 46z 36 weeks    Family History  Problem Relation Age of Onset  . Colon polyps Mother   . Asthma Father   . Colon cancer Neg Hx   . Esophageal cancer Neg Hx   . Rectal cancer Neg Hx   . Stomach cancer Neg Hx     Social History   Socioeconomic History  . Marital status: Single    Spouse name: Not on file  . Number of children: 0  . Years of education: Not on file  . Highest education level: Not on file  Occupational History  . Occupation: Runner, broadcasting/film/video  Tobacco Use  . Smoking status: Never Smoker  . Smokeless tobacco: Never Used  Vaping Use  . Vaping Use: Never used  Substance and Sexual Activity  . Alcohol use: Yes    Comment: occassionally   . Drug use: No  . Sexual activity: Not on file  Other Topics Concern  . Not on file  Social History Narrative   Single   Orig from [facility] of 4  One dog      IB program.    [employer].   [employer] fall 15   [employer]s [employer]    Education major, spanish minor    [employer]  4th grade    Social Determinants of Corporate investment banker Strain: Not on file  Food Insecurity: Not on file  Transportation Needs: Not on file  Physical Activity: Not on file  Stress: Not on file  Social Connections: Not on file    Outpatient Medications Prior to Visit  Medication Sig Dispense Refill  . albuterol (PROAIR HFA) 108 (90 Base) MCG/ACT inhaler Inhale 2 puffs into the lungs every 6 (six) hours as needed for wheezing. Needs follow up for refills. [phone] 20.1 g 0  . betamethasone dipropionate 0.05 % lotion SMARTSIG:Sparingly Topical    . fluticasone (FLONASE) 50 MCG/ACT nasal spray 2 spray each nostril qd 16 g 3  . AMBULATORY NON FORMULARY MEDICATION Medication Name: Nitroglycerin 0.125% gel. Apply pea size amount to the rectum three times a day for 8 weeks (Patient not taking: Reported on 12/06/2019) 30 g 1   No  facility-administered medications prior to visit.     EXAM:  BP 96/62 (BP Location: Left Arm, Patient Position: Sitting, Cuff Size: Large)   Temp 98.4 F (36.9 C) (Oral)   Ht 5' 3.25" (1.607 m)   Wt 178 lb 3.2 oz (80.8 kg)   BMI 31.32 kg/m   Body mass index is 31.32 kg/m.  GENERAL: vitals reviewed and listed above, alert, oriented, appears well hydrated and in no acute distress mildly hoarse occasional dry cough no respiratory distress. HEENT: atraumatic, conjunctiva  clear, no obvious abnormalities on inspection of external nose and ears TMs are clear OP : Masked NECK: no obvious masses on inspection palpation  LUNGS: clear to auscultation bilaterally, no wheezes, rales or rhonchi, slight decrease in air movement but no retractions or difficulties. CV: HRRR, no clubbing cyanosis or  peripheral edema nl cap refill  MS: moves all extremities without noticeable focal  abnormality PSYCH: pleasant and cooperative, no  obvious depression or anxiety  BP Readings from Last 3 Encounters:  05/16/20 96/62  12/06/19 118/70  09/02/19 112/76    ASSESSMENT AND PLAN:  Discussed the following assessment and plan:  Intermittent asthma with acute exacerbation, unspecified asthma severity  Seasonal allergic rhinitis, unspecified trigger  Medication management  Cough, persistent Suspected cough variant asthma has seasonal allergic symptoms optimize upper respiratory control add controller medicine Breo 1 puff a day add Flonase if needed we can add a steroid burst but not amenable to this at this time albuterol as rescue Would stay on controller medicine through the allergy season and then as needed.  -Patient advised to return or notify health care team  if  new concerns arise.  Patient Instructions   Add controller inhaler for asthma allergy. In seasaon  Add flonase  To your antihistamine. Also  Let us know if  Worsening     Asthma Attack Prevention, Adult Although you may not be able to control the fact that you have asthma, you can take actions to prevent episodes of asthma (asthma attacks). How can this condition affect me? Asthma attacks (flare ups) can cause trouble breathing, wheezing, and coughing. They may keep you from doing activities you like to do. What can increase my risk? Coming into contact with things that cause asthma symptoms (asthma triggers) can put you at risk for an asthma attack. Common asthma triggers include:  Things you are allergic to (allergens), such as: ? Dust mite and cockroach droppings. ? Pet dander. ? Mold. ? Pollen from trees and grasses. ? Food allergies. This might be a specific food or added chemicals called sulfites.  Irritants, such as: ? Weather changes including very cold, dry, or humid air. ? Smoke. This includes campfire smoke, air pollution, and tobacco smoke. ? Strong odors from aerosol sprays and fumes from perfume, candles, and household  cleaners.  Other triggers, such as: ? Certain medicines. This includes NSAIDs, such as ibuprofen and aspirin. ? Viral respiratory infections (colds), including runny nose (rhinitis) or infection in the sinuses (sinusitis). ? Activity including exercise, laughing, or crying. ? Not using inhaled medicines (corticosteroids) as told. What actions can I take to prevent an asthma attack?  Stay healthy. Stay up to date on all immunizations as told by your health care provider, including the yearly flu (influenza) vaccine and pneumonia vaccine.  Many asthma attacks can be prevented by carefully following your written asthma action plan. Follow your asthma action plan Work with your health care provider to create a written asthma action plan. This  plan should include:  A list of your asthma triggers and how to avoid or reduce them.  A list of symptoms that you may have during an asthma attack.  Information about which medicine to take, when to take the medicine, and how much of the medicine to take.  Information to help you understand your peak flow measurements.  Daily actions that you can take to prevent (control) your asthma symptoms.  Contact information for your health care providers.  If you have an asthma attack, act quickly. Follow the emergency steps on your written asthma action plan. This may prevent you from needing to go to the hospital. Monitor your asthma. To do this:  Use your peak flow meter every morning and every evening for 2-3 weeks or as told by your health care provider. ? Record the results in a journal. ? A drop in your peak flow numbers on one or more days may mean that you are starting to have an asthma attack, even if you are not having symptoms.  When you have asthma symptoms, write them down in a journal.  Write down in your journal how often you need to use your fast-acting rescue inhaler. If you are using your rescue inhaler more often, it may mean that your  asthma is not under control. Talk with your health care provider about adjusting your asthma treatment plan to help you prevent future asthma attacks and gain better control of your condition.   Lifestyle  Avoid or reduce contact with known outdoor allergens by staying indoors, keeping windows closed, and using air conditioning when pollen and mold counts are high.  Do not use any products that contain nicotine or tobacco, such as cigarettes, e-cigarettes, and chewing tobacco. If you need help quitting, ask your health care provider.  If you are overweight, consider a weight-loss plan.  Find ways to cope with stress and your feelings, such as mindfulness, relaxation, or breathing exercises.  Ask your health care provider if a breathing exercise program (pulmonary rehabilitation) may be helpful to control symptoms and improve your quality of life. Medicines  Take over-the-counter and prescription medicines only as told by your health care provider.  Do not stop taking your medicine and do not take less medicine even if you are doing well.  Let your health care provider know: ? How often you use your rescue inhaler. ? How often you have symptoms when you are taking your regular medicines. ? If you wake up at night because of asthma symptoms. ? If you have more trouble with your breathing when you exercise.   Activity  Do your normal activities as told by your health care provider. Ask your health care provider what activities are safe for you.  Some people have asthma symptoms or more asthma symptoms when they exercise. This is called exercise-induced bronchoconstriction (EIB). If you have this problem, talk with your health care provider about how to manage EIB. Some tips to follow include: ? Use your fast-acting inhaler before exercise. ? Exercise indoors if it is very cold, humid, or the pollen and mold counts are high. ? Warm up and cool down before and after exercise. ? Stop  exercising right away if your asthma symptoms start or get worse. Where to find more information  Asthma and Allergy Foundation of America: www.aafa.org  Centers for Disease Control and Prevention: FootballExhibition.com.br  American Lung Association: www.lung.org  National Heart, Lung, and Blood Institute: PopSteam.is  World Health Organization: https://castaneda-walker.com/ Get help right  away if:  You have followed your written asthma action plan and your symptoms are not improving. Summary  Asthma attacks (flare ups) can cause trouble breathing, wheezing, and coughing. They may keep you from doing activities you normally like to do.  Work with your health care provider to create a written asthma action plan.  Do not stop taking your medicine and do not take less medicine even if you are doing well.  Do not use any products that contain nicotine or tobacco, such as cigarettes, e-cigarettes, and chewing tobacco. If you need help quitting, ask your health care provider. This information is not intended to replace advice given to you by your health care provider. Make sure you discuss any questions you have with your health care provider. Document Revised: 01/18/2019 Document Reviewed: 01/18/2019 Elsevier Patient Education  2021 [employer]   a          Neta MendsWanda K. Bayard More M.D.

## 2020-05-21 ENCOUNTER — Ambulatory Visit: Payer: BC Managed Care – PPO | Admitting: Internal Medicine

## 2020-08-06 ENCOUNTER — Encounter (HOSPITAL_BASED_OUTPATIENT_CLINIC_OR_DEPARTMENT_OTHER): Payer: Self-pay | Admitting: *Deleted

## 2020-08-06 ENCOUNTER — Other Ambulatory Visit: Payer: Self-pay

## 2020-08-06 ENCOUNTER — Emergency Department (HOSPITAL_BASED_OUTPATIENT_CLINIC_OR_DEPARTMENT_OTHER)
Admission: EM | Admit: 2020-08-06 | Discharge: 2020-08-06 | Disposition: A | Discharge status: Home / Self Care | Payer: BC Managed Care – PPO | Attending: Emergency Medicine | Admitting: Emergency Medicine

## 2020-08-06 DIAGNOSIS — Z7951 Long term (current) use of inhaled steroids: Secondary | ICD-10-CM | POA: Diagnosis not present

## 2020-08-06 DIAGNOSIS — J4521 Mild intermittent asthma with (acute) exacerbation: Secondary | ICD-10-CM | POA: Insufficient documentation

## 2020-08-06 DIAGNOSIS — R0602 Shortness of breath: Secondary | ICD-10-CM | POA: Diagnosis present

## 2020-08-06 MED ORDER — ALBUTEROL SULFATE HFA 108 (90 BASE) MCG/ACT IN AERS
4.0000 | INHALATION_SPRAY | Freq: Once | RESPIRATORY_TRACT | Status: AC
  Administered 2020-08-06: 4 via RESPIRATORY_TRACT
  Filled 2020-08-06: qty 6.7

## 2020-08-06 MED ORDER — DEXAMETHASONE SODIUM PHOSPHATE 10 MG/ML IJ SOLN
10.0000 mg | Freq: Once | INTRAMUSCULAR | Status: AC
  Administered 2020-08-06: 10 mg via INTRAMUSCULAR
  Filled 2020-08-06: qty 1

## 2020-08-06 MED ORDER — IPRATROPIUM-ALBUTEROL 0.5-2.5 (3) MG/3ML IN SOLN
3.0000 mL | Freq: Once | RESPIRATORY_TRACT | Status: AC
  Administered 2020-08-06: 3 mL via RESPIRATORY_TRACT
  Filled 2020-08-06: qty 3

## 2020-08-06 NOTE — Discharge Instructions (Addendum)
Continue every 2-4 hour albuterol treatments for the next 12 hours.

## 2020-08-06 NOTE — ED Notes (Signed)
Following neb treatment, pt states breathing has improved. BBS diminished with slight expiratory wheeze in RUL

## 2020-08-06 NOTE — ED Provider Notes (Signed)
[facility] EMERGENCY DEPT Provider Note   CSN: [id number] Arrival date & time: 08/06/20  0453     History Chief Complaint  Patient presents with   Shortness of Breath    Brittany Simon is a 25 y.o. female.  HPI     This is a 25 year old female with a history of asthma, anemia who presents with asthma exacerbation.  Patient reports she has been short of breath for the last several hours.  She took 4 puffs of her inhaler with out significant relief.  She states her asthma has been acting up over the last several weeks.  She normally gets asthma exacerbations with allergies but recently she has had more shortness of breath and symptoms regularly.  She denies chest pain, recent fevers, cough.  No known infectious symptoms or sick contacts.  She is never been hospitalized for her asthma.  No recent steroid treatment.  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 No pertinent surgical history.   OB History   No obstetric history on file.     Family History  Problem Relation Age of Onset   Colon polyps Mother    Asthma Father    Colon cancer Neg Hx    Esophageal cancer Neg Hx    Rectal cancer Neg Hx    Stomach cancer Neg Hx     Social History   Tobacco Use   Smoking status: Never   Smokeless tobacco: Never  Vaping Use   Vaping Use: Never used  Substance Use Topics   Alcohol use: Yes    Comment: occassionally    Drug use: No    Home Medications Prior to Admission  medications   Medication Sig Start Date End Date Taking? Authorizing Provider  albuterol (PROAIR HFA) 108 (90 Base) MCG/ACT inhaler Inhale 2 puffs into the lungs every 6 (six) hours as needed for wheezing. Needs follow up for refills. [phone] 03/27/20   Madelin Headings, MD  betamethasone dipropionate 0.05 % lotion SMARTSIG:Sparingly Topical 05/16/19   [provider]  fluticasone (FLONASE) 50 MCG/ACT nasal spray 2 spray each nostril qd 05/25/12   Panosh, Neta Mends, MD  fluticasone furoate-vilanterol (BREO ELLIPTA) 100-25 MCG/INH AEPB Inhale 1 puff into the lungs daily. For asthma 05/16/20   Panosh, Neta Mends, MD    Allergies    Patient has no known allergies.  Review of Systems   Review of Systems  Constitutional:  Negative for fever.  Respiratory:  Positive for shortness of breath and wheezing. Negative for cough.   Cardiovascular:  Negative for chest pain.  Gastrointestinal:  Negative for abdominal pain, nausea and vomiting.  All other systems reviewed and are negative.  Physical Exam Updated Vital Signs BP 136/77 (BP Location: Right Arm)   Pulse (!) 120   Temp 97.7 F (36.5 C)   Resp (!) 24   Ht 1.575 m (5\' 2" )   Wt 74.8 kg   LMP 07/30/2020 (Approximate)   SpO2 97%  BMI 30.18 kg/m   Physical Exam Vitals and nursing note reviewed.  Constitutional:      Appearance: She is well-developed. She is not ill-appearing.  HENT:     Head: Normocephalic and atraumatic.  Eyes:     Pupils: Pupils are equal, round, and reactive to light.  Cardiovascular:     Rate and Rhythm: Normal rate and regular rhythm.     Heart sounds: Normal heart sounds.  Pulmonary:     Effort: Pulmonary effort is normal. No respiratory distress.     Breath sounds: Wheezing present.     Comments: Diminished air movement in all lung fields, expiratory wheeze noted lower lung fields, mildly tachypneic Abdominal:     General: Bowel sounds are normal.     Palpations: Abdomen is soft.  Musculoskeletal:      Cervical back: Neck supple.     Right lower leg: No tenderness. No edema.     Left lower leg: No tenderness. No edema.  Skin:    General: Skin is warm and dry.  Neurological:     Mental Status: She is alert and oriented to person, place, and time.  Psychiatric:        Mood and Affect: Mood normal.    ED Results / Procedures / Treatments   Labs (all labs ordered are listed, but only abnormal results are displayed) Labs Reviewed - No data to display  EKG None  Radiology No results found.  Procedures Procedures   Medications Ordered in ED Medications  ipratropium-albuterol (DUONEB) 0.5-2.5 (3) MG/3ML nebulizer solution 3 mL (3 mLs Nebulization Given 08/06/20 0516)  dexamethasone (DECADRON) injection 10 mg (10 mg Intramuscular Given 08/06/20 0517)  albuterol (VENTOLIN HFA) 108 (90 Base) MCG/ACT inhaler 4 puff (4 puffs Inhalation Given 08/06/20 0555)    ED Course  I have reviewed the triage vital signs and the nursing notes.  Pertinent labs & imaging results that were available during my care of the patient were reviewed by me and considered in my medical decision making (see chart for details).  Clinical Course as of 08/06/20 8588  [facility] Aug 06, 2020  5027 Patient reports significant improvement.  Better aeration.  Still has an occasional expiratory wheeze.  She was given a new inhaler and ambulated with pulse ox. [CH]    Clinical Course User Index [CH] Constant Mandeville, Mayer Masker, MD   MDM Rules/Calculators/A&P                          Patient presents with concerns for asthma exacerbation.  She is overall nontoxic.  Mildly tachypneic slightly tachycardic.  She did use albuterol prior to evaluation.  O2 sats 95 to 97% on room air.  Patient was given a DuoNeb and a dose of Decadron.  She denies infectious symptoms.  Doubt pneumonia.  No indication for x-ray at this time.  See clinical course above.  Patient improved after 1 DuoNeb.  Aeration is better with minimal wheeze.  She was  given a second dose of albuterol through inhaler.  She maintained O2 sats without difficulty.  Will discharge home with new inhaler.  Recommend every 2 to 4-hour inhaler use for the next 12 hours.  After history, exam, and medical workup I feel the patient has been appropriately medically screened and is safe for discharge home. Pertinent diagnoses were discussed with the patient. Patient was given return precautions.    Final Clinical Impression(s) / ED Diagnoses Final diagnoses:  Mild intermittent asthma  with exacerbation    Rx / DC Orders ED Discharge Orders     None        Karolyna Bianchini, Mayer Masker, MD 08/06/20 [phone]

## 2020-08-06 NOTE — ED Triage Notes (Addendum)
Pt c/o asthma exacerbation that started this morning. States she has had issues with asthma for the past couple of weeks. Has used 4 puffs this morning of her albuterol with no relief.  Denies any fevers. 02 sats 97% on RA. Able to speak in complete sentences. Mild sob on arrival.

## 2020-08-07 ENCOUNTER — Other Ambulatory Visit: Payer: Self-pay

## 2020-08-07 NOTE — Progress Notes (Signed)
Chief Complaint  Patient presents with   Follow-up    HPI: Brittany Simon 24 y.o. come in for Fu asthma Ed visit  on July 4  after an acute  attack .  She had recently had some nasal throat itching runny nose and was taking her Breo every other day but had been okay until late night difficulties with wheezing. Since the nebulizer daily Breo and the steroid boost she has been doing much better. Is getting ready to travel to [place] at the end of the week. Does not know which she is allergic to there is a family history of allergy and asthma. Usually has to use her albuterol only once or twice a week at most.  Patient presents with concerns for asthma exacerbation.  She is overall nontoxic.  Mildly tachypneic slightly tachycardic.  She did use albuterol prior to evaluation.  O2 sats 95 to 97% on room air.  Patient was given a DuoNeb and a dose of Decadron.  She denies infectious symptoms.  Doubt pneumonia.  No indication for x-ray at this time.  See clinical course above.  Patient improved after 1 DuoNeb.  Aeration is better with minimal wheeze.  She was given a second dose of albuterol through inhaler.  She maintained O2 sats without difficulty.  Will discharge home with new inhaler.  Recommend every 2 to 4-hour inhaler use for the next 12 hours. ROS: See pertinent positives and negatives per HPI.  Past Medical History:  Diagnosis Date   Allergy    Anemia 02/2018   Asthma    Prematurity, fetus 35-36 completed weeks of gestation     4 78z 36 weeks    Family History  Problem Relation Age of Onset   Colon polyps Mother    Asthma Father    Colon cancer Neg Hx    Esophageal cancer Neg Hx    Rectal cancer Neg Hx    Stomach cancer Neg Hx     Social History   Socioeconomic History   Marital status: Single    Spouse name: Not on file   Number of children: 0   Years of education: Not on file   Highest education level: Not on file  Occupational History   Occupation: Runner, broadcasting/film/video   Tobacco Use   Smoking status: Never   Smokeless tobacco: Never  Vaping Use   Vaping Use: Never used  Substance and Sexual Activity   Alcohol use: Yes    Comment: occassionally    Drug use: No   Sexual activity: Not on file  Other Topics Concern   Not on file  Social History Narrative   Single   Orig from [facility] of [facility]  One dog      IB program.    [place]   [employer] fall 15   Girls scouts    Education major, spanish minor    [employer]  4th grade    Social Determinants of Corporate investment banker Strain: Not on file  Food Insecurity: Not on file  Transportation Needs: Not on file  Physical Activity: Not on file  Stress: Not on file  Social Connections: Not on file    Outpatient Medications Prior to Visit  Medication Sig Dispense Refill   albuterol (PROAIR HFA) 108 (90 Base) MCG/ACT inhaler Inhale 2 puffs into the lungs every 6 (six) hours as needed for wheezing. Needs follow up for refills. [phone] 20.1 g 0   betamethasone dipropionate 0.05 % lotion  SMARTSIG:Sparingly Topical     fluticasone (FLONASE) 50 MCG/ACT nasal spray 2 spray each nostril qd 16 g 3   fluticasone furoate-vilanterol (BREO ELLIPTA) 100-25 MCG/INH AEPB Inhale 1 puff into the lungs daily. For asthma 1 each 6   No facility-administered medications prior to visit.     EXAM:  BP 118/70 (BP Location: Left Arm, Patient Position: Sitting, Cuff Size: Normal)   Pulse 85   Temp (!) 95.5 F (35.3 C) (Oral)   Ht 5\' 2"  (1.575 m)   Wt 174 lb 3.2 oz (79 kg)   LMP 07/26/2020 (Exact Date)   SpO2 97%   BMI 31.86 kg/m   Body mass index is 31.86 kg/m.  GENERAL: vitals reviewed and listed above, alert, oriented, appears well hydrated and in no acute distress HEENT: atraumatic, conjunctiva  clear, no obvious abnormalities on inspection of external nose and ears sound slightly congested.  And looks allergic OP : masked  NECK: no obvious masses on inspection palpation  LUNGS: clear to  auscultation bilaterally, no wheezes, rales or rhonchi, seemingly okay air movement. CV: HRRR, no clubbing cyanosis or  peripheral edema nl cap refill  MS: moves all extremities without noticeable focal  abnormality PSYCH: pleasant and cooperative, no obvious depression or anxiety Lab Results  Component Value Date   WBC 11.0 (H) 12/02/2019   HGB 10.4 (L) 12/02/2019   HCT 33.1 (L) 12/02/2019   PLT 295 12/02/2019   GLUCOSE 89 09/02/2019   CHOL 175 09/02/2019   TRIG 67 09/02/2019   HDL 54 09/02/2019   LDLCALC 106 (H) 09/02/2019   ALT 7 09/02/2019   AST 11 09/02/2019   NA 138 09/02/2019   K 4.0 09/02/2019   CL 105 09/02/2019   CREATININE 0.50 09/02/2019   BUN 11 09/02/2019   CO2 23 09/02/2019   TSH 1.89 09/02/2019   HGBA1C 5.8 (H) 09/02/2019   BP Readings from Last 3 Encounters:  08/08/20 118/70  08/06/20 128/78  05/16/20 96/62   ED visit reviewed ASSESSMENT AND PLAN:  Discussed the following assessment and plan:  Intermittent asthma with acute exacerbation, unspecified asthma severity  Seasonal allergic rhinitis, unspecified trigger Recent flare sounds like allergic trigger but unknown.  Consider allergy evaluation at some point to assess triggers discussed troller medications use Flonase antihistamine and her daily Breo for the next month to suppress prescription given for prednisone 5 days in case there is a flare despite this when she is traveling.  And follow-up if not controlled by current regimen. Record review evaluation plan 32 minutes -Patient advised to return or notify health care team  if  new concerns arise.  Patient Instructions  Stay on breo every day .for at least a month  Flonase and antihistamine also   Albuterol as needed.  If getting a flare  of asthma despite above can take  5 days of prednisone.   Seek care   if asthma flare. Not controlled.    Consider allergy assessment to define triggers .      02-05-1970. Keishon Chavarin M.D.

## 2020-08-08 ENCOUNTER — Ambulatory Visit: Payer: BC Managed Care – PPO | Admitting: Internal Medicine

## 2020-08-08 ENCOUNTER — Encounter: Payer: Self-pay | Admitting: Internal Medicine

## 2020-08-08 ENCOUNTER — Ambulatory Visit: Payer: BC Managed Care – PPO | Attending: Internal Medicine

## 2020-08-08 VITALS — BP 118/70 | HR 85 | Temp 95.5°F | Ht 62.0 in | Wt 174.2 lb

## 2020-08-08 DIAGNOSIS — J302 Other seasonal allergic rhinitis: Secondary | ICD-10-CM | POA: Diagnosis not present

## 2020-08-08 DIAGNOSIS — J4521 Mild intermittent asthma with (acute) exacerbation: Secondary | ICD-10-CM

## 2020-08-08 DIAGNOSIS — Z20822 Contact with and (suspected) exposure to covid-19: Secondary | ICD-10-CM

## 2020-08-08 MED ORDER — PREDNISONE 20 MG PO TABS: 20.0000 mg | ORAL_TABLET | Freq: Two times a day (BID) | ORAL | 0 refills | Status: DC

## 2020-08-08 NOTE — Patient Instructions (Signed)
Stay on breo every day .for at least a month  Flonase and antihistamine also   Albuterol as needed.  If getting a flare  of asthma despite above can take  5 days of prednisone.   Seek care   if asthma flare. Not controlled.    Consider allergy assessment to define triggers .

## 2020-08-09 LAB — NOVEL CORONAVIRUS, NAA: SARS-CoV-2, NAA: NOT DETECTED

## 2020-08-09 LAB — SPECIMEN STATUS REPORT

## 2020-08-09 LAB — SARS-COV-2, NAA 2 DAY TAT

## 2020-09-12 ENCOUNTER — Other Ambulatory Visit: Payer: Self-pay

## 2020-09-12 ENCOUNTER — Ambulatory Visit: Payer: BC Managed Care – PPO | Admitting: Family Medicine

## 2020-09-12 ENCOUNTER — Encounter: Payer: Self-pay | Admitting: Family Medicine

## 2020-09-12 VITALS — BP 118/76 | HR 88 | Temp 97.9°F | Wt 165.0 lb

## 2020-09-12 DIAGNOSIS — J40 Bronchitis, not specified as acute or chronic: Secondary | ICD-10-CM | POA: Diagnosis not present

## 2020-09-12 MED ORDER — AZITHROMYCIN 250 MG PO TABS
ORAL_TABLET | ORAL | 0 refills | Status: DC
Start: 2020-09-12 — End: 2020-10-31

## 2020-09-12 NOTE — Progress Notes (Signed)
   Subjective:    Patient ID: Brittany Simon, female    DOB: 03-Jul-1995, 25 y.o.   MRN: [id number]  HPI Here for a cough that started one week ago. She spent the weekend with a few friends 10 days ago and one week ago they all developed a cough. All of them (including Brittany Simon) have tested negative for the Covid-19 virus. Her cough produces yellow sputum. She had a fever for the first day but none since then. She is not SOB, but she has inhalers if she needs them. She is drinking fluids. She also complains of a sharp pain in the right upper back when she coughs.    Review of Systems  Constitutional: Negative.   HENT: Negative.    Eyes: Negative.   Respiratory:  Positive for cough. Negative for shortness of breath and wheezing.   Cardiovascular:  Positive for chest pain. Negative for palpitations and leg swelling.      Objective:   Physical Exam Constitutional:      Appearance: Normal appearance. She is not ill-appearing.  Cardiovascular:     Rate and Rhythm: Normal rate and regular rhythm.     Pulses: Normal pulses.     Heart sounds: Normal heart sounds.  Pulmonary:     Effort: Pulmonary effort is normal.     Breath sounds: Normal breath sounds.     Comments: She is tender in the right upper back just below the scapula  Neurological:     Mental Status: She is alert.          Assessment & Plan:  She has a bronchitis, and we will treat this with a Zpack. She can use Robitussin for the cough. The back pain is from a muscle strain from coughing ,and she can use heat and Ibuprofen as needed.  Gershon Crane, MD

## 2020-10-08 ENCOUNTER — Emergency Department (HOSPITAL_BASED_OUTPATIENT_CLINIC_OR_DEPARTMENT_OTHER)
Admission: EM | Admit: 2020-10-08 | Discharge: 2020-10-08 | Disposition: A | Discharge status: Home / Self Care | Payer: BC Managed Care – PPO | Attending: Emergency Medicine | Admitting: Emergency Medicine

## 2020-10-08 ENCOUNTER — Other Ambulatory Visit: Payer: Self-pay

## 2020-10-08 ENCOUNTER — Encounter (HOSPITAL_BASED_OUTPATIENT_CLINIC_OR_DEPARTMENT_OTHER): Payer: Self-pay | Admitting: Emergency Medicine

## 2020-10-08 DIAGNOSIS — U071 COVID-19: Secondary | ICD-10-CM | POA: Diagnosis not present

## 2020-10-08 DIAGNOSIS — J45909 Unspecified asthma, uncomplicated: Secondary | ICD-10-CM | POA: Diagnosis not present

## 2020-10-08 DIAGNOSIS — R059 Cough, unspecified: Secondary | ICD-10-CM | POA: Diagnosis present

## 2020-10-08 LAB — RESP PANEL BY RT-PCR (FLU A&B, COVID) ARPGX2
Influenza A by PCR: NEGATIVE
Influenza B by PCR: NEGATIVE
SARS Coronavirus 2 by RT PCR: POSITIVE — AB

## 2020-10-08 NOTE — ED Triage Notes (Signed)
Pt arrives pov with c/o productive cough, congestion x 1 week, fever with chills x 2 days. Pt endorses theraflu at 1130 today

## 2020-10-08 NOTE — Discharge Instructions (Addendum)
I have attached information on how to best manage your COVID-19 symptoms.  I would recommend staying hydrated and drinking at least 64 ounces of water per day.  You will need to continue to quarantine based on current CDC guidelines.  If you develop any new or worsening symptoms please come back to the emergency department.  It was a pleasure to meet you.

## 2020-10-08 NOTE — ED Provider Notes (Signed)
[facility] EMERGENCY DEPT Provider Note   CSN: [id number] Arrival date & time: 10/08/20  1403     History Chief Complaint  Patient presents with   Cough    Brittany Simon is a 25 y.o. female.  HPI Patient is a 25 year old female who presents to the emergency department due to cough.  Patient states her symptoms started about 1 week ago.  She reports associated congestion, rhinorrhea.  She states yesterday she woke up from a nap and began experiencing body aches, diaphoresis, as well as chills.  She took TheraFlu today around 11:30 AM.  States she has been vaccinated for COVID-19 x3 and denies any known history of previous COVID-19 infections.  Denies any nausea, vomiting, diarrhea, chest pain, shortness of breath, abdominal pain.    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 No pertinent surgical history.   OB History   No obstetric history on file.     Family History  Problem Relation Age of Onset   Colon polyps Mother    Asthma Father    Colon cancer Neg Hx    Esophageal cancer Neg Hx    Rectal cancer Neg Hx    Stomach cancer Neg Hx     Social History   Tobacco Use   Smoking status: Never   Smokeless tobacco: Never  Vaping Use   Vaping Use: Never used  Substance Use Topics   Alcohol use: Yes    Comment: occassionally    Drug use: No    Home Medications Prior to Admission medications   Medication Sig Start Date End Date Taking? Authorizing Provider   albuterol (PROAIR HFA) 108 (90 Base) MCG/ACT inhaler Inhale 2 puffs into the lungs every 6 (six) hours as needed for wheezing. Needs follow up for refills. [phone] 03/27/20   Madelin Headings, MD  azithromycin (ZITHROMAX Z-PAK) 250 MG tablet As directed 09/12/20   Nelwyn Salisbury, MD  betamethasone dipropionate 0.05 % lotion SMARTSIG:Sparingly Topical 05/16/19   [provider]  fluticasone (FLONASE) 50 MCG/ACT nasal spray 2 spray each nostril qd 05/25/12   Panosh, Neta Mends, MD  fluticasone furoate-vilanterol (BREO ELLIPTA) 100-25 MCG/INH AEPB Inhale 1 puff into the lungs daily. For asthma 05/16/20   Panosh, Neta Mends, MD    Allergies    Patient has no known allergies.  Review of Systems   Review of Systems  Constitutional:  Positive for appetite change, chills, fatigue and fever.  HENT:  Positive for congestion and rhinorrhea. Negative for sore throat and trouble swallowing.   Respiratory:  Positive for cough. Negative for shortness of breath.   Cardiovascular:  Negative for chest pain.  Gastrointestinal:  Negative for abdominal pain, diarrhea, nausea and vomiting.   Physical Exam Updated Vital Signs BP 138/87 (BP Location: Right Arm)   Pulse (!) 105   Temp 98.8 F (37.1 C)   Resp 18   Ht 5\' 2"  (1.575 m)   Wt 72.6 kg  LMP 09/25/2020   SpO2 97%   BMI 29.26 kg/m   Physical Exam Vitals and nursing note reviewed.  Constitutional:      General: She is not in acute distress.    Appearance: Normal appearance. She is not ill-appearing, toxic-appearing or diaphoretic.  HENT:     Head: Normocephalic and atraumatic.     Right Ear: External ear normal.     Left Ear: External ear normal.     Nose: Nose normal.     Mouth/Throat:     Mouth: Mucous membranes are moist.     Pharynx: Oropharynx is clear. No oropharyngeal exudate or posterior oropharyngeal erythema.     Comments: Uvula midline.  No significant erythema noted in the posterior oropharynx.  Readily handling  secretions. Eyes:     General: No scleral icterus.       Right eye: No discharge.        Left eye: No discharge.     Extraocular Movements: Extraocular movements intact.     Conjunctiva/sclera: Conjunctivae normal.  Cardiovascular:     Rate and Rhythm: Normal rate and regular rhythm.     Pulses: Normal pulses.     Heart sounds: Normal heart sounds. No murmur heard.   No friction rub. No gallop.  Pulmonary:     Effort: Pulmonary effort is normal. No respiratory distress.     Breath sounds: No stridor. Wheezing present. No rhonchi or rales.     Comments: Faint expiratory wheezes noted in the right lower lobe. Abdominal:     General: Abdomen is flat.     Tenderness: There is no abdominal tenderness.  Musculoskeletal:        General: Normal range of motion.     Cervical back: Normal range of motion and neck supple. No tenderness.  Skin:    General: Skin is warm and dry.  Neurological:     General: No focal deficit present.     Mental Status: She is alert and oriented to person, place, and time.  Psychiatric:        Mood and Affect: Mood normal.        Behavior: Behavior normal.   ED Results / Procedures / Treatments   Labs (all labs ordered are listed, but only abnormal results are displayed) Labs Reviewed  RESP PANEL BY RT-PCR (FLU A&B, COVID) ARPGX2 - Abnormal; Notable for the following components:      Result Value   SARS Coronavirus 2 by RT PCR POSITIVE (*)    All other components within normal limits   EKG None  Radiology No results found.  Procedures Procedures   Medications Ordered in ED Medications - No data to display  ED Course  I have reviewed the triage vital signs and the nursing notes.  Pertinent labs & imaging results that were available during my care of the patient were reviewed by me and considered in my medical decision making (see chart for details).  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  Symptoms started about 1 week ago.  She has been vaccinated x3 and denies any known history of previous COVID-19 infections.  Physical exam is reassuring.  Patient had mild expiratory wheezing in the right lower lobe but otherwise her lungs are clear to auscultation.  Heart is regular rate and rhythm without murmurs, rubs, or gallops.  Patient denies any chest pain, shortness of breath, abdominal pain, nausea, vomiting, or diarrhea.  Patient understands she will need to continue to quarantine based on current CDC guidelines.  Discussed adequate hydration.  Discussed return precautions.  Feel that she is stable for discharge at this time and she is agreeable.  Her questions were answered and she was amicable at the time of discharge.  Final Clinical Impression(s) / ED Diagnoses Final diagnoses:  COVID-19   Rx / DC Orders ED Discharge Orders     None        Placido Sou, PA-C 10/08/20 1544    Tanda Rockers A, DO 10/09/20 1422

## 2020-10-08 NOTE — ED Notes (Signed)
Present with respatory symptoms.  States that she thinks she has COVID and wanting to be tested

## 2020-10-30 ENCOUNTER — Other Ambulatory Visit: Payer: Self-pay

## 2020-10-31 ENCOUNTER — Ambulatory Visit: Payer: BC Managed Care – PPO | Admitting: Family Medicine

## 2020-10-31 ENCOUNTER — Encounter: Payer: Self-pay | Admitting: Family Medicine

## 2020-10-31 VITALS — BP 120/78 | HR 133 | Temp 97.9°F | Wt 159.0 lb

## 2020-10-31 DIAGNOSIS — S63621A Sprain of interphalangeal joint of right thumb, initial encounter: Secondary | ICD-10-CM | POA: Diagnosis not present

## 2020-10-31 DIAGNOSIS — J301 Allergic rhinitis due to pollen: Secondary | ICD-10-CM | POA: Diagnosis not present

## 2020-10-31 DIAGNOSIS — J4521 Mild intermittent asthma with (acute) exacerbation: Secondary | ICD-10-CM

## 2020-10-31 MED ORDER — FEXOFENADINE HCL 180 MG PO TABS: 180.0000 mg | ORAL_TABLET | Freq: Every day | ORAL | 0 refills | Status: DC

## 2020-10-31 MED ORDER — FLUTICASONE PROPIONATE 50 MCG/ACT NA SUSP: NASAL | 3 refills | Status: DC

## 2020-10-31 MED ORDER — MONTELUKAST SODIUM 10 MG PO TABS: 10.0000 mg | ORAL_TABLET | Freq: Every day | ORAL | 11 refills | Status: DC

## 2020-10-31 NOTE — Progress Notes (Signed)
   Subjective:    Patient ID: Brittany Simon, female    DOB: [birth year], 25 y.o.   MRN: [id number]  HPI Here to follow up a Covid-19 infection and some other issues. About 4 weeks ago she developed a dry cough, body aches, headache, and loss of taste and smell. She was seen in the ED on 10-08-20 and she tested positive for the Covid virus. She was treated with OTC remedies, and she recovered quickly. However over the past week she has had stuffy head, runny nose, PND, a a dry cough. No fever or body aches or NVD. She is drinking fluids, using her Breo daily, and using her albuterol inhaler as needed. She always has allergy flare ups in the spring but not in the fall, but this time it feels like her allergies again. In addition she has had swelling and mild pain in the right thumb the past 2 days. She does not remember any trauma.    Review of Systems  Constitutional: Negative.   HENT:  Positive for congestion, postnasal drip, rhinorrhea and sneezing. Negative for ear pain, sinus pain and sore throat.   Eyes:  Positive for itching. Negative for discharge and redness.  Respiratory:  Positive for cough and wheezing. Negative for shortness of breath.   Cardiovascular: Negative.   Musculoskeletal:  Positive for arthralgias.      Objective:   Physical Exam Constitutional:      Appearance: Normal appearance.  HENT:     Right Ear: Tympanic membrane, ear canal and external ear normal.     Left Ear: Tympanic membrane, ear canal and external ear normal.     Nose: Nose normal.     Mouth/Throat:     Pharynx: Oropharynx is clear.  Eyes:     Conjunctiva/sclera: Conjunctivae normal.  Cardiovascular:     Rate and Rhythm: Normal rate and regular rhythm.     Pulses: Normal pulses.     Heart sounds: Normal heart sounds.  Pulmonary:     Effort: Pulmonary effort is normal. No respiratory distress.     Breath sounds: Normal breath sounds. No rhonchi or rales.     Comments: Slight scattered wheezes   Musculoskeletal:     Comments: Right thumb has mild swelling and mild tenderness at the IP joint. ROM is full. No erythema or warmth   Lymphadenopathy:     Cervical: No cervical adenopathy.  Neurological:     Mental Status: She is alert.          Assessment & Plan:  Her Covid-19 infection has resolved. She has a current flare of her allergies and her asthma. She will use Breo daily and albuterol as needed. She will get back on a daily regimen of Flonase nasal spray, Singulair, and Allegra. Also she apparently has a mild sprain of the right thumb. She can ice this. It should heal up quickly. We spent a total of ( 33  ) minutes reviewing records and discussing these issues.  Gershon Crane, MD

## 2020-11-12 ENCOUNTER — Other Ambulatory Visit: Payer: Self-pay

## 2020-11-12 MED ORDER — ALBUTEROL SULFATE HFA 108 (90 BASE) MCG/ACT IN AERS: 2.0000 | INHALATION_SPRAY | Freq: Four times a day (QID) | RESPIRATORY_TRACT | 1 refills | Status: DC | PRN

## 2020-11-19 ENCOUNTER — Other Ambulatory Visit: Payer: Self-pay

## 2020-11-19 ENCOUNTER — Encounter: Payer: Self-pay | Admitting: Internal Medicine

## 2020-11-19 ENCOUNTER — Telehealth: Payer: BC Managed Care – PPO | Admitting: Internal Medicine

## 2020-11-19 ENCOUNTER — Ambulatory Visit (INDEPENDENT_AMBULATORY_CARE_PROVIDER_SITE_OTHER): Payer: BC Managed Care – PPO

## 2020-11-19 VITALS — BP 124/86 | Temp 98.6°F | Ht 62.0 in | Wt 160.2 lb

## 2020-11-19 DIAGNOSIS — J301 Allergic rhinitis due to pollen: Secondary | ICD-10-CM | POA: Diagnosis not present

## 2020-11-19 DIAGNOSIS — R053 Chronic cough: Secondary | ICD-10-CM

## 2020-11-19 DIAGNOSIS — Z79899 Other long term (current) drug therapy: Secondary | ICD-10-CM

## 2020-11-19 DIAGNOSIS — Z8616 Personal history of COVID-19: Secondary | ICD-10-CM

## 2020-11-19 DIAGNOSIS — J4521 Mild intermittent asthma with (acute) exacerbation: Secondary | ICD-10-CM | POA: Diagnosis not present

## 2020-11-19 MED ORDER — METHYLPREDNISOLONE ACETATE 80 MG/ML IJ SUSP
80.0000 mg | Freq: Once | INTRAMUSCULAR | Status: AC
  Administered 2020-11-19: 80 mg via INTRAMUSCULAR

## 2020-11-19 MED ORDER — METHYLPREDNISOLONE ACETATE 40 MG/ML IJ SUSP
40.0000 mg | Freq: Once | INTRAMUSCULAR | Status: AC
  Administered 2020-11-19: 40 mg via INTRAMUSCULAR

## 2020-11-19 NOTE — Patient Instructions (Addendum)
Your wheezing is  not controlled today .  Giving you a shot of  DepoMedrol today  and tomorrow begin the  prednisone we gave you before.  Ok to continue the  albuterol as needed.   I will be doing a referral to either pulmonary or allergy  for evaluation and help.   After off the prednisone  (if getting better )ok to use the breo twice a day for another 2 weeks  if helping.   If getting sings of infection such as  fever green  thick opaque persistent  phlegm  we may add antibiotic for  possible sinus infection also  but you had  antibiotic last month . Let us know if this is happening   Please  send in message about how doing  on Wednesday or Thursday . This week ( 2-3 days)    To ed if short of breath and getting worse .

## 2020-11-19 NOTE — Progress Notes (Signed)
Chief Complaint  Patient presents with   Cough    Patient complains of cough, x2 months, Tried Promethazine, Albuterol inhaler and Robitussin with little relief     HPI: Brittany Simon 25 y.o. come in for on going cough for the past 2 months. She has had an asthma flare in July But then travel to [place] in August came back with a respiratory infection and asthma flare. Then had had covid 10 September  5 mostly upper respiratory congestion After that improved over the last week or so developed more of a cough more excess mucus nasal and chest.  No hemoptysis fever does get relief with albuterol rescue once or twice a day but does not last. Thinks that her office so bad it causes her to wheeze. Has hx of  intermittent asthma w hx of some exacerbations    If she takes albuterol in the morning she is able to go through her day at school without a lot of difficulty but then gets home and things get worse. She has seasonal allergies tends to be spring animals.  Has taken environmental measures in her household does not seem to make a difference. She has been taking her Breo, singular most days, and other antiallergy regimen. She has never been evaluated by allergy asthma specialist Saw Dr Clent Ridges  9 28 22   Her Covid-19 infection has resolved. She has a current flare of her allergies and her asthma. She will use Breo daily and albuterol as needed. She will get back on a daily regimen of Flonase nasal spray, Singulair, and Allegra. Also she apparently has a mild sprain of the right thumb. She can ice this. It should heal up quickly. We spent a total of ( 33  ) minutes reviewing records and discussing these issues.  , MD ROS: See pertinent positives and negatives per HPI. Past hx  OP pna see 2018  Past Medical History:  Diagnosis Date   Allergy    Anemia 02/2018   Asthma    Prematurity, fetus 35-36 completed weeks of gestation     4 71z 36 weeks    Family History  Problem  Relation Age of Onset   Colon polyps Mother    Asthma Father    Colon cancer Neg Hx    Esophageal cancer Neg Hx    Rectal cancer Neg Hx    Stomach cancer Neg Hx     Social History   Socioeconomic History   Marital status: Single    Spouse name: Not on file   Number of children: 0   Years of education: Not on file   Highest education level: Not on file  Occupational History   Occupation: [employer]z [employer]  Tobacco Use   Smoking status: Never   Smokeless tobacco: Never  Vaping Use   Vaping Use: Never used  Substance and Sexual Activity   Alcohol use: Yes    Comment: occassionally    Drug use: No   Sexual activity: Not on file  Other Topics Concern   Not on file  Social History Narrative   Single   Orig from [facility] of 4  One dog      IB program.    [place].   [employer] fall 15   [employer]s sc[employer]    Education major, spanish minor    [employer]  4th grade    Social Determinants of Health   Financial Resource Strain: Not on [place] Insecurity: Not on  file  Transportation Needs: Not on file  Physical Activity: Not on file  Stress: Not on file  Social Connections: Not on file    Outpatient Medications Prior to Visit  Medication Sig Dispense Refill   albuterol (PROAIR HFA) 108 (90 Base) MCG/ACT inhaler Inhale 2 puffs into the lungs every 6 (six) hours as needed for wheezing. Needs follow up for refills. [phone] 20.1 g 1   betamethasone dipropionate 0.05 % lotion SMARTSIG:Sparingly Topical     fexofenadine (ALLEGRA ALLERGY) 180 MG tablet Take 1 tablet (180 mg total) by mouth daily. 1 tablet 0   fluticasone (FLONASE) 50 MCG/ACT nasal spray 2 spray each nostril qd 16 g 3   fluticasone furoate-vilanterol (BREO ELLIPTA) 100-25 MCG/INH AEPB Inhale 1 puff into the lungs daily. For asthma 1 each 6   montelukast (SINGULAIR) 10 MG tablet Take 1 tablet (10 mg total) by mouth at bedtime. 30 tablet 11   No facility-administered medications prior to visit.     EXAM:  BP  124/86 (BP Location: Left Arm, Patient Position: Sitting, Cuff Size: Normal)   Temp 98.6 F (37 C) (Oral)   Ht 5\' 2"  (1.575 m)   Wt 160 lb 3.2 oz (72.7 kg)   BMI 29.30 kg/m   Body mass index is 29.3 kg/m.  GENERAL: vitals reviewed and listed above, alert, oriented, appears well hydrated and in no acute distress has mask on looks allergic intermittent occasional bronchial cough.  TMs clear eyes clear HEENT: atraumatic, conjunctiva  clear, no obvious abnormalities on inspection of external nose and ears OP : masked upper respiratory congestion noted NECK: no obvious masses on inspection palpation  LUNGS: Decreased breath sound appears tight but no respiratory distress with an occasional wheeze.  No rales or rhonchi. CV: HRRR, no clubbing cyanosis or  peripheral edema nl cap refill artificial long nails preclude pulse ox.  Color is good MS: moves all extremities without noticeable focal  abnormality PSYCH: pleasant and cooperative, no obvious depression or anxiety  BP Readings from Last 3 Encounters:  11/19/20 124/86  10/31/20 120/78  10/08/20 138/87    ASSESSMENT AND PLAN:  Discussed the following assessment and plan:  Cough, persistent - Plan: DG Chest 2 View  Mild intermittent asthma with acute exacerbation - Plan: DG Chest 2 View, methylPREDNISolone acetate (DEPO-MEDROL) injection 40 mg, methylPREDNISolone acetate (DEPO-MEDROL) injection 80 mg  Intermittent asthma with acute exacerbation, unspecified asthma severity - Plan: methylPREDNISolone acetate (DEPO-MEDROL) injection 40 mg, methylPREDNISolone acetate (DEPO-MEDROL) injection 80 mg  Seasonal allergic rhinitis due to pollen - Plan: methylPREDNISolone acetate (DEPO-MEDROL) injection 40 mg, methylPREDNISolone acetate (DEPO-MEDROL) injection 80 mg  Medication management  Personal history of COVID-19 - Plan: DG Chest 2 View [place] respiratory insults over the last couple months including COVID at present appears to be  asthmatic uncertain triggers and she is on triple therapy per se. Steroid injection and 5-day burst and is on 20 mg take 2 a day for 5 days. In addition after off steroids to increase her controller inhaler Plan referral to allergy/pulmonary depending on x-ray finding.  Preliminary shows hyperexpansion and no acute findings. Close contact follow-up as appropriate let 12/08/20 know in 2 to 3 days her status. DIP point on adding antibiotics as she has had 1 course already under different circumstances.  If symptoms change however may add anti-infectives. -Patient advised to return or notify health care team  if  new concerns arise. Review visit plan 35 minutes Patient Instructions  Your wheezing is  not controlled  today .  Giving you a shot of  DepoMedrol today  and tomorrow begin the  prednisone we gave you before.  Ok to continue the  albuterol as needed.   I will be doing a referral to either pulmonary or allergy  for evaluation and help.   After off the prednisone  (if getting better )ok to use the breo twice a day for another 2 weeks  if helping.   If getting sings of infection such as  fever green  thick opaque persistent  phlegm  we may add antibiotic for  possible sinus infection also  but you had  antibiotic last month . Let us know if this is happening   Please  send in message about how doing  on Wednesday or Thursday . This week ( 2-3 days)    To ed if short of breath and getting worse .     Neta Mends. Hadassah Rana M.D.

## 2020-11-19 NOTE — Progress Notes (Signed)
Per orders of Dr. Fabian Sharp, injection of Methylprednisolone 120 mg given by Christia Coaxum L Porschia Willbanks. Patient tolerated injection well.

## 2020-11-23 NOTE — Progress Notes (Signed)
Normal chest x ray  reassuring

## 2021-01-09 ENCOUNTER — Observation Stay (HOSPITAL_BASED_OUTPATIENT_CLINIC_OR_DEPARTMENT_OTHER)
Admission: EM | Admit: 2021-01-09 | Discharge: 2021-01-11 | Disposition: A | Discharge status: Home / Self Care | Payer: BC Managed Care – PPO | Attending: Internal Medicine | Admitting: Internal Medicine

## 2021-01-09 ENCOUNTER — Other Ambulatory Visit: Payer: Self-pay

## 2021-01-09 ENCOUNTER — Encounter (HOSPITAL_BASED_OUTPATIENT_CLINIC_OR_DEPARTMENT_OTHER): Payer: Self-pay

## 2021-01-09 ENCOUNTER — Emergency Department (HOSPITAL_BASED_OUTPATIENT_CLINIC_OR_DEPARTMENT_OTHER): Payer: BC Managed Care – PPO

## 2021-01-09 DIAGNOSIS — J9601 Acute respiratory failure with hypoxia: Secondary | ICD-10-CM | POA: Diagnosis present

## 2021-01-09 DIAGNOSIS — J45901 Unspecified asthma with (acute) exacerbation: Secondary | ICD-10-CM | POA: Diagnosis present

## 2021-01-09 DIAGNOSIS — Z79899 Other long term (current) drug therapy: Secondary | ICD-10-CM | POA: Diagnosis not present

## 2021-01-09 DIAGNOSIS — E876 Hypokalemia: Secondary | ICD-10-CM | POA: Insufficient documentation

## 2021-01-09 DIAGNOSIS — Z20822 Contact with and (suspected) exposure to covid-19: Secondary | ICD-10-CM | POA: Insufficient documentation

## 2021-01-09 DIAGNOSIS — R0602 Shortness of breath: Secondary | ICD-10-CM | POA: Diagnosis present

## 2021-01-09 DIAGNOSIS — R55 Syncope and collapse: Secondary | ICD-10-CM | POA: Insufficient documentation

## 2021-01-09 DIAGNOSIS — J4551 Severe persistent asthma with (acute) exacerbation: Secondary | ICD-10-CM | POA: Diagnosis not present

## 2021-01-09 LAB — I-STAT VENOUS BLOOD GAS, ED
Acid-base deficit: 6 mmol/L — ABNORMAL HIGH (ref 0.0–2.0)
Bicarbonate: 22 mmol/L (ref 20.0–28.0)
Calcium, Ion: 1.19 mmol/L (ref 1.15–1.40)
HCT: 36 % (ref 36.0–46.0)
Hemoglobin: 12.2 g/dL (ref 12.0–15.0)
O2 Saturation: 52 %
Patient temperature: 98
Potassium: 3.2 mmol/L — ABNORMAL LOW (ref 3.5–5.1)
Sodium: 138 mmol/L (ref 135–145)
TCO2: 24 mmol/L (ref 22–32)
pCO2, Ven: 51.5 mmHg (ref 44.0–60.0)
pH, Ven: 7.236 — ABNORMAL LOW (ref 7.250–7.430)
pO2, Ven: 32 mmHg (ref 32.0–45.0)

## 2021-01-09 LAB — CBC WITH DIFFERENTIAL/PLATELET
Abs Immature Granulocytes: 0 10*3/uL (ref 0.00–0.07)
Basophils Absolute: 0.3 10*3/uL — ABNORMAL HIGH (ref 0.0–0.1)
Basophils Relative: 1 %
Eosinophils Absolute: 2.6 10*3/uL — ABNORMAL HIGH (ref 0.0–0.5)
Eosinophils Relative: 9 %
HCT: 38.4 % (ref 36.0–46.0)
Hemoglobin: 11.7 g/dL — ABNORMAL LOW (ref 12.0–15.0)
Lymphocytes Relative: 45 %
Lymphs Abs: 13.1 10*3/uL — ABNORMAL HIGH (ref 0.7–4.0)
MCH: 24.9 pg — ABNORMAL LOW (ref 26.0–34.0)
MCHC: 30.5 g/dL (ref 30.0–36.0)
MCV: 81.7 fL (ref 80.0–100.0)
Monocytes Absolute: 2 10*3/uL — ABNORMAL HIGH (ref 0.1–1.0)
Monocytes Relative: 7 %
Neutro Abs: 11.1 10*3/uL — ABNORMAL HIGH (ref 1.7–7.7)
Neutrophils Relative %: 38 %
Platelets: 400 10*3/uL (ref 150–400)
RBC: 4.7 MIL/uL (ref 3.87–5.11)
RDW: 15.3 % (ref 11.5–15.5)
WBC Morphology: ABNORMAL
WBC: 29.1 10*3/uL — ABNORMAL HIGH (ref 4.0–10.5)
nRBC: 0 % (ref 0.0–0.2)

## 2021-01-09 LAB — BASIC METABOLIC PANEL
Anion gap: 16 — ABNORMAL HIGH (ref 5–15)
BUN: 9 mg/dL (ref 6–20)
CO2: 19 mmol/L — ABNORMAL LOW (ref 22–32)
Calcium: 10.2 mg/dL (ref 8.9–10.3)
Chloride: 104 mmol/L (ref 98–111)
Creatinine, Ser: 0.69 mg/dL (ref 0.44–1.00)
GFR, Estimated: >60 mL/min (ref >60)
Glucose, Bld: 221 mg/dL — ABNORMAL HIGH (ref 70–99)
Potassium: 3.9 mmol/L (ref 3.5–5.1)
Sodium: 139 mmol/L (ref 135–145)

## 2021-01-09 LAB — RESP PANEL BY RT-PCR (FLU A&B, COVID) ARPGX2
Influenza A by PCR: NEGATIVE
Influenza B by PCR: NEGATIVE
SARS Coronavirus 2 by RT PCR: NEGATIVE

## 2021-01-09 MED ORDER — IPRATROPIUM BROMIDE 0.02 % IN SOLN
0.5000 mg | Freq: Once | RESPIRATORY_TRACT | Status: AC
  Administered 2021-01-09: 1 mg via RESPIRATORY_TRACT
  Filled 2021-01-09: qty 2.5

## 2021-01-09 MED ORDER — ALBUTEROL SULFATE (2.5 MG/3ML) 0.083% IN NEBU
5.0000 mg | INHALATION_SOLUTION | Freq: Once | RESPIRATORY_TRACT | Status: AC
  Administered 2021-01-09: 5 mg via RESPIRATORY_TRACT
  Filled 2021-01-09: qty 6

## 2021-01-09 MED ORDER — LACTATED RINGERS IV BOLUS
1000.0000 mL | Freq: Once | INTRAVENOUS | Status: AC
  Administered 2021-01-09: 1000 mL via INTRAVENOUS

## 2021-01-09 MED ORDER — MAGNESIUM SULFATE 2 GM/50ML IV SOLN
2.0000 g | Freq: Once | INTRAVENOUS | Status: AC
  Administered 2021-01-09: 2 g via INTRAVENOUS
  Filled 2021-01-09: qty 50

## 2021-01-09 MED ORDER — LORAZEPAM 2 MG/ML IJ SOLN
0.5000 mg | Freq: Once | INTRAMUSCULAR | Status: AC
  Administered 2021-01-09: 0.5 mg via INTRAVENOUS
  Filled 2021-01-09: qty 1

## 2021-01-09 MED ORDER — ALBUTEROL (5 MG/ML) CONTINUOUS INHALATION SOLN
10.0000 mg/h | INHALATION_SOLUTION | Freq: Once | RESPIRATORY_TRACT | Status: AC
  Administered 2021-01-09: 10 mg/h via RESPIRATORY_TRACT
  Filled 2021-01-09: qty 20

## 2021-01-09 MED ORDER — METHYLPREDNISOLONE SODIUM SUCC 125 MG IJ SOLR
125.0000 mg | Freq: Once | INTRAMUSCULAR | Status: AC
  Administered 2021-01-09: 125 mg via INTRAVENOUS
  Filled 2021-01-09: qty 2

## 2021-01-09 MED ORDER — IPRATROPIUM BROMIDE 0.02 % IN SOLN
RESPIRATORY_TRACT | Status: AC
  Administered 2021-01-09: 0.5 mg via RESPIRATORY_TRACT
  Filled 2021-01-09: qty 2.5

## 2021-01-09 MED ORDER — PREDNISONE 50 MG PO TABS: 60.0000 mg | ORAL_TABLET | Freq: Once | ORAL | Status: DC

## 2021-01-09 MED ORDER — ALBUTEROL (5 MG/ML) CONTINUOUS INHALATION SOLN
15.0000 mg/h | INHALATION_SOLUTION | Freq: Once | RESPIRATORY_TRACT | Status: AC
  Administered 2021-01-09: 15 mg/h via RESPIRATORY_TRACT

## 2021-01-09 NOTE — ED Notes (Signed)
This RN, Kenney Houseman, EMT, and Gavin Pound, NT assisted in providing Perineal Care to this Patient. Clean Bed Sheets provided and Patient clean and comfortable at this Time. Call Bell within Reach.

## 2021-01-09 NOTE — ED Provider Notes (Addendum)
[facility] EMERGENCY DEPT Provider Note   CSN: [id number] Arrival date & time: 01/09/21  1956     History Chief Complaint  Patient presents with   Shortness of Breath    Brittany Simon is a 25 y.o. female.  Patient is a 25 year old female with a history of anemia and asthma who is presenting today with complaint of being short of breath.  Patient reports that she has been wheezing frequently over the last few days but today she started to develop much worsening shortness of breath.  She had been using albuterol every 4 hours but reports it has not been working.  She has had a nonproductive cough and denies any chest pain, fever or URI symptoms.  Upon arrival to the emergency room patient is frantic reporting she cannot breathe.  She was taken back to her room and the patient syncopized.  She denies any tobacco use.  She has been having normal menses.  No history of blood clots.  The history is provided by the patient and medical records.  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 No pertinent surgical history.   OB History   No obstetric history on file.     Family History  Problem Relation Age of Onset   Colon polyps Mother    Asthma Father    Colon cancer Neg Hx    Esophageal cancer Neg Hx    Rectal cancer Neg Hx    Stomach cancer Neg Hx     Social History   Tobacco Use   Smoking status: Never   Smokeless tobacco: Never  Vaping Use   Vaping Use: Never used  Substance Use Topics    Alcohol use: Yes    Comment: occassionally    Drug use: No    Home Medications Prior to Admission medications   Medication Sig Start Date End Date Taking? Authorizing Provider  albuterol (PROAIR HFA) 108 (90 Base) MCG/ACT inhaler Inhale 2 puffs into the lungs every 6 (six) hours as needed for wheezing. Needs follow up for refills. [phone] 11/12/20   Madelin Headings, MD  betamethasone dipropionate 0.05 % lotion SMARTSIG:Sparingly Topical 05/16/19   [provider]  fexofenadine (ALLEGRA ALLERGY) 180 MG tablet Take 1 tablet (180 mg total) by mouth daily. 10/31/20   Nelwyn Salisbury, MD  fluticasone Aleda Grana) 50 MCG/ACT nasal spray 2 spray each nostril qd 10/31/20   Nelwyn Salisbury, MD  fluticasone furoate-vilanterol (BREO ELLIPTA) 100-25 MCG/INH AEPB Inhale 1 puff into the lungs daily. For asthma 05/16/20   Panosh, Neta Mends, MD  montelukast (SINGULAIR) 10 MG tablet Take 1 tablet (10 mg total) by mouth at bedtime. 10/31/20   Nelwyn Salisbury, MD    Allergies    Patient has no known allergies.  Review of Systems   Review of Systems  Respiratory:  Positive for shortness of breath.   All other systems reviewed and are negative.  Physical Exam Updated Vital Signs  BP (!) 144/87 (BP Location: Right Arm)   Pulse (!) 108   Resp (!) 22   Ht  (1.575 m)   Wt 72.7 kg   SpO2 100%   BMI 29.31 kg/m   Physical Exam Vitals and nursing note reviewed.  Constitutional:      General: She is in acute distress.     Appearance: She is well-developed. She is diaphoretic.     Comments: Appears frantic  HENT:     Head: Normocephalic and atraumatic.  Eyes:     Pupils: Pupils are equal, round, and reactive to light.  Cardiovascular:     Rate and Rhythm: Regular rhythm. Tachycardia present.     Heart sounds: Normal heart sounds. No murmur heard.   No friction rub.  Pulmonary:     Effort: Pulmonary effort is normal. Tachypnea present.     Breath sounds: Decreased breath sounds and  wheezing present. No rales.  Abdominal:     General: Bowel sounds are normal. There is no distension.     Palpations: Abdomen is soft.     Tenderness: There is no abdominal tenderness. There is no guarding or rebound.  Musculoskeletal:        General: No tenderness. Normal range of motion.     Right lower leg: No edema.     Left lower leg: No edema.     Comments: No edema  Skin:    General: Skin is warm.     Findings: No rash.  Neurological:     Mental Status: She is alert and oriented to person, place, and time. Mental status is at baseline.     Cranial Nerves: No cranial nerve deficit.  Psychiatric:        Mood and Affect: Mood normal.        Behavior: Behavior normal.    ED Results / Procedures / Treatments   Labs (all labs ordered are listed, but only abnormal results are displayed) Labs Reviewed  CBC WITH DIFFERENTIAL/PLATELET - Abnormal; Notable for the following components:      Result Value   WBC 29.1 (*)    Hemoglobin 11.7 (*)    MCH 24.9 (*)    Neutro Abs 11.1 (*)    Lymphs Abs 13.1 (*)    Monocytes Absolute 2.0 (*)    Eosinophils Absolute 2.6 (*)    Basophils Absolute 0.3 (*)    All other components within normal limits  BASIC METABOLIC PANEL - Abnormal; Notable for the following components:   CO2 19 (*)    Glucose, Bld 221 (*)    Anion gap 16 (*)    All other components within normal limits  I-STAT VENOUS BLOOD GAS, ED - Abnormal; Notable for the following components:   pH, Ven 7.236 (*)    Acid-base deficit 6.0 (*)    Potassium 3.2 (*)    All other components within normal limits  RESP PANEL BY RT-PCR (FLU A&B, COVID) ARPGX2  PREGNANCY, URINE  PATHOLOGIST SMEAR REVIEW    EKG EKG Interpretation  Date/Time:  Wednesday January 09 2021 20:14:35 EST Ventricular Rate:  118 PR Interval:  164 QRS Duration: 86 QT Interval:  302 QTC Calculation: 424 R Axis:   57 Text Interpretation: Sinus tachycardia Baseline wander in lead(s) V6 No previous tracing  Confirmed by Gwyneth Sprout ([id number]) on 01/09/2021 8:32:25 PM  Radiology DG Chest Port 1 View  Result Date: 01/09/2021 CLINICAL DATA:  Dyspnea EXAM: PORTABLE CHEST 1 VIEW COMPARISON:  11/19/2020 FINDINGS: The heart  size and mediastinal contours are within normal limits. Both lungs are clear. Nodular density at the right costophrenic angle likely represents a nipple shadow. The visualized skeletal structures are unremarkable. IMPRESSION: No active disease. Electronically Signed   By: Helyn Numbers M.D.   On: 01/09/2021 21:42    Procedures Procedures   Medications Ordered in ED Medications  magnesium sulfate IVPB 2 g 50 mL (2 g Intravenous New Bag/Given 01/09/21 2021)  ipratropium (ATROVENT) nebulizer solution 0.5 mg (has no administration in time range)  ipratropium (ATROVENT) 0.02 % nebulizer solution (has no administration in time range)  albuterol (PROVENTIL,VENTOLIN) solution continuous neb (has no administration in time range)  LORazepam (ATIVAN) injection 0.5 mg (has no administration in time range)  albuterol (PROVENTIL) (2.5 MG/3ML) 0.083% nebulizer solution 5 mg (5 mg Nebulization Given 01/09/21 2017)  methylPREDNISolone sodium succinate (SOLU-MEDROL) 125 mg/2 mL injection 125 mg (125 mg Intravenous Given 01/09/21 2018)  lactated ringers bolus 1,000 mL (1,000 mLs Intravenous New Bag/Given 01/09/21 2023)    ED Course  I have reviewed the triage vital signs and the nursing notes.  Pertinent labs & imaging results that were available during my care of the patient were reviewed by me and considered in my medical decision making (see chart for details).    MDM Rules/Calculators/A&P                           Patient is a 25 year old female presenting today with what appears to be an acute asthma exacerbation.  She denies any infectious symptoms but upon arrival here is frantic and repeatedly saying she cannot breathe.  Patient's oxygen saturation is 100% however she does have wheezing  and decreased breath sounds.  Upon taking her immediately to a room patient has a syncopal event.  Heart rate remains elevated and oxygen drops for a brief time but then returns to 100%.  Patient is now diffusely wheezing.  She was given albuterol initially but wheezing persists.  She was started on a CAT with 15 mg of albuterol, Atrovent and given Solu-Medrol and magnesium.  COVID, lab work and chest x-ray are pending.  Patient offered something for anxiety as she does appear very anxious.  She initially declined and then accepted.  Patient's father is also at bedside and discussed with him what was going on.  9:59 PM Patient has a leukocytosis of 29,000 which might be reactive, hemoglobin normal, patient is afebrile and again COVID and flu are negative.  Chest x-ray is within normal limits.  BMP with elevated blood sugar but otherwise normal.  VBG is pending.  After hour-long continuous neb patient is still wheezing and still having tachypnea but improved from before.  Oxygen saturation still 100%.  We will plan on admitting for further care of asthma exacerbation.  We will continually reevaluate to ensure patient does not need BiPAP.  Findings discussed with her family member who is in the room.  10:55 PM Patient's VBG showed a pH of 7.23 and a CO2 of 51.  Patient due to her work of breathing and decreased pH we will start her on BiPAP.  She still has diffuse wheezing but on BiPAP is breathing much more comfortably.  MDM   Amount and/or Complexity of Data Reviewed Clinical lab tests: ordered and reviewed Tests in the radiology section of CPT: ordered and reviewed Tests in the medicine section of CPT: ordered and reviewed Decide to obtain previous medical records or to obtain history  from someone other than the patient: yes Obtain history from someone other than the patient: yes Review and summarize past medical records: yes Discuss the patient with other providers: yes Independent  visualization of images, tracings, or specimens: yes  Risk of Complications, Morbidity, and/or Mortality Presenting problems: high Diagnostic procedures: high Management options: high    CRITICAL CARE Performed by: Freedom Lopezperez Total critical care time: 30 minutes Critical care time was exclusive of separately billable procedures and treating other patients. Critical care was necessary to treat or prevent imminent or life-threatening deterioration. Critical care was time spent personally by me on the following activities: development of treatment plan with patient and/or surrogate as well as nursing, discussions with consultants, evaluation of patient's response to treatment, examination of patient, obtaining history from patient or surrogate, ordering and performing treatments and interventions, ordering and review of laboratory studies, ordering and review of radiographic studies, pulse oximetry and re-evaluation of patient's condition.   Final Clinical Impression(s) / ED Diagnoses Final diagnoses:  Severe persistent asthma with exacerbation    Rx / DC Orders ED Discharge Orders     None        Gwyneth Sprout, MD 01/09/21 2200    Gwyneth Sprout, MD 01/09/21 2256

## 2021-01-09 NOTE — ED Triage Notes (Signed)
Patient BIB Friend for SOB.  Patient has been having Wheezing for several days but shortly PTA she had a Sudden Increase in SOB.  In Waiting Area, Patient had Syncopal Episode in the Wheelchair.  Patient Brought into Examination Room. RT and MD at Bedside.

## 2021-01-09 NOTE — Progress Notes (Signed)
Placed patient on Servo U  Non-Invasive. RR set 10  PEEP of 5.  PC over Peep 9.

## 2021-01-10 ENCOUNTER — Observation Stay (HOSPITAL_COMMUNITY)
Admit: 2021-01-10 | Discharge: 2021-01-10 | Disposition: A | Discharge status: Home / Self Care | Payer: BC Managed Care – PPO | Attending: Internal Medicine | Admitting: Internal Medicine

## 2021-01-10 DIAGNOSIS — Z20822 Contact with and (suspected) exposure to covid-19: Secondary | ICD-10-CM | POA: Diagnosis not present

## 2021-01-10 DIAGNOSIS — R55 Syncope and collapse: Secondary | ICD-10-CM | POA: Diagnosis not present

## 2021-01-10 DIAGNOSIS — J45901 Unspecified asthma with (acute) exacerbation: Secondary | ICD-10-CM

## 2021-01-10 DIAGNOSIS — R0602 Shortness of breath: Secondary | ICD-10-CM | POA: Diagnosis present

## 2021-01-10 DIAGNOSIS — J9601 Acute respiratory failure with hypoxia: Secondary | ICD-10-CM | POA: Diagnosis not present

## 2021-01-10 DIAGNOSIS — Z79899 Other long term (current) drug therapy: Secondary | ICD-10-CM | POA: Diagnosis not present

## 2021-01-10 DIAGNOSIS — E876 Hypokalemia: Secondary | ICD-10-CM | POA: Diagnosis not present

## 2021-01-10 DIAGNOSIS — J4551 Severe persistent asthma with (acute) exacerbation: Secondary | ICD-10-CM | POA: Diagnosis not present

## 2021-01-10 HISTORY — DX: Acute respiratory failure with hypoxia: J96.01

## 2021-01-10 LAB — COMPREHENSIVE METABOLIC PANEL
ALT: 21 U/L (ref 0–44)
AST: 24 U/L (ref 15–41)
Albumin: 3.9 g/dL (ref 3.5–5.0)
Alkaline Phosphatase: 55 U/L (ref 38–126)
Anion gap: 8 (ref 5–15)
BUN: 7 mg/dL (ref 6–20)
CO2: 21 mmol/L — ABNORMAL LOW (ref 22–32)
Calcium: 9.3 mg/dL (ref 8.9–10.3)
Chloride: 111 mmol/L (ref 98–111)
Creatinine, Ser: 0.42 mg/dL — ABNORMAL LOW (ref 0.44–1.00)
GFR, Estimated: >60 mL/min (ref >60)
Glucose, Bld: 93 mg/dL (ref 70–99)
Potassium: 3.6 mmol/L (ref 3.5–5.1)
Sodium: 140 mmol/L (ref 135–145)
Total Bilirubin: 0.3 mg/dL (ref 0.3–1.2)
Total Protein: 7.4 g/dL (ref 6.5–8.1)

## 2021-01-10 LAB — CBC
HCT: 33.1 % — ABNORMAL LOW (ref 36.0–46.0)
Hemoglobin: 10.7 g/dL — ABNORMAL LOW (ref 12.0–15.0)
MCH: 25.4 pg — ABNORMAL LOW (ref 26.0–34.0)
MCHC: 32.3 g/dL (ref 30.0–36.0)
MCV: 78.6 fL — ABNORMAL LOW (ref 80.0–100.0)
Platelets: 322 10*3/uL (ref 150–400)
RBC: 4.21 MIL/uL (ref 3.87–5.11)
RDW: 15.5 % (ref 11.5–15.5)
WBC: 18.3 10*3/uL — ABNORMAL HIGH (ref 4.0–10.5)
nRBC: 0 % (ref 0.0–0.2)

## 2021-01-10 LAB — IRON AND TIBC
Iron: 19 ug/dL — ABNORMAL LOW (ref 28–170)
Saturation Ratios: 5 % — ABNORMAL LOW (ref 10.4–31.8)
TIBC: 390 ug/dL (ref 250–450)
UIBC: 371 ug/dL

## 2021-01-10 LAB — MAGNESIUM: Magnesium: 2.2 mg/dL (ref 1.7–2.4)

## 2021-01-10 LAB — MRSA NEXT GEN BY PCR, NASAL: MRSA by PCR Next Gen: NOT DETECTED

## 2021-01-10 LAB — I-STAT ARTERIAL BLOOD GAS, ED
Acid-base deficit: 6 mmol/L — ABNORMAL HIGH (ref 0.0–2.0)
Bicarbonate: 18.9 mmol/L — ABNORMAL LOW (ref 20.0–28.0)
Calcium, Ion: 1.21 mmol/L (ref 1.15–1.40)
HCT: 33 % — ABNORMAL LOW (ref 36.0–46.0)
Hemoglobin: 11.2 g/dL — ABNORMAL LOW (ref 12.0–15.0)
O2 Saturation: 99 %
Patient temperature: 98.5
Potassium: 2.7 mmol/L — CL (ref 3.5–5.1)
Sodium: 139 mmol/L (ref 135–145)
TCO2: 20 mmol/L — ABNORMAL LOW (ref 22–32)
pCO2 arterial: 34.9 mmHg (ref 32.0–48.0)
pH, Arterial: 7.34 — ABNORMAL LOW (ref 7.350–7.450)
pO2, Arterial: 151 mmHg — ABNORMAL HIGH (ref 83.0–108.0)

## 2021-01-10 MED ORDER — ALBUTEROL SULFATE (2.5 MG/3ML) 0.083% IN NEBU: 2.5000 mg | INHALATION_SOLUTION | Freq: Four times a day (QID) | RESPIRATORY_TRACT | Status: DC | PRN

## 2021-01-10 MED ORDER — IPRATROPIUM BROMIDE 0.02 % IN SOLN
0.5000 mg | Freq: Once | RESPIRATORY_TRACT | Status: AC
  Administered 2021-01-10: 0.5 mg via RESPIRATORY_TRACT
  Filled 2021-01-10: qty 2.5

## 2021-01-10 MED ORDER — GUAIFENESIN ER 600 MG PO TB12
600.0000 mg | ORAL_TABLET | Freq: Two times a day (BID) | ORAL | Status: DC
  Administered 2021-01-10 – 2021-01-11 (×2): 600 mg via ORAL
  Filled 2021-01-10 (×2): qty 1

## 2021-01-10 MED ORDER — MONTELUKAST SODIUM 10 MG PO TABS
10.0000 mg | ORAL_TABLET | Freq: Every day | ORAL | Status: DC
  Administered 2021-01-10: 10 mg via ORAL
  Filled 2021-01-10: qty 1

## 2021-01-10 MED ORDER — ALBUTEROL SULFATE (2.5 MG/3ML) 0.083% IN NEBU
5.0000 mg | INHALATION_SOLUTION | Freq: Once | RESPIRATORY_TRACT | Status: AC
  Administered 2021-01-10: 5 mg via RESPIRATORY_TRACT
  Filled 2021-01-10: qty 6

## 2021-01-10 MED ORDER — CHLORHEXIDINE GLUCONATE CLOTH 2 % EX PADS
6.0000 | MEDICATED_PAD | Freq: Every day | CUTANEOUS | Status: DC
  Administered 2021-01-10: 6 via TOPICAL

## 2021-01-10 MED ORDER — SODIUM CHLORIDE 0.9 % IV SOLN: INTRAVENOUS | Status: DC

## 2021-01-10 MED ORDER — FLUTICASONE PROPIONATE 50 MCG/ACT NA SUSP
2.0000 | Freq: Every day | NASAL | Status: DC
Start: 2021-01-10 — End: 2021-01-11
  Administered 2021-01-10 – 2021-01-11 (×2): 2 via NASAL
  Filled 2021-01-10 (×2): qty 16

## 2021-01-10 MED ORDER — POTASSIUM CHLORIDE 10 MEQ/100ML IV SOLN
10.0000 meq | INTRAVENOUS | Status: DC
  Administered 2021-01-10: 10 meq via INTRAVENOUS
  Filled 2021-01-10: qty 100

## 2021-01-10 MED ORDER — LORATADINE 10 MG PO TABS
10.0000 mg | ORAL_TABLET | Freq: Every day | ORAL | Status: DC
  Administered 2021-01-10 – 2021-01-11 (×2): 10 mg via ORAL
  Filled 2021-01-10 (×2): qty 1

## 2021-01-10 MED ORDER — POTASSIUM CHLORIDE CRYS ER 20 MEQ PO TBCR
40.0000 meq | EXTENDED_RELEASE_TABLET | Freq: Every day | ORAL | Status: DC
  Administered 2021-01-10: 40 meq via ORAL
  Filled 2021-01-10: qty 2

## 2021-01-10 MED ORDER — ACETAMINOPHEN 650 MG RE SUPP: 650.0000 mg | Freq: Four times a day (QID) | RECTAL | Status: DC | PRN

## 2021-01-10 MED ORDER — ACETAMINOPHEN 325 MG PO TABS: 650.0000 mg | ORAL_TABLET | Freq: Four times a day (QID) | ORAL | Status: DC | PRN

## 2021-01-10 MED ORDER — ENOXAPARIN SODIUM 40 MG/0.4ML IJ SOSY
40.0000 mg | PREFILLED_SYRINGE | INTRAMUSCULAR | Status: DC
  Administered 2021-01-10: 40 mg via SUBCUTANEOUS
  Filled 2021-01-10: qty 0.4

## 2021-01-10 NOTE — ED Notes (Signed)
This nurse assisted patient ambulating to the bathroom. Patient ambulated well. O2 saturation of 100% upon returning to room. Patient was winded and beginning to cough when returning to room. Dr. Judd Lien notified.

## 2021-01-10 NOTE — ED Notes (Signed)
Pt is speaking in complete sentences and is not in distress; normal breathing pattern. Pt has calm demeanor and states that she feel much better. Respirations WDL and 100%.

## 2021-01-10 NOTE — ED Notes (Signed)
RT Note: RT in to see Pt. to re-assess respiratory status from previously being on BiPAP, continuous Nebulizer, currently resting in no distress with Father at bedside.

## 2021-01-10 NOTE — Progress Notes (Signed)
Patient weaned off NIV to room air.  Patient tolerating well.  Patient's SPO2 is 100%, HR is 114, RR 18.  RT will continue to monitor.

## 2021-01-10 NOTE — ED Notes (Signed)
Patient call out requesting RT. Patient request to be taken off bipap. RT in room to remove bipap. Will continue to monitor.

## 2021-01-10 NOTE — Progress Notes (Signed)
EEG complete - results pending 

## 2021-01-10 NOTE — Procedures (Signed)
Patient Name: TISHAWNA LAROUCHE  MRN: [id number]  Epilepsy Attending: Charlsie Quest  Referring Physician/Provider: Dr Margie Ege Date: 01/10/2021 Duration: 23.01 mins  Patient history: 25yo F with syncope. EEG to evaluate for seizure  Level of alertness: Awake, asleep  AEDs during EEG study: None  Technical aspects: This EEG study was done with scalp electrodes positioned according to the 10-20 International system of electrode placement. Electrical activity was acquired at a sampling rate of 500Hz  and reviewed with a high frequency filter of 70Hz  and a low frequency filter of 1Hz . EEG data were recorded continuously and digitally stored.   Description: The posterior dominant rhythm consists of 10 Hz activity of moderate voltage (25-35 uV) seen predominantly in posterior head regions, symmetric and reactive to eye opening and eye closing. Sleep was characterized by vertex waves, sleep spindles (12 to 14 Hz), maximal frontocentral region.  Hyperventilation and photic stimulation were not performed.     IMPRESSION: This study is within normal limits. No seizures or epileptiform discharges were seen throughout the recording.  Brenlynn Fake 

## 2021-01-10 NOTE — Plan of Care (Signed)
Discussed with patient in front of family plan of care for the evening, pain management and hydration with some teach back displayed.  Problem: Education: Goal: Knowledge of General Education information will improve Description: Including pain rating scale, medication(s)/side effects and non-pharmacologic comfort measures Outcome: Progressing

## 2021-01-10 NOTE — H&P (Signed)
History and Physical    MELROSE KEARSE PRF:[id number] DOB: [birth year] DOA: 01/09/2021  PCP: Madelin Headings, MD  Patient coming from: Home  Chief Complaint: asthma attack  HPI: Brittany Simon is a 25 y.o. female with medical history significant of asthma. Presenting with dyspnea, syncopal episode. She reports that she was in her normal state of health until around 8pm last night. She was Christmas shopping when she began to feel short of breath. She tried 2 doses of her rescue inhaler in the store, but it did not help. She did not have any chest pain, but she started feeling dizzy and weak. She moved to a friend's car, where she took 2 more doses of her rescue inhaler. It didn't help her dyspnea. So she asked to be taken to the ED. On the way over, her dizziness worsened. She was able to get into the ED, but she immediately passed out. The next thing she remembers is that she woke up on BiPap.  Of note, she has had increased difficulty controlling her asthma over the last several months. She is scheduled to see an asthma specialist in 2 weeks.    ED Course: She was placed on BiPap and started on steroids, nebs, and mag. [facility] was called for admission.   Review of Systems:  Denies CP, ab pain, N/V/D, fever. Reports syncopal episode w/ incontinence. Review of systems is otherwise negative for all not mentioned in HPI.   PMHx Past Medical History:  Diagnosis Date   Allergy    Anemia 02/2018   Asthma    Prematurity, fetus 35-36 completed weeks of gestation     4 40z 36 weeks    PSHx History reviewed. No pertinent surgical history.  SocHx  reports that she has never smoked. She has never used smokeless tobacco. She reports current alcohol use. She reports that she does not use drugs.  No Known Allergies  FamHx Family History  Problem Relation Age of Onset   Colon polyps Mother    Asthma Father    Colon cancer Neg Hx    Esophageal cancer Neg Hx    Rectal cancer Neg Hx    Stomach  cancer Neg Hx     Prior to Admission medications   Medication Sig Start Date End Date Taking? Authorizing Provider  albuterol (PROAIR HFA) 108 (90 Base) MCG/ACT inhaler Inhale 2 puffs into the lungs every 6 (six) hours as needed for wheezing. Needs follow up for refills. 210 021 1746 11/12/20  Yes Panosh, Neta Mends, MD  betamethasone dipropionate 0.05 % lotion SMARTSIG:Sparingly Topical 05/16/19  Yes [provider]  fexofenadine (ALLEGRA ALLERGY) 180 MG tablet Take 1 tablet (180 mg total) by mouth daily. 10/31/20  Yes Nelwyn Salisbury, MD  fluticasone Aleda Grana) 50 MCG/ACT nasal spray 2 spray each nostril qd 10/31/20  Yes Nelwyn Salisbury, MD  fluticasone furoate-vilanterol (BREO ELLIPTA) 100-25 MCG/INH AEPB Inhale 1 puff into the lungs daily. For asthma 05/16/20  Yes Panosh, Neta Mends, MD  montelukast (SINGULAIR) 10 MG tablet Take 1 tablet (10 mg total) by mouth at bedtime. 10/31/20  Yes Nelwyn Salisbury, MD    Physical Exam: Vitals:   01/10/21 0900 01/10/21 1000 01/10/21 1100 01/10/21 1200  BP: 108/77 114/69 113/76 113/77  Pulse: (!) 110 (!) 128 99 (!) 121  Resp: 20 (!) 22 18 20   Temp:      TempSrc:      SpO2: 100% 99% 100% 100%  Weight:      Height:  General: 25 y.o. female resting in bed in NAD Eyes: PERRL, normal sclera ENMT: Nares patent w/o discharge, orophaynx clear, dentition normal, ears w/o discharge/lesions/ulcers Neck: Supple, trachea midline Cardiovascular: RRR, +S1, S2, no m/g/r, equal pulses throughout Respiratory: scattered wheeze, normal WOB GI: BS+, NDNT, no masses noted, no organomegaly noted MSK: No e/c/c Neuro: A&O x 3, no focal deficits Psyc: Appropriate interaction and affect, calm/cooperative  Labs on Admission: I have personally reviewed following labs and imaging studies  CBC: Recent Labs  Lab 01/09/21 2013 01/09/21 2209 01/10/21 0046  WBC 29.1*  --   --   NEUTROABS 11.1*  --   --   HGB 11.7* 12.2 11.2*  HCT 38.4 36.0 33.0*  MCV 81.7  --    --   PLT 400  --   --    Basic Metabolic Panel: Recent Labs  Lab 01/09/21 2013 01/09/21 2209 01/10/21 0046  NA 139 138 139  K 3.9 3.2* 2.7*  CL 104  --   --   CO2 19*  --   --   GLUCOSE 221*  --   --   BUN 9  --   --   CREATININE 0.69  --   --   CALCIUM 10.2  --   --    GFR: Estimated Creatinine Clearance: 100.3 mL/min (by C-G formula based on SCr of 0.69 mg/dL). Liver Function Tests: No results for input(s): AST, ALT, ALKPHOS, BILITOT, PROT, ALBUMIN in the last 168 hours. No results for input(s): LIPASE, AMYLASE in the last 168 hours. No results for input(s): AMMONIA in the last 168 hours. Coagulation Profile: No results for input(s): INR, PROTIME in the last 168 hours. Cardiac Enzymes: No results for input(s): CKTOTAL, CKMB, CKMBINDEX, TROPONINI in the last 168 hours. BNP (last 3 results) No results for input(s): PROBNP in the last 8760 hours. HbA1C: No results for input(s): HGBA1C in the last 72 hours. CBG: No results for input(s): GLUCAP in the last 168 hours. Lipid Profile: No results for input(s): CHOL, HDL, LDLCALC, TRIG, CHOLHDL, LDLDIRECT in the last 72 hours. Thyroid Function Tests: No results for input(s): TSH, T4TOTAL, FREET4, T3FREE, THYROIDAB in the last 72 hours. Anemia Panel: No results for input(s): VITAMINB12, FOLATE, FERRITIN, TIBC, IRON, RETICCTPCT in the last 72 hours. Urine analysis: No results found for: COLORURINE, APPEARANCEUR, LABSPEC, PHURINE, GLUCOSEU, HGBUR, BILIRUBINUR, KETONESUR, PROTEINUR, UROBILINOGEN, NITRITE, LEUKOCYTESUR  Radiological Exams on Admission: DG Chest Port 1 View  Result Date: 01/09/2021 CLINICAL DATA:  Dyspnea EXAM: PORTABLE CHEST 1 VIEW COMPARISON:  11/19/2020 FINDINGS: The heart size and mediastinal contours are within normal limits. Both lungs are clear. Nodular density at the right costophrenic angle likely represents a nipple shadow. The visualized skeletal structures are unremarkable. IMPRESSION: No active disease.  Electronically Signed   By: Helyn Numbers M.D.   On: 01/09/2021 21:42    EKG: Independently reviewed. Sinus tach, no st elevations  Assessment/Plan Asthma attack     - placed in obs, SDU     - now off BiPap     - still with good exp wheeze; but she's moving good air and now down to RA     - continue steroids, nebs     - has follow up with asthma specialist in 2 weeks  Syncopal episode     - likely d/t above; however, it was noted that she was incontinence of bladder during this episode; no tonic-clonic episode noted     - check EEG  Hypokalemia     -  last K+ was 2.7. Check Mg2+ and replace K+  Normocytic anemia     - no evidence of bleed     - check iron studies  Hyperlglycemia     - no Hx of DM     - check A1c  DVT prophylaxis: lovenox  Code Status: FULL  Family Communication: w/ family at bedside  Consults called: None   Status is: Observation  The patient remains OBS appropriate and will d/c before 2 midnights.  Teddy Spike DO [facility]  If 7PM-7AM, please contact night-coverage [email or web address]  01/10/2021, 3:25 PM

## 2021-01-10 NOTE — ED Notes (Signed)
Patient requesting to come off bipap. This nurse explains possible complications of removing at this time. Patient is okay with staying on it at this time.

## 2021-01-10 NOTE — ED Notes (Signed)
Nurse in room to check on patient. Patient sitting up in bed conversing with RT, which is at bedside. Patient denies any needs at this time. Will continue to monitor. Bed in lowest locked position, call bell in reach.

## 2021-01-11 ENCOUNTER — Telehealth: Payer: Self-pay | Admitting: Internal Medicine

## 2021-01-11 DIAGNOSIS — J9601 Acute respiratory failure with hypoxia: Secondary | ICD-10-CM | POA: Diagnosis not present

## 2021-01-11 DIAGNOSIS — J45901 Unspecified asthma with (acute) exacerbation: Secondary | ICD-10-CM | POA: Diagnosis not present

## 2021-01-11 LAB — CBC
HCT: 34.4 % — ABNORMAL LOW (ref 36.0–46.0)
Hemoglobin: 10.8 g/dL — ABNORMAL LOW (ref 12.0–15.0)
MCH: 24.9 pg — ABNORMAL LOW (ref 26.0–34.0)
MCHC: 31.4 g/dL (ref 30.0–36.0)
MCV: 79.3 fL — ABNORMAL LOW (ref 80.0–100.0)
Platelets: 325 10*3/uL (ref 150–400)
RBC: 4.34 MIL/uL (ref 3.87–5.11)
RDW: 15.8 % — ABNORMAL HIGH (ref 11.5–15.5)
WBC: 20 10*3/uL — ABNORMAL HIGH (ref 4.0–10.5)
nRBC: 0 % (ref 0.0–0.2)

## 2021-01-11 LAB — COMPREHENSIVE METABOLIC PANEL
ALT: 20 U/L (ref 0–44)
AST: 19 U/L (ref 15–41)
Albumin: 3.7 g/dL (ref 3.5–5.0)
Alkaline Phosphatase: 55 U/L (ref 38–126)
Anion gap: 8 (ref 5–15)
BUN: 11 mg/dL (ref 6–20)
CO2: 21 mmol/L — ABNORMAL LOW (ref 22–32)
Calcium: 9.2 mg/dL (ref 8.9–10.3)
Chloride: 111 mmol/L (ref 98–111)
Creatinine, Ser: 0.47 mg/dL (ref 0.44–1.00)
GFR, Estimated: >60 mL/min (ref >60)
Glucose, Bld: 101 mg/dL — ABNORMAL HIGH (ref 70–99)
Potassium: 4.1 mmol/L (ref 3.5–5.1)
Sodium: 140 mmol/L (ref 135–145)
Total Bilirubin: 0.5 mg/dL (ref 0.3–1.2)
Total Protein: 7.1 g/dL (ref 6.5–8.1)

## 2021-01-11 LAB — HIV ANTIBODY (ROUTINE TESTING W REFLEX): HIV Screen 4th Generation wRfx: NONREACTIVE

## 2021-01-11 LAB — PATHOLOGIST SMEAR REVIEW

## 2021-01-11 MED ORDER — PREDNISONE 10 MG PO TABS: 40.0000 mg | ORAL_TABLET | Freq: Every day | ORAL | 0 refills | Status: AC

## 2021-01-11 MED ORDER — PREDNISONE 20 MG PO TABS
40.0000 mg | ORAL_TABLET | Freq: Every day | ORAL | Status: DC
  Administered 2021-01-11: 40 mg via ORAL
  Filled 2021-01-11: qty 2

## 2021-01-11 MED ORDER — ALBUTEROL SULFATE (2.5 MG/3ML) 0.083% IN NEBU: 2.5000 mg | INHALATION_SOLUTION | RESPIRATORY_TRACT | 2 refills | Status: DC | PRN

## 2021-01-11 NOTE — Progress Notes (Signed)
Patient has received Nebulizer from social work. Education has been provided and IV's have been removed. Dishcharged at 0941 Anda Kraft, RN 01/11/2021

## 2021-01-11 NOTE — Telephone Encounter (Signed)
Pt has been added to schedule on 01/23/2021

## 2021-01-11 NOTE — Telephone Encounter (Signed)
Pt was discharge from [place] today 01-11-2021 and needs post hos follow up this year.  Dr Fabian Sharp next open is [place]

## 2021-01-11 NOTE — Discharge Summary (Signed)
Physician Discharge Summary  Brittany Simon:[id number] DOB: 10-06-1995 DOA: 01/09/2021  PCP: Madelin Headings, MD  Admit date: 01/09/2021 Discharge date: 01/11/2021  Admitted From: Home Disposition: Home  Recommendations for Outpatient Follow-up:  Follow up with PCP in 1-2 weeks Follow-up with allergist, Dr. Lucie Leather as scheduled on 01/29/2021 Discharged on prednisone to complete 5-day course of steroids Ordered home nebulizer to use as needed with albuterol  Home Health: No Equipment/Devices: Nebulizer unit  Discharge Condition: Stable CODE STATUS: Full code Diet recommendation: Heart healthy diet  History of present illness:  Brittany Simon is a 25 year old female with past medical history significant for asthma who presented to [facility] ED on 12/7 following progressive shortness of breath with subsequent syncopal episode.  Patient reports that she was Christmas shopping and began to feel short of breath, attempted to doses of her rescue inhaler in the store but did not help.  Denied any chest pain but she started feeling dizzy and weak and was moved to a friend's car which she took 2 more doses of her inhaler.  Given no improvement with her dyspnea, she was asked to be taken to the ED for further evaluation.  On the way to the ED, her dizziness worsens and subsequently passed out.  The next thing she recalls is being awake on BiPAP.  Patient reports difficulty controlling her asthma over the last several months and is scheduled to see an asthma specialist in a few weeks.  In the ED, temperature 98.8 F, HR 114, RR 17, BP 92/58, SPO2 100% on 6 L nasal cannula followed by being placed on BiPAP.  VBG with pH 7.2, PCO2 32, PO2 24.  Sodium 139, potassium 3.9, chloride 104, CO2 19, glucose 221, BUN 9, creatinine 0.69.  WBC 29.1, hemoglobin 11.7, platelets 400.  COVID-19 PCR negative.  Influenza A/B PCR negative.  Chest x-ray with no active disease process.  The ED, patient was  placed on BiPAP, started on IV steroids, given nebs, IV magnesium.  Hospitalist service was consulted for further evaluation management patient was simply transferred to [facility] for further treatment of asthma exacerbation.  Hospital course:  Acute asthma exacerbation Patient presenting to the ED following progressive shortness of breath with no improvement despite albuterol rescue inhaler use; ultimately leading to her to pass out.  Patient reports has had issues with controlling her asthma and over the last few months.  On arrival, patient was started on IV steroids, scheduled neb treatments and placed on BiPAP.  Patient was able to titrate off of BiPAP to nasal cannula oxygen and eventually was able to titrate off.  Patient was given IV magnesium.  Patient's symptoms have remarkably improved and states that her breathing is back to her normal baseline.  Patient has scheduled follow-up with an asthma specialist on January 29, 2021.  Patient will continue her Breo Ellipta inhaler, montelukast on discharge.  Also will continue prednisone to complete a 5-day course of steroids.  Also will discharge with a home nebulizer unit to use as needed with albuterol.  Syncopal episode Etiology likely secondary to asthma exacerbation as above.  No fall, no reported seizure-like activity, did not have any trauma to her head.  TSH within normal limits.  EEG was performed that did not show any elliptic form or seizure-like activity.  Following improvement of her breathing, patient's mentation returned back to baseline.  Hypokalemia Potassium 2.7, was repleted.  Potassium normalized at time of discharge and was 4.1.  Hyperglycemia  Elevated glucose on arrival, hemoglobin A1c 5.8.  Will need further dietary education.  Outpatient follow-up with PCP.    Obesity Body mass index is 29.31 kg/m.  Discussed with patient needs for aggressive lifestyle changes/weight loss as this complicates all facets of  care.  Outpatient follow-up with PCP.    Discharge Diagnoses:  Principal Problem:   Asthma exacerbation Active Problems:   Acute respiratory failure with hypoxia [facility])    Discharge Instructions  Discharge Instructions     Call MD for:  difficulty breathing, headache or visual disturbances   Complete by: As directed    Call MD for:  extreme fatigue   Complete by: As directed    Call MD for:  persistant dizziness or light-headedness   Complete by: As directed    Call MD for:  persistant nausea and vomiting   Complete by: As directed    Call MD for:  severe uncontrolled pain   Complete by: As directed    Call MD for:  temperature >100.4   Complete by: As directed    Diet - low sodium heart healthy   Complete by: As directed    Increase activity slowly   Complete by: As directed       Allergies as of 01/11/2021   No Known Allergies      Medication List     TAKE these medications    albuterol 108 (90 Base) MCG/ACT inhaler Commonly known as: ProAir HFA Inhale 2 puffs into the lungs every 6 (six) hours as needed for wheezing. Needs follow up for refills. [phone] What changed: Another medication with the same name was added. Make sure you understand how and when to take each.   albuterol (2.5 MG/3ML) 0.083% nebulizer solution Commonly known as: PROVENTIL Take 3 mLs (2.5 mg total) by nebulization every 4 (four) hours as needed for wheezing or shortness of breath. What changed: You were already taking a medication with the same name, and this prescription was added. Make sure you understand how and when to take each.   betamethasone dipropionate 0.05 % lotion SMARTSIG:Sparingly Topical   fexofenadine 180 MG tablet Commonly known as: Allegra Allergy Take 1 tablet (180 mg total) by mouth daily.   fluticasone 50 MCG/ACT nasal spray Commonly known as: Flonase 2 spray each nostril qd   fluticasone furoate-vilanterol 100-25 MCG/INH Aepb Commonly known as: BREO  ELLIPTA Inhale 1 puff into the lungs daily. For asthma   montelukast 10 MG tablet Commonly known as: SINGULAIR Take 1 tablet (10 mg total) by mouth at bedtime.   predniSONE 10 MG tablet Commonly known as: DELTASONE Take 4 tablets (40 mg total) by mouth daily for 3 days. Start taking on: January 12, 2021               Durable Medical Equipment  (From admission, onward)           Start     Ordered   01/11/21 0756  For home use only DME Nebulizer machine  Once       Question Answer Comment  Patient needs a nebulizer to treat with the following condition Asthma   Length of Need Lifetime      01/11/21 0755            Follow-up Information     Panosh, Neta Mends, MD. Schedule an appointment as soon as possible for a visit in 1 week(s).   Specialties: Internal Medicine, Pediatrics Contact information: [address] [place] Flake [place] [address] [phone]  No Known Allergies  Consultations: None   Procedures/Studies: DG Chest Port 1 View  Result Date: 01/09/2021 CLINICAL DATA:  Dyspnea EXAM: PORTABLE CHEST 1 VIEW COMPARISON:  11/19/2020 FINDINGS: The heart size and mediastinal contours are within normal limits. Both lungs are clear. Nodular density at the right costophrenic angle likely represents a nipple shadow. The visualized skeletal structures are unremarkable. IMPRESSION: No active disease. Electronically Signed   By: Helyn Numbers M.D.   On: 01/09/2021 21:42   EEG adult  Result Date: 01/10/2021 Charlsie Quest, MD     01/10/2021  8:58 PM Patient Name: SHAWNIE NICOLE MRN: [id number] Epilepsy Attending: Charlsie Quest Referring Physician/Provider: Dr Margie Ege Date: 01/10/2021 Duration: 23.01 mins Patient history: 25yo F with syncope. EEG to evaluate for seizure Level of alertness: Awake, asleep AEDs during EEG study: None Technical aspects: This EEG study was done with scalp electrodes positioned according to the 10-20  International system of electrode placement. Electrical activity was acquired at a sampling rate of 500Hz  and reviewed with a high frequency filter of 70Hz  and a low frequency filter of 1Hz . EEG data were recorded continuously and digitally stored. Description: The posterior dominant rhythm consists of 10 Hz activity of moderate voltage (25-35 uV) seen predominantly in posterior head regions, symmetric and reactive to eye opening and eye closing. Sleep was characterized by vertex waves, sleep spindles (12 to 14 Hz), maximal frontocentral region.  Hyperventilation and photic stimulation were not performed.   IMPRESSION: This study is within normal limits. No seizures or epileptiform discharges were seen throughout the recording. Priyanka     Subjective: Patient seen examined bedside, resting comfortably.  No complaints this morning.  Mother present at bedside.  States breathing is back to her normal baseline.  Has been titrated off oxygen.  States ready for discharge home.  Discussed continue prednisone on discharge and will discharge with a home nebulizer unit to use as needed.  Has scheduled follow-up with asthma specialist in a couple weeks.  No other questions or concerns at this time.  Denies headache, no fever/chills/night sweats, no nausea/vomiting/diarrhea, no chest pain, no palpitations, no current shortness of breath, no abdominal pain, no weakness, no fatigue, no paresthesias.  No acute events overnight per nursing.  Discharge Exam: Vitals:   01/11/21 0459 01/11/21 0751  BP:    Pulse: 83   Resp: 16   Temp:  98.4 F (36.9 C)  SpO2: 96%    Vitals:   01/11/21 0315 01/11/21 0400 01/11/21 0459 01/11/21 0751  BP: 106/73     Pulse: 98 78 83   Resp: 20 17 16    Temp: 97.6 F (36.4 C)   98.4 F (36.9 C)  TempSrc: Oral   Oral  SpO2: 96% 96% 96%   Weight:      Height:        General: Pt is alert, awake, not in acute distress Cardiovascular: RRR, S1/S2 +, no rubs, no  gallops Respiratory: Mild late expiratory wheezing bilateral lower lobes, no crackles, no rhonchi, on room air Abdominal: Soft, NT, ND, bowel sounds + Extremities: no edema, no cyanosis    The results of significant diagnostics from this hospitalization (including imaging, microbiology, ancillary and laboratory) are listed below for reference.     Microbiology: Recent Results (from the past 240 hour(s))  Resp Panel by RT-PCR (Flu A&B, Covid) Nasopharyngeal Swab     Status: None   Collection Time: 01/09/21  8:13 PM   Specimen: Nasopharyngeal Swab; Nasopharyngeal(NP)  swabs in vial transport medium  Result Value Ref Range Status   SARS Coronavirus 2 by RT PCR NEGATIVE NEGATIVE Final    Comment: (NOTE) SARS-CoV-2 target nucleic acids are NOT DETECTED.  The SARS-CoV-2 RNA is generally detectable in upper respiratory specimens during the acute phase of infection. The lowest concentration of SARS-CoV-2 viral copies this assay can detect is 138 copies/mL. A negative result does not preclude SARS-Cov-2 infection and should not be used as the sole basis for treatment or other patient management decisions. A negative result may occur with  improper specimen collection/handling, submission of specimen other than nasopharyngeal swab, presence of viral mutation(s) within the areas targeted by this assay, and inadequate number of viral copies(<138 copies/mL). A negative result must be combined with clinical observations, patient history, and epidemiological information. The expected result is Negative.  Fact Sheet for Patients:  BloggerCourse.com  Fact Sheet for Healthcare Providers:  SeriousBroker.it  This test is no t yet approved or cleared by the [place] FDA and  has been authorized for detection and/or diagnosis of SARS-CoV-2 by FDA under an Emergency Use Authorization (EUA). This EUA will remain  in effect (meaning this test can  be used) for the duration of the COVID-19 declaration under Section 564(b)(1) of the Act, 21 U.S.C.section 360bbb-3(b)(1), unless the authorization is terminated  or revoked sooner.       Influenza A by PCR NEGATIVE NEGATIVE Final   Influenza B by PCR NEGATIVE NEGATIVE Final    Comment: (NOTE) The Xpert Xpress SARS-CoV-2/FLU/RSV plus assay is intended as an aid in the diagnosis of influenza from Nasopharyngeal swab specimens and should not be used as a sole basis for treatment. Nasal washings and aspirates are unacceptable for Xpert Xpress SARS-CoV-2/FLU/RSV testing.  Fact Sheet for Patients: BloggerCourse.com  Fact Sheet for Healthcare Providers: SeriousBroker.it  This test is not yet approved or cleared by the [place] FDA and has been authorized for detection and/or diagnosis of SARS-CoV-2 by FDA under an Emergency Use Authorization (EUA). This EUA will remain in effect (meaning this test can be used) for the duration of the COVID-19 declaration under Section 564(b)(1) of the Act, 21 U.S.C. section 360bbb-3(b)(1), unless the authorization is terminated or revoked.  Performed at [facility], [address] [place], [place] [address]   MRSA Next Gen by PCR, Nasal     Status: None   Collection Time: 01/10/21  2:56 PM   Specimen: Nasal Mucosa; Nasal Swab  Result Value Ref Range Status   MRSA by PCR Next Gen NOT DETECTED NOT DETECTED Final    Comment: (NOTE) The GeneXpert MRSA Assay (FDA approved for NASAL specimens only), is one component of a comprehensive MRSA colonization surveillance program. It is not intended to diagnose MRSA infection nor to guide or monitor treatment for MRSA infections. Test performance is not FDA approved in patients less than 58 years old. Performed at [facility], [address], [place], [place] [address]      Labs: BNP (last 3 results) No  results for input(s): BNP in the last 8760 hours. Basic Metabolic Panel: Recent Labs  Lab 01/09/21 2013 01/09/21 2209 01/10/21 0046 01/10/21 1637 01/11/21 0325  NA 139 138 139 140 140  K 3.9 3.2* 2.7* 3.6 4.1  CL 104  --   --  111 111  CO2 19*  --   --  21* 21*  GLUCOSE 221*  --   --  93 101*  BUN 9  --   --  7 11  CREATININE 0.69  --   --  0.42* 0.47  CALCIUM 10.2  --   --  9.3 9.2  MG  --   --   --  2.2  --    Liver Function Tests: Recent Labs  Lab 01/10/21 1637 01/11/21 0325  AST 24 19  ALT 21 20  ALKPHOS 55 55  BILITOT 0.3 0.5  PROT 7.4 7.1  ALBUMIN 3.9 3.7   No results for input(s): LIPASE, AMYLASE in the last 168 hours. No results for input(s): AMMONIA in the last 168 hours. CBC: Recent Labs  Lab 01/09/21 2013 01/09/21 2209 01/10/21 0046 01/10/21 1637 01/11/21 0325  WBC 29.1*  --   --  18.3* 20.0*  NEUTROABS 11.1*  --   --   --   --   HGB 11.7* 12.2 11.2* 10.7* 10.8*  HCT 38.4 36.0 33.0* 33.1* 34.4*  MCV 81.7  --   --  78.6* 79.3*  PLT 400  --   --  322 325   Cardiac Enzymes: No results for input(s): CKTOTAL, CKMB, CKMBINDEX, TROPONINI in the last 168 hours. BNP: Invalid input(s): POCBNP CBG: No results for input(s): GLUCAP in the last 168 hours. D-Dimer No results for input(s): DDIMER in the last 72 hours. Hgb A1c No results for input(s): HGBA1C in the last 72 hours. Lipid Profile No results for input(s): CHOL, HDL, LDLCALC, TRIG, CHOLHDL, LDLDIRECT in the last 72 hours. Thyroid function studies No results for input(s): TSH, T4TOTAL, T3FREE, THYROIDAB in the last 72 hours.  Invalid input(s): FREET3 Anemia work up Recent Labs    01/10/21 1945  TIBC 390  IRON 19*   Urinalysis No results found for: COLORURINE, APPEARANCEUR, LABSPEC, PHURINE, GLUCOSEU, HGBUR, BILIRUBINUR, KETONESUR, PROTEINUR, UROBILINOGEN, NITRITE, LEUKOCYTESUR Sepsis Labs Invalid input(s): PROCALCITONIN,  WBC,  LACTICIDVEN Microbiology Recent Results (from the past 240  hour(s))  Resp Panel by RT-PCR (Flu A&B, Covid) Nasopharyngeal Swab     Status: None   Collection Time: 01/09/21  8:13 PM   Specimen: Nasopharyngeal Swab; Nasopharyngeal(NP) swabs in vial transport medium  Result Value Ref Range Status   SARS Coronavirus 2 by RT PCR NEGATIVE NEGATIVE Final    Comment: (NOTE) SARS-CoV-2 target nucleic acids are NOT DETECTED.  The SARS-CoV-2 RNA is generally detectable in upper respiratory specimens during the acute phase of infection. The lowest concentration of SARS-CoV-2 viral copies this assay can detect is 138 copies/mL. A negative result does not preclude SARS-Cov-2 infection and should not be used as the sole basis for treatment or other patient management decisions. A negative result may occur with  improper specimen collection/handling, submission of specimen other than nasopharyngeal swab, presence of viral mutation(s) within the areas targeted by this assay, and inadequate number of viral copies(<138 copies/mL). A negative result must be combined with clinical observations, patient history, and epidemiological information. The expected result is Negative.  Fact Sheet for Patients:  BloggerCourse.com  Fact Sheet for Healthcare Providers:  SeriousBroker.it  This test is no t yet approved or cleared by the [place] FDA and  has been authorized for detection and/or diagnosis of SARS-CoV-2 by FDA under an Emergency Use Authorization (EUA). This EUA will remain  in effect (meaning this test can be used) for the duration of the COVID-19 declaration under Section 564(b)(1) of the Act, 21 U.S.C.section 360bbb-3(b)(1), unless the authorization is terminated  or revoked sooner.       Influenza A by PCR NEGATIVE NEGATIVE Final   Influenza B by PCR  NEGATIVE NEGATIVE Final    Comment: (NOTE) The Xpert Xpress SARS-CoV-2/FLU/RSV plus assay is intended as an aid in the diagnosis of influenza from  Nasopharyngeal swab specimens and should not be used as a sole basis for treatment. Nasal washings and aspirates are unacceptable for Xpert Xpress SARS-CoV-2/FLU/RSV testing.  Fact Sheet for Patients: BloggerCourse.com  Fact Sheet for Healthcare Providers: SeriousBroker.it  This test is not yet approved or cleared by the [place] FDA and has been authorized for detection and/or diagnosis of SARS-CoV-2 by FDA under an Emergency Use Authorization (EUA). This EUA will remain in effect (meaning this test can be used) for the duration of the COVID-19 declaration under Section 564(b)(1) of the Act, 21 U.S.C. section 360bbb-3(b)(1), unless the authorization is terminated or revoked.  Performed at [facility], [address] [place], [place] [address]   MRSA Next Gen by PCR, Nasal     Status: None   Collection Time: 01/10/21  2:56 PM   Specimen: Nasal Mucosa; Nasal Swab  Result Value Ref Range Status   MRSA by PCR Next Gen NOT DETECTED NOT DETECTED Final    Comment: (NOTE) The GeneXpert MRSA Assay (FDA approved for NASAL specimens only), is one component of a comprehensive MRSA colonization surveillance program. It is not intended to diagnose MRSA infection nor to guide or monitor treatment for MRSA infections. Test performance is not FDA approved in patients less than 21 years old. Performed at [facility], [address], [place], [place] [address]      Time coordinating discharge: Over 30 minutes  SIGNED:   Alvira Philips [place], DO  [facility] 01/11/2021, 7:58 AM

## 2021-01-11 NOTE — TOC Transition Note (Signed)
Transition of Care [facility]) - CM/SW Discharge Note   Patient Details  Name: IANTHA TITSWORTH MRN: [id number] Date of Birth: [birth year]  Transition of Care [facility]) CM/SW Contact:  Ida Rogue, LCSW Phone Number: 01/11/2021, 8:31 AM   Clinical Narrative:  CSW responding to MD consult for nebulizer for patient who is stable for d/c today.  Spoke with Barbette Or with [facility] who will arrange for delivery of same to patient's room. TOC will continue to follow during the course of hospitalization.      Final next level of care: Home/Self Care Barriers to Discharge: No Barriers Identified   Patient Goals and CMS Choice        Discharge Placement                       Discharge Plan and Services                                     Social Determinants of Health (SDOH) Interventions     Readmission Risk Interventions No flowsheet data found.

## 2021-01-23 ENCOUNTER — Encounter: Payer: Self-pay | Admitting: Internal Medicine

## 2021-01-23 ENCOUNTER — Ambulatory Visit: Payer: BC Managed Care – PPO | Admitting: Internal Medicine

## 2021-01-23 VITALS — BP 108/74 | HR 96 | Temp 98.4°F | Ht 62.0 in | Wt 162.4 lb

## 2021-01-23 DIAGNOSIS — R739 Hyperglycemia, unspecified: Secondary | ICD-10-CM

## 2021-01-23 DIAGNOSIS — Z23 Encounter for immunization: Secondary | ICD-10-CM

## 2021-01-23 DIAGNOSIS — D649 Anemia, unspecified: Secondary | ICD-10-CM | POA: Diagnosis not present

## 2021-01-23 DIAGNOSIS — J45909 Unspecified asthma, uncomplicated: Secondary | ICD-10-CM | POA: Diagnosis not present

## 2021-01-23 DIAGNOSIS — Z79899 Other long term (current) drug therapy: Secondary | ICD-10-CM

## 2021-01-23 DIAGNOSIS — E611 Iron deficiency: Secondary | ICD-10-CM

## 2021-01-23 DIAGNOSIS — J302 Other seasonal allergic rhinitis: Secondary | ICD-10-CM | POA: Diagnosis not present

## 2021-01-23 DIAGNOSIS — Z09 Encounter for follow-up examination after completed treatment for conditions other than malignant neoplasm: Secondary | ICD-10-CM

## 2021-01-23 NOTE — Progress Notes (Signed)
Chief Complaint  Patient presents with   Hospitalization Follow-up   Asthma    HPI: Brittany Simon 25 y.o. come in for [facility] f 12 7 - 12 9 or actue asthma exacerbation  and  syncope while waiting in Ed and had neg eeg .  Rx with bipap and steroids nebs and mag Incidental hyperglycemia and potass 2.7 Has fu dr Sharyn Lull 12 27.    Had  3 days [pred when went home.  Periods  reg  nl 5-7 days  Taking   on once a day  iron but not all the time   no excess bleeding  ROS: See pertinent positives and negatives per HPI.  Past Medical History:  Diagnosis Date   Allergy    Anemia 02/2018   Asthma    Prematurity, fetus 35-36 completed weeks of gestation     4 19z 36 weeks    Family History  Problem Relation Age of Onset   Colon polyps Mother    Asthma Father    Colon cancer Neg Hx    Esophageal cancer Neg Hx    Rectal cancer Neg Hx    Stomach cancer Neg Hx     Social History   Socioeconomic History   Marital status: Single    Spouse name: Not on file   Number of children: 0   Years of education: Not on file   Highest education level: Not on file  Occupational History   Occupation: Runner, broadcasting/film/video  Tobacco Use   Smoking status: Never   Smokeless tobacco: Never  Vaping Use   Vaping Use: Never used  Substance and Sexual Activity   Alcohol use: Yes    Comment: occassionally    Drug use: No   Sexual activity: Not on file  Other Topics Concern   Not on file  Social History Narrative   Single   Orig from [facility] of 4  One dog      IB program.    [place].   [employer] fall 15   Girls scouts    Education major, spanish minor    [employer]  4th grade    Social Determinants of Corporate investment banker Strain: Not on file  Food Insecurity: Not on file  Transportation Needs: Not on file  Physical Activity: Not on file  Stress: Not on file  Social Connections: Not on file    Outpatient Medications Prior to Visit  Medication Sig Dispense Refill   albuterol  (PROAIR HFA) 108 (90 Base) MCG/ACT inhaler Inhale 2 puffs into the lungs every 6 (six) hours as needed for wheezing. Needs follow up for refills. [phone] 20.1 g 1   albuterol (PROVENTIL) (2.5 MG/3ML) 0.083% nebulizer solution Take 3 mLs (2.5 mg total) by nebulization every 4 (four) hours as needed for wheezing or shortness of breath. 75 mL 2   betamethasone dipropionate 0.05 % lotion SMARTSIG:Sparingly Topical     fexofenadine (ALLEGRA ALLERGY) 180 MG tablet Take 1 tablet (180 mg total) by mouth daily. 1 tablet 0   fluticasone (FLONASE) 50 MCG/ACT nasal spray 2 spray each nostril qd 16 g 3   fluticasone furoate-vilanterol (BREO ELLIPTA) 100-25 MCG/INH AEPB Inhale 1 puff into the lungs daily. For asthma 1 each 6   montelukast (SINGULAIR) 10 MG tablet Take 1 tablet (10 mg total) by mouth at bedtime. 30 tablet 11   No facility-administered medications prior to visit.     EXAM:  BP 108/74 (BP Location: Left  Arm, Patient Position: Sitting, Cuff Size: Normal)    Pulse 96    Temp 98.4 F (36.9 C)    Ht 5\' 2"  (1.575 m)    Wt 162 lb 6.4 oz (73.7 kg)    LMP 01/11/2021 (Exact Date)    SpO2 95%    BMI 29.70 kg/m   Body mass index is 29.7 kg/m.  GENERAL: vitals reviewed and listed above, alert, oriented, appears well hydrated and in no acute distress HEENT: atraumatic, conjunctiva  clear, no obvious abnormalities on inspection of external nose and ears OP : masked  NECK: no obvious masses on inspection palpation  LUNGS: clear to auscultation bilaterally, no wheezes, rales or rhonchi, good air movement CV: HRRR, no clubbing cyanosis or  peripheral edema nl cap refill  MS: moves all extremities without noticeable focal  abnormality PSYCH: pleasant and cooperative, no obvious depression or anxiety Lab Results  Component Value Date   WBC 20.0 (H) 01/11/2021   HGB 10.8 (L) 01/11/2021   HCT 34.4 (L) 01/11/2021   PLT 325 01/11/2021   GLUCOSE 101 (H) 01/11/2021   CHOL 175 09/02/2019   TRIG 67  09/02/2019   HDL 54 09/02/2019   LDLCALC 106 (H) 09/02/2019   ALT 20 01/11/2021   AST 19 01/11/2021   NA 140 01/11/2021   K 4.1 01/11/2021   CL 111 01/11/2021   CREATININE 0.47 01/11/2021   BUN 11 01/11/2021   CO2 21 (L) 01/11/2021   TSH 1.89 09/02/2019   HGBA1C 5.8 (H) 09/02/2019   BP Readings from Last 3 Encounters:  01/23/21 108/74  01/11/21 136/77  11/19/20 124/86    ASSESSMENT AND PLAN:  Discussed the following assessment and plan:  Hospital discharge follow-up - Plan: CBC with Differential/Platelet, Basic metabolic panel, Hemoglobin A1c, IBC Panel([facility])  Asthma, unspecified asthma severity, unspecified whether complicated, unspecified whether persistent - acute resp hypoxia  rx bipap and o2 steroids  - Plan: CBC with Differential/Platelet, Basic metabolic panel, Hemoglobin A1c, IBC Panel([facility]), Flu Vaccine QUAD 6+ mos PF IM (Fluarix Quad PF)  Anemia, unspecified type - Plan: CBC with Differential/Platelet, Basic metabolic panel, Hemoglobin A1c, IBC Panel([facility])  Iron deficiency - Plan: CBC with Differential/Platelet, Basic metabolic panel, Hemoglobin A1c, IBC Panel([facility])  Seasonal allergic rhinitis, unspecified trigger - Plan: CBC with Differential/Platelet, Basic metabolic panel, Hemoglobin A1c, IBC Panel([facility])  Medication management - Plan: CBC with Differential/Platelet, Basic metabolic panel, Hemoglobin A1c, IBC Panel([facility])  Hyperglycemia - Plan: CBC with Differential/Platelet, Basic metabolic panel, Hemoglobin A1c, IBC Panel([facility]) To keep appointment with allergy asthma because of a life-threatening asthmatic exacerbation on controller medicine.  An underlying clinical allergy Will follow-up on the mild anemia and blood sugar status.lab in a month  -Patient advised to return or notify health care team  if  new concerns arise.  Patient Instructions  Keep appt with allergy  asthma.  NO change in meds at this time.   The mild anemia is probably   iron deficiency .  Take iron supplement every day or every other day .     Plan  blood  work  to fu anemia and   blood sugar in  a month .  Flu vaccine today .  Let us know if any worsening of breathing .     Standley Brooking. Vian Fluegel M.D.

## 2021-01-23 NOTE — Patient Instructions (Addendum)
Keep appt with allergy  asthma.  NO change in meds at this time.   The mild anemia is probably  iron deficiency .  Take iron supplement every day or every other day .     Plan  blood  work  to fu anemia and   blood sugar in  a month .  Flu vaccine today .  Let us know if any worsening of breathing .

## 2021-01-29 ENCOUNTER — Other Ambulatory Visit: Payer: Self-pay

## 2021-01-29 ENCOUNTER — Encounter: Payer: Self-pay | Admitting: Allergy and Immunology

## 2021-01-29 ENCOUNTER — Ambulatory Visit: Payer: BC Managed Care – PPO | Admitting: Allergy and Immunology

## 2021-01-29 VITALS — BP 100/76 | HR 116 | Temp 98.0°F | Resp 16 | Ht 62.0 in | Wt 160.8 lb

## 2021-01-29 DIAGNOSIS — J4550 Severe persistent asthma, uncomplicated: Secondary | ICD-10-CM | POA: Diagnosis not present

## 2021-01-29 DIAGNOSIS — J3089 Other allergic rhinitis: Secondary | ICD-10-CM | POA: Diagnosis not present

## 2021-01-29 DIAGNOSIS — D7219 Other eosinophilia: Secondary | ICD-10-CM

## 2021-01-29 DIAGNOSIS — J339 Nasal polyp, unspecified: Secondary | ICD-10-CM

## 2021-01-29 DIAGNOSIS — D72820 Lymphocytosis (symptomatic): Secondary | ICD-10-CM

## 2021-01-29 DIAGNOSIS — D72824 Basophilia: Secondary | ICD-10-CM

## 2021-01-29 DIAGNOSIS — D7218 Eosinophilia in diseases classified elsewhere: Secondary | ICD-10-CM

## 2021-01-29 DIAGNOSIS — J301 Allergic rhinitis due to pollen: Secondary | ICD-10-CM

## 2021-01-29 DIAGNOSIS — M301 Polyarteritis with lung involvement [Churg-Strauss]: Secondary | ICD-10-CM

## 2021-01-29 DIAGNOSIS — T781XXA Other adverse food reactions, not elsewhere classified, initial encounter: Secondary | ICD-10-CM

## 2021-01-29 MED ORDER — EPINEPHRINE 0.3 MG/0.3ML IJ SOAJ: 0.3000 mg | Freq: Once | INTRAMUSCULAR | 2 refills | Status: AC

## 2021-01-29 MED ORDER — BENRALIZUMAB 30 MG/ML ~~LOC~~ SOSY
30.0000 mg | PREFILLED_SYRINGE | SUBCUTANEOUS | Status: DC
  Administered 2021-01-29 – 2021-03-07 (×2): 30 mg via SUBCUTANEOUS

## 2021-01-29 MED ORDER — EPINEPHRINE 0.3 MG/0.3ML IJ SOAJ: 0.3000 mg | Freq: Once | INTRAMUSCULAR | 1 refills | Status: AC

## 2021-01-29 MED ORDER — BREZTRI AEROSPHERE 160-9-4.8 MCG/ACT IN AERO: 2.0000 | INHALATION_SPRAY | Freq: Two times a day (BID) | RESPIRATORY_TRACT | 5 refills | Status: DC

## 2021-01-29 MED ORDER — RYALTRIS 665-25 MCG/ACT NA SUSP: 2.0000 | Freq: Two times a day (BID) | NASAL | 5 refills | Status: DC

## 2021-01-29 MED ORDER — CETIRIZINE HCL 10 MG PO TABS: 10.0000 mg | ORAL_TABLET | Freq: Two times a day (BID) | ORAL | 5 refills | Status: DC | PRN

## 2021-01-29 NOTE — Patient Instructions (Addendum)
°  1.  Allergen avoidance measures - dust mite, cat, dog, pollen, alternaria mold  2.  Treat and prevent inflammation:  A. Breztri - 2 inhalations 2 times per day w/ spacer (empty lungs) B. Ryaltris - 2 sprays each nostril 2 times per day (specialty pharmacy) C. Montelukast 10 mg - 1 tablet 1 time per day D. Benralizumab injection today and every 4 weeks E. Prednisone 10 mg - 1 tablet 1 time per day until return to clinic    3.  If needed:  A. Albuterol HFA - 2 inhalations every 4-6 hours  B. Cetirizine 10 mg - 1 tablet 1 time per day C. Epi-Pen, benadryl, MD/ER evaluation for allergic reaction  4.  Blood - CBC w/d, IgE, ANCA w/R  5. Return to clinic in 2 weeks or earlier if problem

## 2021-01-29 NOTE — Progress Notes (Signed)
Patient was advised by Dr. Lucie Leather to start Harrington Challenger today in room 3. Patient was given instructions of the shot room for her next appointment and was scheduled for her next appointment. Patient received her injection in her right arm and tolerated her injection well. She did stay  for almost thirty minutes and was released by Dr. Lucie Leather. Patient expressed that she had no issues with the injection. Patient was instructed to call our office with any issues or concerns.

## 2021-01-29 NOTE — Progress Notes (Signed)
[place] - [place] - [place] - [place] - [place]   Dear Dr. Regis Bill,  Thank you for referring Brittany Simon to the [facility] on 01/29/2021.   Below is a summation of this patient's evaluation and recommendations.  Thank you for your referral. I will keep you informed about this patient's response to treatment.   If you have any questions please do not hesitate to contact me.   Sincerely,  Jiles Prows, MD Allergy / Immunology [place]   ______________________________________________________________________    NEW PATIENT NOTE  Referring Provider: Burnis Medin, MD Primary Provider: Burnis Medin, MD Date of office visit: 01/29/2021    Subjective:   Chief Complaint:  Brittany Simon (DOB: [birth year]) is a 25 y.o. female who presents to the clinic on 01/29/2021 with a chief complaint of Asthma (Persistent coughing and wheezing. Has had 2 occasions in past 6 months where went to ER due to asthma attack. Last time she went to the ER was a few weeks ago, and was there for 2 nights. ACT - 14) .     HPI: [name] presents to this clinic in evaluation of asthma.  She has a long history of asthma dating back to childhood which was predominantly a problem during the spring for which she would develop some wheezing and coughing and have to use a short acting bronchodilator and then would resolve this issue by late spring of each year.  Unfortunately, in 2022 her asthma pattern changed.  She did develop her seasonal flare of asthma but this has not resolved and she has constant wheezing and coughing and she has been to the emergency room on 2 occasions including an overnight stay 09 January 2021 in association with respiratory failure requiring BiPAP administration and she has received 3 systemic steroids in 2022 to treat her asthma.  When she receives a systemic steroids she does  respond quite well.  Her asthma is very active even though she uses her Breo every day.  She can no longer exercise because of her asthma.  In addition, she does have a long history of nasal congestion and nose blowing and sneezing that would flare during the spring but this year this has been a persistent issue.  She has now developed intermittent anosmia which clears when she receives a systemic steroid.  This occurs even though she has been using her Flonase consistently.  There is also a problem eating fresh fruits and vegetables where she will develop mouth itching and throat itching.  This occurs with carrots and avocado nectarines and apples.  She was infected with COVID in September 2022 mostly manifested as an upper airway problem which fortunately appears to have cleared.  Past Medical History:  Diagnosis Date   Allergy    Anemia 02/2018   Asthma    Prematurity, fetus 35-36 completed weeks of gestation     4 40z 36 weeks    History reviewed. No pertinent surgical history.  Allergies as of 01/29/2021   No Known Allergies      Medication List    albuterol 108 (90 Base) MCG/ACT inhaler Commonly known as: ProAir HFA Inhale 2 puffs into the lungs every 6 (six) hours as needed for wheezing. Needs follow up for refills. [phone]   albuterol (2.5 MG/3ML) 0.083% nebulizer solution Commonly known as: PROVENTIL Take 3 mLs (2.5 mg total) by nebulization every 4 (four) hours as  needed for wheezing or shortness of breath.   betamethasone dipropionate 0.05 % lotion SMARTSIG:Sparingly Topical   fexofenadine 180 MG tablet Commonly known as: Allegra Allergy Take 1 tablet (180 mg total) by mouth daily.   fluticasone 50 MCG/ACT nasal spray Commonly known as: Flonase 2 spray each nostril qd   fluticasone furoate-vilanterol 100-25 MCG/INH Aepb Commonly known as: BREO ELLIPTA Inhale 1 puff into the lungs daily. For asthma   montelukast 10 MG tablet Commonly known as:  SINGULAIR Take 1 tablet (10 mg total) by mouth at bedtime.    Review of systems negative except as noted in HPI / PMHx or noted below:  Review of Systems  Constitutional: Negative.   HENT: Negative.    Eyes: Negative.   Respiratory: Negative.    Cardiovascular: Negative.   Gastrointestinal: Negative.   Genitourinary: Negative.   Musculoskeletal: Negative.   Skin: Negative.   Neurological: Negative.   Endo/Heme/Allergies: Negative.   Psychiatric/Behavioral: Negative.     Family History  Problem Relation Age of Onset   Colon polyps Mother    Asthma Father    Colon cancer Neg Hx    Esophageal cancer Neg Hx    Rectal cancer Neg Hx    Stomach cancer Neg Hx     Social History   Socioeconomic History   Marital status: Single    Spouse name: Not on file   Number of children: 0   Years of education: Not on file   Highest education level: Not on file  Occupational History   Occupation: teacher  Tobacco Use   Smoking status: Never   Smokeless tobacco: Never  Vaping Use   Vaping Use: Never used  Substance and Sexual Activity   Alcohol use: Yes    Comment: occassionally    Drug use: No   Sexual activity: Not Currently  Other Topics Concern   Not on file  Social History Narrative   Single   Orig [facility] of 4  One dog      IB program.    [place].   [employer] fall 35   Girls scouts    Education major, spanish minor    [employer]  4th grade    Environmental and Social history  Lives in a house with a dry environment, no animals look inside the household, no carpet in the bedroom, no plastic on the bed, no plastic on the pillow, no smoking ongoing with inside the household.  She is a Pharmacist, hospital of third grade.  Objective:   Vitals:   01/29/21 1408  BP: 100/76  Pulse: (!) 116  Resp: 16  Temp: 98 F (36.7 C)  SpO2: 96%   Height: 5\' 2"  (157.5 cm) Weight: 160 lb 12.8 oz (72.9 kg)  Physical Exam Constitutional:      Appearance: She is not  diaphoretic.     Comments: Coughing  HENT:     Head: Normocephalic.     Right Ear: Tympanic membrane, ear canal and external ear normal.     Left Ear: Tympanic membrane, ear canal and external ear normal.     Nose: Mucosal edema (Bilateral nasal polyps) present. No rhinorrhea.     Mouth/Throat:     Pharynx: Uvula midline. No oropharyngeal exudate.  Eyes:     Conjunctiva/sclera: Conjunctivae normal.  Neck:     Thyroid: No thyromegaly.     Trachea: Trachea normal. No tracheal tenderness or tracheal deviation.  Cardiovascular:     Rate and Rhythm: Normal rate  and regular rhythm.     Heart sounds: Normal heart sounds, S1 normal and S2 normal. No murmur heard. Pulmonary:     Effort: No respiratory distress.     Breath sounds: No stridor. Wheezing (Expiratory wheezing bilaterally) present. No rales.  Lymphadenopathy:     Head:     Right side of head: No tonsillar adenopathy.     Left side of head: No tonsillar adenopathy.     Cervical: No cervical adenopathy.  Skin:    Findings: No erythema or rash.     Nails: There is no clubbing.  Neurological:     Mental Status: She is alert.    Diagnostics: Allergy skin tests were performed.  She demonstrated hypersensitivity to house dust mite, cat, dog, pollens, and Alternaria mold.  She also demonstrated hypersensitivity to soybean.  Spirometry was performed and demonstrated an FEV1 of 0.79 @ 30 % of predicted. FEV1/FVC = 0.44.  Following the administration of nebulized albuterol her FEV1 rose to 0.92 which was a calculated increase in the FEV1 of 16%.  The patient had an Asthma Control Test with the following results: ACT Total Score: 14.    Results of blood tests obtained 09 January 2021 identifies WBC 29.1, absolute eosinophil 2600, absolute basophil 300, absolute lymphocyte 13,100, hemoglobin 11.7, platelet 400.  Results of blood tests obtained 10 January 2021 identified iron 19 UG/DL  Results of a chest x-ray obtained 09 January 2021  identified the following:  The heart size and mediastinal contours are within normal limits. Both lungs are clear. Nodular density at the right costophrenic angle likely represents a nipple shadow. The visualized skeletal structures are unremarkable.   Assessment and Plan:    1. Not well controlled severe persistent asthma   2. Eosinophilic granulomatosis with polyangiitis (EGPA) ([place])   3. Perennial allergic rhinitis   4. Seasonal allergic rhinitis due to pollen   5. Nasal polyposis   6. Pollen-food allergy, initial encounter   7. Other eosinophilia   8. Lymphocytosis   9. Basophilia     1.  Allergen avoidance measures - dust mite, cat, dog, pollen, alternaria mold  2.  Treat and prevent inflammation:  A. Breztri - 2 inhalations 2 times per day w/ spacer (empty lungs) B. Ryaltris - 2 sprays each nostril 2 times per day (specialty pharmacy) C. Montelukast 10 mg - 1 tablet 1 time per day D. Benralizumab injection today and every 4 weeks E. Prednisone 10 mg - 1 tablet 1 time per day until return to clinic    3.  If needed:  A. Albuterol HFA - 2 inhalations every 4-6 hours  B. Cetirizine 10 mg - 1 tablet 1 time per day C. Epi-Pen, benadryl, MD/ER evaluation for allergic reaction  4.  Blood - CBC w/d, IgE, ANCA w/R  5. Return to clinic in 2 weeks or earlier if problem  Alaiyna has very severe immune dysregulation with an atopic phenotype and significant inflammation of her entire respiratory tract with the development of nasal polyposis and uncontrolled asthma in the context of having very impressive eosinophilia and lymphocytosis and basophilia.  We need to rule out the possibility of Eosinophilic Granulomatosis with PolyAngiitis (EGPA).  I have started her on an anti-IL-5 biologic agent today and she will need to remain on systemic steroids until we see any significant improvement in her airflows.  She also appears to have an issue with oral allergy syndrome and I provided her  an EpiPen.  I will see her back  in this clinic in 2 weeks or earlier if there is a problem.  Jessica Priest, MD Allergy / Immunology [facility]

## 2021-01-30 ENCOUNTER — Encounter: Payer: Self-pay | Admitting: Allergy and Immunology

## 2021-01-30 ENCOUNTER — Telehealth: Payer: Self-pay | Admitting: *Deleted

## 2021-01-30 NOTE — Addendum Note (Signed)
Addended by: Robet Leu A on: 01/30/2021 02:53 PM   Modules accepted: Orders

## 2021-01-30 NOTE — Telephone Encounter (Signed)
Called patient and advised approval, copay card, delivery and storage and dosing schedule along with bringing in Pen for next appt for self admin instrux

## 2021-01-31 LAB — CBC WITH DIFFERENTIAL
Basophils Absolute: 0.1 10*3/uL (ref 0.0–0.2)
Basos: 0 %
EOS (ABSOLUTE): 0.8 10*3/uL — ABNORMAL HIGH (ref 0.0–0.4)
Eos: 7 %
Hematocrit: 38.7 % (ref 34.0–46.6)
Hemoglobin: 12.2 g/dL (ref 11.1–15.9)
Immature Grans (Abs): 0 10*3/uL (ref 0.0–0.1)
Immature Granulocytes: 0 %
Lymphocytes Absolute: 3 10*3/uL (ref 0.7–3.1)
Lymphs: 26 %
MCH: 24.6 pg — ABNORMAL LOW (ref 26.6–33.0)
MCHC: 31.5 g/dL (ref 31.5–35.7)
MCV: 78 fL — ABNORMAL LOW (ref 79–97)
Monocytes Absolute: 0.8 10*3/uL (ref 0.1–0.9)
Monocytes: 7 %
Neutrophils Absolute: 6.7 10*3/uL (ref 1.4–7.0)
Neutrophils: 60 %
RBC: 4.96 x10E6/uL (ref 3.77–5.28)
RDW: 14.6 % (ref 11.7–15.4)
WBC: 11.3 10*3/uL — ABNORMAL HIGH (ref 3.4–10.8)

## 2021-01-31 LAB — IGE: IgE (Immunoglobulin E), Serum: 188 IU/mL (ref 6–495)

## 2021-02-08 LAB — ANCA PROFILE (RDL)
ANCA by IFA (RDL): NEGATIVE
Anti-MPO Ab (RDL): <20 Units (ref <20)
Anti-PR-3 Ab (RDL): <20 Units (ref <20)

## 2021-02-08 LAB — SPECIMEN STATUS REPORT

## 2021-02-13 NOTE — Patient Instructions (Addendum)
°  1.  Allergen avoidance measures - dust mite, cat, dog, pollen, alternaria mold  2.  Treat and prevent inflammation:  A. Breztri - 2 inhalations 2 times per day w/ spacer (empty lungs) B. Ryaltris - 2 sprays each nostril 2 times per day (specialty pharmacy) C. Montelukast 10 mg - 1 tablet 1 time per day D. Benralizumab injection today and every 4 weeks    3.  If needed:  A. Albuterol HFA - 2 inhalations every 4-6 hours  B. Cetirizine 10 mg - 1 tablet 1 time per day C. Epi-Pen, benadryl, MD/ER evaluation for allergic reaction  . Return to clinic in 2-4  weeks or earlier if problem

## 2021-02-14 ENCOUNTER — Ambulatory Visit: Payer: BC Managed Care – PPO | Admitting: Family

## 2021-02-14 ENCOUNTER — Other Ambulatory Visit: Payer: Self-pay

## 2021-02-14 ENCOUNTER — Encounter: Payer: Self-pay | Admitting: Family

## 2021-02-14 VITALS — BP 100/70 | HR 91 | Temp 97.1°F | Resp 16 | Ht 62.0 in | Wt 162.6 lb

## 2021-02-14 DIAGNOSIS — T7819XA Other adverse food reactions, not elsewhere classified, initial encounter: Secondary | ICD-10-CM

## 2021-02-14 DIAGNOSIS — J339 Nasal polyp, unspecified: Secondary | ICD-10-CM | POA: Diagnosis not present

## 2021-02-14 DIAGNOSIS — D72820 Lymphocytosis (symptomatic): Secondary | ICD-10-CM

## 2021-02-14 DIAGNOSIS — J301 Allergic rhinitis due to pollen: Secondary | ICD-10-CM

## 2021-02-14 DIAGNOSIS — J4550 Severe persistent asthma, uncomplicated: Secondary | ICD-10-CM

## 2021-02-14 DIAGNOSIS — D7219 Other eosinophilia: Secondary | ICD-10-CM

## 2021-02-14 DIAGNOSIS — J3089 Other allergic rhinitis: Secondary | ICD-10-CM | POA: Diagnosis not present

## 2021-02-14 DIAGNOSIS — D72824 Basophilia: Secondary | ICD-10-CM

## 2021-02-14 DIAGNOSIS — T781XXA Other adverse food reactions, not elsewhere classified, initial encounter: Secondary | ICD-10-CM

## 2021-02-14 MED ORDER — ALBUTEROL SULFATE HFA 108 (90 BASE) MCG/ACT IN AERS: 2.0000 | INHALATION_SPRAY | Freq: Four times a day (QID) | RESPIRATORY_TRACT | 1 refills | Status: DC | PRN

## 2021-02-14 NOTE — Progress Notes (Signed)
[address] [place] [address] Dept: [phone]  FOLLOW UP NOTE  Patient ID: Brittany Simon, female    DOB: 1995/02/20  Age: 26 y.o. MRN: [id number] Date of Office Visit: 02/14/2021  Assessment  Chief Complaint: Asthma (2 wk f/u - fine; much better)  HPI Brittany ChaletBlake S Kurdziel is a 26 year old female who presents today for follow-up of not well controlled severe persistent asthma, eosinophilic granulomatosis with polyangiitis, perennial and seasonal allergic rhinitis, nasal polyposis, pollen food allergy, eosinophilia, lymphocytosis, and basophilia.  She was last seen on January 29, 2021 by Dr. Lucie LeatherKozlow.  Not well controlled severe persistent asthma is reported as significantly better with Breztri 2 puffs 2 times a day with spacer, Singulair 10 mg once a day on most days, albuterol as needed, and Fasenra injections every 4 weeks x 3 then every 8 weeks.  She reports that she finished her course of prednisone 10 mg once a day 2 days ago, on Tuesday.  She has had 2 times since her last office visit where she has had cough and wheeze otherwise she denies tightness in her chest, shortness of breath, and nocturnal awakenings due to breathing problems.  She has not made any trips to the emergency room or urgent care due to breathing problems.  She used her albuterol once last week and that it.  She did mention that she forgot to take Singulair a few times, but she will remember to take this daily.  Perennial and seasonal allergic rhinitis is reported as doing better with Ryaltris 2 sprays each nostril twice a day, Singulair 10 mg once a day on most days, and cetirizine as needed.  She has not needed to take an antihistamine since we last saw her.  She reports that she blows her nose now 3 times a day as to where it used to be all that she was having to blow her nose.  She also reports nasal congestion.  She denies rhinorrhea, sinus tenderness, and sinus pressure.  Normally when she blows her nose it  is clear but today she got thick yellow nasal drainage.  She has not had any sinus infections since we last saw her.  She reports that her sense of smell has returned, but it is not the best.  She continues to avoid carrots, certain fruits such as nectarines and certain apples.  She reports that carrots because her mouth, throat, and nose to itch.  Nectarines cause hives, and certain apples cause itching.  She has not had to use her EpiPen since we last saw her.   Drug Allergies:  No Known Allergies  Review of Systems: Review of Systems  Constitutional:  Negative for chills and fever.  HENT:         Reports that she is blowing her nose but a lot less than what she used to.  Denies rhinorrhea, sinus tenderness, and sinus pressure.  Reports nasal congestion.  Eyes:        Denies itchy watery eyes  Respiratory:  Positive for cough and wheezing. Negative for shortness of breath.        Reports coughing and wheezing 2 times since her last office visit.  Denies tightness in chest, shortness of breath, and nocturnal awakenings due to breathing problems  Cardiovascular:  Negative for chest pain and palpitations.  Gastrointestinal:        Denies heartburn and reflux symptoms  Genitourinary:  Positive for frequency.       Reports increased frequency of  urination since drinking more water at night while taking her prednisone and montelukast 10 mg  Skin:  Negative for itching and rash.  Neurological:  Negative for headaches.  Endo/Heme/Allergies:  Positive for environmental allergies.    Physical Exam: BP 100/70    Pulse 91    Temp (!) 97.1 F (36.2 C)    Resp 16    Ht 5\' 2"  (1.575 m)    Wt 162 lb 9.6 oz (73.8 kg)    SpO2 100%    BMI 29.74 kg/m    Physical Exam Constitutional:      Appearance: Normal appearance.  HENT:     Head: Normocephalic and atraumatic.     Comments: Pharynx normal. Eyes normal. Ears normal. Nose: bilateral lower turbinates moderately edematous and pale with clear  drainage noted.    Right Ear: Tympanic membrane, ear canal and external ear normal.     Left Ear: Tympanic membrane, ear canal and external ear normal.     Mouth/Throat:     Mouth: Mucous membranes are moist.     Pharynx: Oropharynx is clear.  Eyes:     Conjunctiva/sclera: Conjunctivae normal.  Cardiovascular:     Rate and Rhythm: Regular rhythm.     Heart sounds: Normal heart sounds.  Pulmonary:     Effort: Pulmonary effort is normal.     Breath sounds: Normal breath sounds.     Comments: Lungs clear to auscultation Musculoskeletal:     Cervical back: Neck supple.  Skin:    General: Skin is warm.  Neurological:     Mental Status: She is alert and oriented to person, place, and time.  Psychiatric:        Mood and Affect: Mood normal.        Behavior: Behavior normal.        Thought Content: Thought content normal.        Judgment: Judgment normal.    Diagnostics: FVC 2.75 L, FEV1 1.77 L (67%).  Predicted FVC 3.04 L, predicted FEV1 2.64 L.  Spirometry indicates possible moderate obstruction.  This is an improvement from previous spirometry.  Component Ref Range & Units 2 wk ago   ANCA by IFA (RDL) Negative Negative   Anti-MPO Ab (RDL) <20 Units <20   Anti-PR-3 Ab (RDL) <20 Units <20    Assessment and Plan: 1. Severe persistent asthma without complication   2. Perennial allergic rhinitis   3. Seasonal allergic rhinitis due to pollen   4. Pollen-food allergy, initial encounter   5. Nasal polyposis   6. Other eosinophilia   7. Lymphocytosis   8. Basophilia     Meds ordered this encounter  Medications   albuterol (PROAIR HFA) 108 (90 Base) MCG/ACT inhaler    Sig: Inhale 2 puffs into the lungs every 6 (six) hours as needed for wheezing.    Dispense:  20.1 g    Refill:  1    Patient Instructions   1.  Allergen avoidance measures - dust mite, cat, dog, pollen, alternaria mold  2.  Treat and prevent inflammation:  A. Breztri - 2 inhalations 2 times per day w/  spacer (empty lungs) B. Ryaltris - 2 sprays each nostril 2 times per day (specialty pharmacy) C. Montelukast 10 mg - 1 tablet 1 time per day D. Benralizumab injection today and every 4 weeks    3.  If needed:  A. Albuterol HFA - 2 inhalations every 4-6 hours  B. Cetirizine 10 mg - 1 tablet 1 time per  day C. Epi-Pen, benadryl, MD/ER evaluation for allergic reaction  . Return to clinic in 2-4  weeks or earlier if problem Return in about 4 weeks (around 03/14/2021), or if symptoms worsen or fail to improve.    Thank you for the opportunity to care for this patient.  Please do not hesitate to contact me with questions.  Nehemiah Settle, FNP [facility]

## 2021-02-19 LAB — ASPERGILLUS IGE PANEL
A. Amstel/Glaucu Class Interp: 0
A. Flavus Class Interp: 0
A. Nidulans Class Interp: 0
A. Niger Class Interp: 0
A. Versicolor Class Interp: 0
Aspergillus amstel/glaucu IgE*: <0.35 kU/L (ref <0.35)
Aspergillus flavus IgE: <0.35 kU/L (ref <0.35)
Aspergillus fumigatus IgE: 0.3 kU/L (ref <0.35)
Aspergillus nidulans IgE: <0.35 kU/L (ref <0.35)
Aspergillus niger IgE: <0.1 kU/L (ref <0.35)
Aspergillus versicolor IgE: <0.1 kU/L (ref <0.35)

## 2021-02-19 LAB — HYPERSENSITIVITY PNEUMONITIS
A. Pullulans Abs: NEGATIVE
A.Fumigatus #1 Abs: NEGATIVE
Micropolyspora faeni, IgG: NEGATIVE
Pigeon Serum Abs: NEGATIVE
Thermoact. Saccharii: NEGATIVE
Thermoactinomyces vulgaris, IgG: NEGATIVE

## 2021-02-19 LAB — ALPHA-1-ANTITRYPSIN: A-1 Antitrypsin: 157 mg/dL (ref 100–188)

## 2021-02-19 LAB — ASPERGILLUS FUMIGATUS IGG: Aspergillus fumigatus IgG: 78.9 mg/L (ref 0.0–81.5)

## 2021-02-19 NOTE — Progress Notes (Signed)
Please let Brittany Simon know that her lab work for asthma was normal. Continue your plan as discussed at the last office visit.

## 2021-02-25 ENCOUNTER — Other Ambulatory Visit (INDEPENDENT_AMBULATORY_CARE_PROVIDER_SITE_OTHER): Payer: BC Managed Care – PPO

## 2021-02-25 DIAGNOSIS — D649 Anemia, unspecified: Secondary | ICD-10-CM | POA: Diagnosis not present

## 2021-02-25 DIAGNOSIS — Z09 Encounter for follow-up examination after completed treatment for conditions other than malignant neoplasm: Secondary | ICD-10-CM

## 2021-02-25 DIAGNOSIS — R739 Hyperglycemia, unspecified: Secondary | ICD-10-CM

## 2021-02-25 DIAGNOSIS — J302 Other seasonal allergic rhinitis: Secondary | ICD-10-CM

## 2021-02-25 DIAGNOSIS — E611 Iron deficiency: Secondary | ICD-10-CM | POA: Diagnosis not present

## 2021-02-25 DIAGNOSIS — J45909 Unspecified asthma, uncomplicated: Secondary | ICD-10-CM

## 2021-02-25 DIAGNOSIS — Z79899 Other long term (current) drug therapy: Secondary | ICD-10-CM

## 2021-02-26 LAB — HEMOGLOBIN A1C: Hgb A1c MFr Bld: 5.9 % (ref 4.6–6.5)

## 2021-02-26 LAB — CBC WITH DIFFERENTIAL/PLATELET
Basophils Absolute: 0.1 10*3/uL (ref 0.0–0.1)
Basophils Relative: 0.8 % (ref 0.0–3.0)
Eosinophils Absolute: 0 10*3/uL (ref 0.0–0.7)
Eosinophils Relative: 0 % (ref 0.0–5.0)
HCT: 34.9 % — ABNORMAL LOW (ref 36.0–46.0)
Hemoglobin: 11.2 g/dL — ABNORMAL LOW (ref 12.0–15.0)
Lymphocytes Relative: 20.1 % (ref 12.0–46.0)
Lymphs Abs: 2.5 10*3/uL (ref 0.7–4.0)
MCHC: 32.1 g/dL (ref 30.0–36.0)
MCV: 77.3 fl — ABNORMAL LOW (ref 78.0–100.0)
Monocytes Absolute: 0.8 10*3/uL (ref 0.1–1.0)
Monocytes Relative: 6 % (ref 3.0–12.0)
Neutro Abs: 9.1 10*3/uL — ABNORMAL HIGH (ref 1.4–7.7)
Neutrophils Relative %: 73.1 % (ref 43.0–77.0)
Platelets: 303 10*3/uL (ref 150.0–400.0)
RBC: 4.52 Mil/uL (ref 3.87–5.11)
RDW: 16.2 % — ABNORMAL HIGH (ref 11.5–15.5)
WBC: 12.5 10*3/uL — ABNORMAL HIGH (ref 4.0–10.5)

## 2021-02-26 LAB — IBC PANEL
Iron: 20 ug/dL — ABNORMAL LOW (ref 42–145)
Saturation Ratios: 4.9 % — ABNORMAL LOW (ref 20.0–50.0)
TIBC: 407.4 ug/dL (ref 250.0–450.0)
Transferrin: 291 mg/dL (ref 212.0–360.0)

## 2021-02-26 LAB — BASIC METABOLIC PANEL
BUN: 10 mg/dL (ref 6–23)
CO2: 26 mEq/L (ref 19–32)
Calcium: 9.2 mg/dL (ref 8.4–10.5)
Chloride: 103 mEq/L (ref 96–112)
Creatinine, Ser: 0.48 mg/dL (ref 0.40–1.20)
GFR: 131.57 mL/min (ref >60.00)
Glucose, Bld: 89 mg/dL (ref 70–99)
Potassium: 3.6 mEq/L (ref 3.5–5.1)
Sodium: 137 mEq/L (ref 135–145)

## 2021-02-28 ENCOUNTER — Ambulatory Visit: Payer: BC Managed Care – PPO

## 2021-03-05 ENCOUNTER — Emergency Department (HOSPITAL_BASED_OUTPATIENT_CLINIC_OR_DEPARTMENT_OTHER)
Admission: EM | Admit: 2021-03-05 | Discharge: 2021-03-05 | Disposition: A | Discharge status: Home / Self Care | Payer: BC Managed Care – PPO | Attending: Emergency Medicine | Admitting: Emergency Medicine

## 2021-03-05 ENCOUNTER — Other Ambulatory Visit: Payer: Self-pay

## 2021-03-05 ENCOUNTER — Encounter (HOSPITAL_BASED_OUTPATIENT_CLINIC_OR_DEPARTMENT_OTHER): Payer: Self-pay | Admitting: Urology

## 2021-03-05 ENCOUNTER — Emergency Department (HOSPITAL_BASED_OUTPATIENT_CLINIC_OR_DEPARTMENT_OTHER): Payer: BC Managed Care – PPO | Admitting: Radiology

## 2021-03-05 DIAGNOSIS — Z20822 Contact with and (suspected) exposure to covid-19: Secondary | ICD-10-CM | POA: Diagnosis not present

## 2021-03-05 DIAGNOSIS — T782XXA Anaphylactic shock, unspecified, initial encounter: Secondary | ICD-10-CM | POA: Diagnosis not present

## 2021-03-05 DIAGNOSIS — T7840XA Allergy, unspecified, initial encounter: Secondary | ICD-10-CM | POA: Diagnosis present

## 2021-03-05 DIAGNOSIS — J45909 Unspecified asthma, uncomplicated: Secondary | ICD-10-CM | POA: Diagnosis not present

## 2021-03-05 LAB — RESP PANEL BY RT-PCR (FLU A&B, COVID) ARPGX2
Influenza A by PCR: NEGATIVE
Influenza B by PCR: NEGATIVE
SARS Coronavirus 2 by RT PCR: NEGATIVE

## 2021-03-05 MED ORDER — FAMOTIDINE IN NACL 20-0.9 MG/50ML-% IV SOLN
20.0000 mg | Freq: Once | INTRAVENOUS | Status: AC
  Administered 2021-03-05: 20 mg via INTRAVENOUS
  Filled 2021-03-05: qty 50

## 2021-03-05 MED ORDER — METHYLPREDNISOLONE SODIUM SUCC 125 MG IJ SOLR
125.0000 mg | Freq: Once | INTRAMUSCULAR | Status: AC
  Administered 2021-03-05: 125 mg via INTRAVENOUS
  Filled 2021-03-05: qty 2

## 2021-03-05 MED ORDER — EPINEPHRINE 0.3 MG/0.3ML IJ SOAJ
0.3000 mg | Freq: Once | INTRAMUSCULAR | Status: AC
  Administered 2021-03-05: 0.3 mg via INTRAMUSCULAR
  Filled 2021-03-05: qty 0.3

## 2021-03-05 MED ORDER — DIPHENHYDRAMINE HCL 50 MG/ML IJ SOLN
25.0000 mg | Freq: Once | INTRAMUSCULAR | Status: AC
  Administered 2021-03-05: 25 mg via INTRAVENOUS
  Filled 2021-03-05: qty 1

## 2021-03-05 MED ORDER — PREDNISONE 20 MG PO TABS
40.0000 mg | ORAL_TABLET | Freq: Every day | ORAL | 0 refills | Status: AC
Start: 2021-03-06 — End: 2021-03-11

## 2021-03-05 MED ORDER — SODIUM CHLORIDE 0.9 % IV BOLUS
1000.0000 mL | Freq: Once | INTRAVENOUS | Status: AC
  Administered 2021-03-05: 1000 mL via INTRAVENOUS

## 2021-03-05 MED ORDER — SODIUM CHLORIDE 0.9 % IV SOLN: INTRAVENOUS | Status: DC

## 2021-03-05 NOTE — ED Provider Notes (Signed)
[facility] EMERGENCY DEPT Provider Note   CSN: [id number] Arrival date & time: 03/05/21  1756     History  Chief Complaint  Patient presents with   Wheezing   Allergic Reaction    Brittany Simon is a 26 y.o. female.  The history is provided by the patient and medical records. No language interpreter was used.  Allergic Reaction Presenting symptoms: difficulty breathing, difficulty swallowing, itching and wheezing   Presenting symptoms: no rash   Severity:  Severe Relieved by:  Nothing Worsened by:  Nothing Ineffective treatments:  None tried     Home Medications Prior to Admission medications   Medication Sig Start Date End Date Taking? Authorizing Provider  albuterol (PROAIR HFA) 108 (90 Base) MCG/ACT inhaler Inhale 2 puffs into the lungs every 6 (six) hours as needed for wheezing. 02/14/21   Nehemiah Settle, FNP  albuterol (PROVENTIL) (2.5 MG/3ML) 0.083% nebulizer solution Take 3 mLs (2.5 mg total) by nebulization every 4 (four) hours as needed for wheezing or shortness of breath. 01/11/21 01/11/22  [place], Alvira Philips, DO  betamethasone dipropionate 0.05 % lotion SMARTSIG:Sparingly Topical 05/16/19   [provider]  Budeson-Glycopyrrol-Formoterol (BREZTRI AEROSPHERE) 160-9-4.8 MCG/ACT AERO Inhale 2 puffs into the lungs in the morning and at bedtime. 01/29/21   Kozlow, Alvira Philips, MD  cetirizine (ZYRTEC) 10 MG tablet Take 1 tablet (10 mg total) by mouth 2 (two) times daily as needed for allergies (Use an extra dose during flare ups.). 01/29/21   Kozlow, Alvira Philips, MD  EPINEPHrine 0.3 mg/0.3 mL IJ SOAJ injection Inject 0.3 mg into the muscle as directed. 01/29/21   [provider]  FASENRA PEN 30 MG/ML SOAJ Inject 30 mg into the skin every 30 (thirty) days. 02/07/21   [provider]  fexofenadine (ALLEGRA ALLERGY) 180 MG tablet Take 1 tablet (180 mg total) by mouth daily. 10/31/20   Nelwyn Salisbury, MD  fluticasone Aleda Grana) 50 MCG/ACT nasal spray 2  spray each nostril qd 10/31/20   Nelwyn Salisbury, MD  fluticasone furoate-vilanterol (BREO ELLIPTA) 100-25 MCG/INH AEPB Inhale 1 puff into the lungs daily. For asthma 05/16/20   Panosh, Neta Mends, MD  montelukast (SINGULAIR) 10 MG tablet Take 1 tablet (10 mg total) by mouth at bedtime. 10/31/20   Nelwyn Salisbury, MD  Olopatadine-Mometasone Cristal Generous) [phone] MCG/ACT SUSP Place 2 sprays into the nose 2 (two) times daily. 01/29/21   Kozlow, Alvira Philips, MD      Allergies    Patient has no known allergies.    Review of Systems   Review of Systems  Constitutional:  Negative for chills, fatigue and fever.  HENT:  Positive for congestion and trouble swallowing.   Respiratory:  Positive for cough, chest tightness, shortness of breath and wheezing.   Cardiovascular:  Negative for chest pain and palpitations.  Gastrointestinal:  Positive for abdominal pain and nausea. Negative for abdominal distention, constipation, diarrhea and vomiting.  Genitourinary:  Negative for dysuria and flank pain.  Musculoskeletal:  Negative for back pain, neck pain and neck stiffness.  Skin:  Positive for itching. Negative for rash and wound.  Neurological:  Negative for dizziness, weakness, light-headedness and headaches.  Psychiatric/Behavioral:  Negative for agitation and confusion.    Physical Exam Updated Vital Signs BP 104/82 (BP Location: Right Arm)    Pulse (!) 140    Temp 98.1 F (36.7 C) (Oral)    Resp (!) 22    Ht 5\' 2"  (1.575 m)    Wt 73.8 kg  LMP 03/05/2021 (Exact Date)    SpO2 91%    BMI 29.74 kg/m  Physical Exam Vitals and nursing note reviewed.  Constitutional:      General: She is not in acute distress.    Appearance: She is well-developed. She is not ill-appearing, toxic-appearing or diaphoretic.  HENT:     Head: Normocephalic and atraumatic.     Nose: Congestion and rhinorrhea present.     Mouth/Throat:     Mouth: Mucous membranes are moist.     Pharynx: No oropharyngeal exudate or posterior  oropharyngeal erythema.  Eyes:     Extraocular Movements: Extraocular movements intact.     Conjunctiva/sclera: Conjunctivae normal.     Pupils: Pupils are equal, round, and reactive to light.  Cardiovascular:     Rate and Rhythm: Normal rate and regular rhythm.     Heart sounds: No murmur heard. Pulmonary:     Effort: Respiratory distress present.     Breath sounds: No stridor. Wheezing present. No rhonchi or rales.  Chest:     Chest wall: No tenderness.  Abdominal:     General: Abdomen is flat.     Palpations: Abdomen is soft.     Tenderness: There is no abdominal tenderness. There is no right CVA tenderness, left CVA tenderness, guarding or rebound.  Musculoskeletal:        General: No swelling or tenderness.     Cervical back: Neck supple.  Skin:    General: Skin is warm and dry.     Capillary Refill: Capillary refill takes less than 2 seconds.     Findings: No erythema or rash.  Neurological:     General: No focal deficit present.     Mental Status: She is alert.  Psychiatric:        Mood and Affect: Mood normal.    ED Results / Procedures / Treatments   Labs (all labs ordered are listed, but only abnormal results are displayed) Labs Reviewed  RESP PANEL BY RT-PCR (FLU A&B, COVID) ARPGX2  PREGNANCY, URINE    EKG None  Radiology DG Chest 2 View  Result Date: 03/05/2021 CLINICAL DATA:  allergic reaction, also URI for a week EXAM: CHEST - 2 VIEW COMPARISON:  Chest x-ray 01/09/2021 FINDINGS: The heart and mediastinal contours are within normal limits. No focal consolidation. No pulmonary edema. No pleural effusion. No pneumothorax. No acute osseous abnormality. IMPRESSION: No active cardiopulmonary disease. Electronically Signed   By: Tish FredericksonMorgane  Naveau M.D.   On: 03/05/2021 18:44    Procedures Procedures    CRITICAL CARE Performed by: Canary Brimhristopher J Lisaanne Lawrie Total critical care time: 35 minutes Critical care time was exclusive of separately billable procedures and  treating other patients. Critical care was necessary to treat or prevent imminent or life-threatening deterioration. Critical care was time spent personally by me on the following activities: development of treatment plan with patient and/or surrogate as well as nursing, discussions with consultants, evaluation of patient's response to treatment, examination of patient, obtaining history from patient or surrogate, ordering and performing treatments and interventions, ordering and review of laboratory studies, ordering and review of radiographic studies, pulse oximetry and re-evaluation of patient's condition.   Medications Ordered in ED Medications  sodium chloride 0.9 % bolus 1,000 mL (0 mLs Intravenous Stopped 03/05/21 2052)    And  0.9 %  sodium chloride infusion (has no administration in time range)  diphenhydrAMINE (BENADRYL) injection 25 mg (25 mg Intravenous Given 03/05/21 1827)  EPINEPHrine (EPI-PEN) injection 0.3 mg (0.3  mg Intramuscular Given 03/05/21 1827)  methylPREDNISolone sodium succinate (SOLU-MEDROL) 125 mg/2 mL injection 125 mg (125 mg Intravenous Given 03/05/21 1827)  famotidine (PEPCID) IVPB 20 mg premix (0 mg Intravenous Stopped 03/05/21 2052)    ED Course/ Medical Decision Making/ A&P                           Medical Decision Making Amount and/or Complexity of Data Reviewed Labs: ordered. Radiology: ordered.  Risk Prescription drug management.   Brittany Simon is a 26 y.o. female with a past medical history significant for asthma who presents with concern for allergic reaction.  According to patient, she had some Cheerios cereal followed by nerds candy about an hour ago and shortly thereafter started feeling diffuse pruritus, nausea, lightheadedness, wheezing, short of breath, and feeling like her throat was closing.  Patient quickly came to the emergency department for evaluation.  She does say she has a prescription for epinephrine but she did not use it and instead  came for evaluation.  She denies any choking and denies any chest pain.  She reports that she is unsure of any sick contacts.  She says that she has not had any wheezing troubles in over a month and has used albuterol prior to arriving with a short of breath and wheezing.  She has never had a reaction like this in the past.  I was asked see the patient in triage for possible anaphylaxis.  On my exam, I did not hear stridor but patient felt that everything was closing in her throat.  Oropharyngeal exam otherwise unremarkable.  Lungs had wheezing diffusely.  Chest and abdomen were nontender.  No focal neurologic deficits.  No rash seen but patient was itching.  Patient had some tachycardia, pressures were on the softer side, and oxygen saturation was around 91% on room air.  Clinically I am concerned about anaphylaxis.  Decision made to treat with epinephrine, steroids, and antihistamines.  We will also get chest x-ray with the oxygen saturation in the low 90s and check for COVID as well.  After intervention, patient felt much better.  She will be watched for several hours to make sure there is no recurrent reaction.  After 5 hours, patient has had complete resolution of symptoms with no recurrence.  Patient had no wheezing on lung exam.  Patient was given burst of steroids for the neck several days and she reports he has a new epinephrine prescription at home which she is filled.  She was instructed on using it and will use Benadryl for any itching.  She will follow-up with her PCP and allergist for further testing.  She will avoid Cheerios and the nerves which she ingested before the reaction.  She understands return precautions and follow-up instructions and was discharged in good condition with resolution of symptoms.          Final Clinical Impression(s) / ED Diagnoses Final diagnoses:  Anaphylaxis, initial encounter    Rx / DC Orders ED Discharge Orders          Ordered     predniSONE (DELTASONE) 20 MG tablet  Daily        03/05/21 2343            Clinical Impression: 1. Anaphylaxis, initial encounter     Disposition: Discharge  Condition: Good  I have discussed the results, Dx and Tx plan with the pt(& family if present). He/she/they expressed understanding and agree(s)  with the plan. Discharge instructions discussed at great length. Strict return precautions discussed and pt &/or family have verbalized understanding of the instructions. No further questions at time of discharge.    New Prescriptions   PREDNISONE (DELTASONE) 20 MG TABLET    Take 2 tablets (40 mg total) by mouth daily for 5 days.    Follow Up: Madelin HeadingsPanosh, Wanda K, MD [address] [place] [address] [phone]     [facility] [address] [place] [phone]       Westyn Keatley, Canary Brimhristopher J, MD 03/05/21 [phone]

## 2021-03-05 NOTE — ED Notes (Signed)
Respiratory at bedside due to wheezing and spo2

## 2021-03-05 NOTE — ED Notes (Signed)
Patient airway patent and no concerns for airway compromise.

## 2021-03-05 NOTE — ED Triage Notes (Signed)
Pt ate cheerios prior to arrival, states felt like throat was closing but it is resolving now Complains of itching to extremities  Currently wheezing H/o asthma  3-4 puffs of albuterol inhaler in past 20 min.

## 2021-03-05 NOTE — ED Notes (Signed)
ED Provider at bedside. 

## 2021-03-05 NOTE — Discharge Instructions (Signed)
Your history, exam, work-up today are consistent with anaphylactic reaction to something.  You had multiple organ systems involvement including itching of the skin, nausea, shortness of breath, wheezing, and feeling like her throat was closing with difficulty swallowing.  The symptoms are improved after the steroids, antihistamines, and epinephrine.  Please keep your epinephrine with you and take the steroids for the next 5 days starting tomorrow.  Please follow-up with your PCP and allergist for further evaluation.  Please rest and stay hydrated.  If any symptoms change or worsen, please return to the nearest emergency department.

## 2021-03-06 ENCOUNTER — Telehealth: Payer: Self-pay | Admitting: *Deleted

## 2021-03-06 NOTE — Telephone Encounter (Signed)
Called and informed patient of Dr. Bruna Potter recommendation. Patient verbalized understanding.

## 2021-03-06 NOTE — Telephone Encounter (Signed)
Patient called and stated that she had an anaphylactic reaction yesterday resulting in her going to the ER and being administered Epi and other medications. She states that she believes that it was something she ate. She states that she feels fine today just tired. She is scheduled to receive her Harrington Challenger tomorrow and was wondering if it was still ok to get her Harrington Challenger, and if she should schedule to be seen sooner following her reaction.

## 2021-03-07 ENCOUNTER — Other Ambulatory Visit: Payer: Self-pay

## 2021-03-07 ENCOUNTER — Ambulatory Visit (INDEPENDENT_AMBULATORY_CARE_PROVIDER_SITE_OTHER): Payer: BC Managed Care – PPO

## 2021-03-07 DIAGNOSIS — J4550 Severe persistent asthma, uncomplicated: Secondary | ICD-10-CM

## 2021-03-07 NOTE — Progress Notes (Signed)
Patient cam in today to practice her Brittany Simon administration. Patient was taught how to administer the injections in her thighs, and abdomen. Patient administered her injection in her right upper thigh. Patient waited in the lobby for twenty minutes without an issue or problem. Patient was informed to contact our office with any issue or concern.

## 2021-03-10 NOTE — Progress Notes (Signed)
Kidney blood sugar normal   iron level still somewhat low  mild anemia   WBC is still mildly elevated but  could be from asthma treatment . Make sure taking extra iron  Advise cbcdiff and ibc ferritin in about 6 months  ( dx iron deficiency )

## 2021-03-16 NOTE — Patient Instructions (Addendum)
°  1.  Allergen avoidance measures - dust mite, cat, dog, pollen, alternaria mold  2.  Treat and prevent inflammation:  A. Breztri - 2 inhalations 2 times per day w/ spacer (empty lungs) B. Ryaltris - 2 sprays each nostril 2 times per day (specialty pharmacy) C. Montelukast 10 mg - 1 tablet 1 time per day D. Benralizumab injection every 4 weeks for a total of 3 doses- then every 8 weeks. Consider changing to [place]. Information given.    3.  If needed:  A. Albuterol HFA - 2 inhalations every 4-6 hours  B. Cetirizine 10 mg - 1 tablet 1 time per day C. Epi-Pen, benadryl, MD/ER evaluation for allergic reaction  4. We will get some lab work to follow up on your anaphylactic reaction that you had on 03/05/21. We will call you with results once they are back. For now continue to avoid almond milk -almonds and honey nut cheerios  If your symptoms re-occur, begin a journal of events that occurred for up to 6 hours before your symptoms began including foods and beverages consumed, soaps or perfumes you had contact with, and medications.     Return to clinic in  6 weeks with Dr. Neldon Mc or earlier if problem

## 2021-03-18 ENCOUNTER — Other Ambulatory Visit: Payer: Self-pay

## 2021-03-18 ENCOUNTER — Encounter: Payer: Self-pay | Admitting: Family

## 2021-03-18 ENCOUNTER — Ambulatory Visit (INDEPENDENT_AMBULATORY_CARE_PROVIDER_SITE_OTHER): Payer: BC Managed Care – PPO | Admitting: Family

## 2021-03-18 VITALS — BP 120/84 | HR 90 | Temp 97.2°F | Resp 18 | Ht 62.0 in | Wt 166.8 lb

## 2021-03-18 DIAGNOSIS — D72824 Basophilia: Secondary | ICD-10-CM

## 2021-03-18 DIAGNOSIS — J339 Nasal polyp, unspecified: Secondary | ICD-10-CM

## 2021-03-18 DIAGNOSIS — J4550 Severe persistent asthma, uncomplicated: Secondary | ICD-10-CM

## 2021-03-18 DIAGNOSIS — D7219 Other eosinophilia: Secondary | ICD-10-CM | POA: Diagnosis not present

## 2021-03-18 DIAGNOSIS — T782XXD Anaphylactic shock, unspecified, subsequent encounter: Secondary | ICD-10-CM

## 2021-03-18 DIAGNOSIS — J301 Allergic rhinitis due to pollen: Secondary | ICD-10-CM

## 2021-03-18 DIAGNOSIS — D72820 Lymphocytosis (symptomatic): Secondary | ICD-10-CM

## 2021-03-18 DIAGNOSIS — T781XXA Other adverse food reactions, not elsewhere classified, initial encounter: Secondary | ICD-10-CM

## 2021-03-18 DIAGNOSIS — J3089 Other allergic rhinitis: Secondary | ICD-10-CM | POA: Diagnosis not present

## 2021-03-18 NOTE — Progress Notes (Addendum)
[address]a Presto [place] [address] Dept: [phone]  FOLLOW UP NOTE  Patient ID: Brittany Simon, female    DOB: 20-Jul-1995  Age: 26 y.o. MRN: [id number] Date of Office Visit: 03/18/2021  Assessment  Chief Complaint: Asthma (4 wk f/u - better)  HPI Brittany Simon is a 26 year old female who presents today for follow-up of severe persistent asthma, perennial and seasonal allergic rhinitis, pollen food allergy, nasal polyposis, eosinophilia, lymphocytosis, and basophilia.  She was last seen on February 14, 2021 by Nehemiah Settle, FNP.  Since her last office visit she reports that she went to the emergency room on March 05, 2021 for anaphylactic reaction.  She is unsure of the cause of the anaphylaxis.  Moderate persistent asthma is reported as moderately controlled with Breztri 2 puffs twice a day with spacer, Singulair 10 mg once a day, Fasenra injections every 4 weeks, and albuterol as needed.  She reports over the weekend she had some coughing and some wheezing and her friend felt like she was breathing heavy.  She denies tightness in chest, shortness of breath, and nocturnal awakenings due to breathing problems.  She was given Solu-Medrol while in the emergency room for anaphylactic reaction.  She did take the prednisone that was prescribed for her to start after leaving the emergency room.  She uses her albuterol inhaler once a week if even that.  She denies any problems or reactions with the Fasenra injections.  Her breathing is better since starting her asthma regimen.  She does mention that last week she forgot to take her morning dose of Breztri and by 11:00 she realized she had not taken it.  Perennial and seasonal allergic rhinitis is reported as not well controlled with Ryaltris 2 sprays each nostril twice a day, Singulair 10 mg once a day, and cetirizine as needed.  She reports nasal congestion rhinorrhea that is yellow in the morning and clear the rest of the day and anosmia.   She mentions that the only time she is able to smell is when she is on prednisone.  Wonders what other options she has for her nasal congestion.  She denies postnasal drip.  She has not had any sinus infections since we last saw her.  She continues to avoid carrots, certain fruits such as nectarines and certain apples.  She has not had any accidental ingestion or use of her EpiPen since we last saw her.  She reports that on March 05, 2021 she had an anaphylactic reaction and she is not sure what was the cause.  For lunch that day around 11:30 AM or 12:00 PM she had a [place] and cheese sandwich for lunch, Doritos, and a cupcake.  She has been eating these foods her whole life without any problems.  She has had a sandwich since  without any problems.  Then around 4:30 PM or 5 PM she had a snack of Honey Nut Cheerios with almond milk and Nerds candy.  Approximately 20 minutes later after eating the snacks she developed itchy throat, wheezing, nausea, and diarrhea.  She used her albuterol inhaler and it helped slightly.  She did not use her EpiPen but she did go to the emergency room where she was given normal saline, Benadryl, epinephrine, Solu-Medrol, and Pepcid.  She was prescribed prednisone to take for the next several days, but reports that she did not take the prednisone.  She has not had any Honey Nut Cheerios or Nerd candy since.  She is  not certain if she has had almond milk as the anaphylactic reaction.  She has had peanut butter and peanut M&Ms since without any problems.  She has been drinking oat milk since without any problems.  She has been avoiding other nuts since this anaphylactic reaction.  When asked if she has had wheat she reports that she has had sandwiches and pasta since.  Since her reaction she has also had corn in Doritos chips.   Drug Allergies:  No Known Allergies  Review of Systems: Review of Systems  Constitutional:  Negative for chills and fever.  HENT:         Reports  nasal congestion and rhinorrhea that is yellow in the morning and clear the rest of the day.  Denies postnasal drip.  Eyes:        Denies itchy watery eyes  Respiratory:  Positive for cough and wheezing. Negative for shortness of breath.        Reports some coughing and wheezing over the weekend.  Denies tightness in chest, shortness of breath, and nocturnal awakening  Cardiovascular:  Negative for chest pain and palpitations.  Gastrointestinal:        Denies heartburn or reflux symptoms  Genitourinary:  Negative for frequency.  Skin:  Negative for itching and rash.  Neurological:  Negative for headaches.  Endo/Heme/Allergies:  Positive for environmental allergies.    Physical Exam: BP 120/84    Pulse 90    Temp (!) 97.2 F (36.2 C)    Resp 18    Ht 5\' 2"  (1.575 m)    Wt 166 lb 12.8 oz (75.7 kg)    LMP 03/05/2021 (Exact Date)    SpO2 100%    BMI 30.51 kg/m    Physical Exam Constitutional:      Appearance: Normal appearance.  HENT:     Head: Normocephalic and atraumatic.     Comments: Pharynx normal, eyes normal, ears normal, nose: Bilateral lower turbinates moderately edematous and pale with clear drainage noted    Right Ear: Tympanic membrane, ear canal and external ear normal.     Left Ear: Tympanic membrane, ear canal and external ear normal.     Mouth/Throat:     Mouth: Mucous membranes are moist.     Pharynx: Oropharynx is clear.  Eyes:     Conjunctiva/sclera: Conjunctivae normal.  Cardiovascular:     Rate and Rhythm: Normal rate and regular rhythm.     Heart sounds: Normal heart sounds.  Pulmonary:     Effort: Pulmonary effort is normal.     Breath sounds: Normal breath sounds.     Comments: Lungs clear to auscultation Musculoskeletal:     Cervical back: Neck supple.  Skin:    General: Skin is warm.  Neurological:     Mental Status: She is alert and oriented to person, place, and time.  Psychiatric:        Mood and Affect: Mood normal.        Behavior: Behavior  normal.        Thought Content: Thought content normal.        Judgment: Judgment normal.    Diagnostics: FVC 2.50 L, FEV1 1.94 L (73%).  Predicted FVC 3.04 L, predicted FEV1 2.64 L.  Spirometry indicates normal respiratory function.  Assessment and Plan: 1. Severe persistent asthma without complication   2. Anaphylaxis, subsequent encounter   3. Seasonal allergic rhinitis due to pollen   4. Perennial allergic rhinitis   5. Pollen-food allergy, initial  encounter   6. Nasal polyposis   7. Other eosinophilia   8. Lymphocytosis   9. Basophilia     No orders of the defined types were placed in this encounter.   Patient Instructions   1.  Allergen avoidance measures - dust mite, cat, dog, pollen, alternaria mold  2.  Treat and prevent inflammation:  A. Breztri - 2 inhalations 2 times per day w/ spacer (empty lungs) B. Ryaltris - 2 sprays each nostril 2 times per day (specialty pharmacy) C. Montelukast 10 mg - 1 tablet 1 time per day D. Benralizumab injection every 4 weeks for a total of 3 doses- then every 8 weeks. Consider changing to Dupixent. Information given.    3.  If needed:  A. Albuterol HFA - 2 inhalations every 4-6 hours  B. Cetirizine 10 mg - 1 tablet 1 time per day C. Epi-Pen, benadryl, MD/ER evaluation for allergic reaction  4. We will get some lab work to follow up on your anaphylactic reaction that you had on 03/05/21. We will call you with results once they are back. For now continue to avoid almond milk -almonds and honey nut cheerios  If your symptoms re-occur, begin a journal of events that occurred for up to 6 hours before your symptoms began including foods and beverages consumed, soaps or perfumes you had contact with, and medications.     Return to clinic in  6 weeks with Dr. Lucie Leather or earlier if problem Return in about 6 weeks (around 04/29/2021), or if symptoms worsen or fail to improve.    Thank you for the opportunity to care for this patient.   Please do not hesitate to contact me with questions.  Nehemiah Settle, FNP [facility]

## 2021-03-24 LAB — IGE NUT PROF. W/COMPONENT RFLX

## 2021-03-25 LAB — PEANUT COMPONENTS
F352-IgE Ara h 8: 0.79 kU/L — AB
F422-IgE Ara h 1: <0.1 kU/L
F423-IgE Ara h 2: <0.1 kU/L
F424-IgE Ara h 3: <0.1 kU/L
F427-IgE Ara h 9: <0.1 kU/L
F447-IgE Ara h 6: <0.1 kU/L

## 2021-03-25 LAB — PANEL 604726
Cor A 1 IgE: 4.65 kU/L — AB
Cor A 14 IgE: <0.1 kU/L
Cor A 8 IgE: <0.1 kU/L
Cor A 9 IgE: <0.1 kU/L

## 2021-03-25 LAB — IGE NUT PROF. W/COMPONENT RFLX
F017-IgE Hazelnut (Filbert): 3.54 kU/L — AB
F018-IgE Brazil Nut: <0.1 kU/L
F020-IgE Almond: 0.22 kU/L — AB
F202-IgE Cashew Nut: <0.1 kU/L
F203-IgE Pistachio Nut: 0.2 kU/L — AB
F256-IgE Walnut: <0.1 kU/L
Macadamia Nut, IgE: 0.21 kU/L — AB
Peanut, IgE: 0.12 kU/L — AB
Pecan Nut IgE: <0.1 kU/L

## 2021-03-25 LAB — ALLERGEN COMPONENT COMMENTS

## 2021-03-25 LAB — TRYPTASE: Tryptase: 4 ug/L (ref 2.2–13.2)

## 2021-03-25 NOTE — Progress Notes (Signed)
Please let Brittany Simon know that we received her lab work back.  Peanut was slightly elevated, but since she has eaten this without any problems since the reaction I would like for her to keep this in her diet.  Also, let her know that hazelnut was elevated and macadamia nut, pistachio, and almond were slightly elevated. I would like for her to avoid all tree nuts and have access to epinephrine auto injector. I would also continue to avoid Honey Nut Cheerios.  Her tryptase  ( a marker of mast cell disease) was normal.

## 2021-03-27 NOTE — Progress Notes (Signed)
Thank you :)

## 2021-04-08 ENCOUNTER — Encounter: Payer: Self-pay | Admitting: Internal Medicine

## 2021-04-08 ENCOUNTER — Ambulatory Visit: Payer: BC Managed Care – PPO | Admitting: Internal Medicine

## 2021-04-08 VITALS — BP 106/80 | HR 89 | Temp 98.0°F | Ht 62.0 in | Wt 160.8 lb

## 2021-04-08 DIAGNOSIS — Z87892 Personal history of anaphylaxis: Secondary | ICD-10-CM

## 2021-04-08 DIAGNOSIS — F419 Anxiety disorder, unspecified: Secondary | ICD-10-CM

## 2021-04-08 DIAGNOSIS — E049 Nontoxic goiter, unspecified: Secondary | ICD-10-CM | POA: Diagnosis not present

## 2021-04-08 DIAGNOSIS — F410 Panic disorder [episodic paroxysmal anxiety] without agoraphobia: Secondary | ICD-10-CM

## 2021-04-08 DIAGNOSIS — Z79899 Other long term (current) drug therapy: Secondary | ICD-10-CM | POA: Diagnosis not present

## 2021-04-08 NOTE — Patient Instructions (Addendum)
Agree with counselor  for aggravation of anxiety .  ? ?Will check with your  allergist about adding medication but should not usually interfere with  treatments.  And status.  ? ?Considering  lexapro  low dose 5 mg per day .  ?Plan 3 week  med check after beginning.  Virtual ok . ? ?Let us know in interim   if having new problems or ?s  ? ?We can add thyroid  test with next blood draw.  ? ? ? ? ?

## 2021-04-08 NOTE — Progress Notes (Signed)
? ?Chief Complaint  ?Patient presents with  ? Anxiety  ? ? ?HPI: ?Brittany Simon 25 y.o. come in for  above concern  of increasing anxiety and  a poss panic attack  ? ?On going anxiety  but worse   as an adult .   ?Has bene coping.   ?May have had anxiety attack last week  different  chest tight nausea and sweating.  Shaky. ?Friend noticed more anxiety.  ?One in college.  Otherwise not  ? ?Has had counselor over time but now all telemedicine and looking into in person visits that she does better with  ? ?Has been under  allergy care now  and close fu because of serious asthmatic  allergic flares recently eval and place on  high suppressive rx  ? ?2 drinks a week  neg td inhalants .  ?Sleep: at least 7 hours  ?On feet all day tries to walk kick boxing getting   ?Work.  Some stress  not out of ordinary .  ?? If  worrisome  sensitive  from asthma and allergy.  ?Fam hx of  anxiety.  No meds known  ? ?ROS: See pertinent positives and negatives per HPI. Not really depressed  ? ?Past Medical History:  ?Diagnosis Date  ? Allergy   ? Anemia 02/2018  ? Asthma   ? Prematurity, fetus 35-36 completed weeks of gestation   ?  4 40z 36 weeks  ? ? ?Family History  ?Problem Relation Age of Onset  ? Colon polyps Mother   ? Asthma Father   ? Colon cancer Neg Hx   ? Esophageal cancer Neg Hx   ? Rectal cancer Neg Hx   ? Stomach cancer Neg Hx   ? ? ?Social History  ? ?Socioeconomic History  ? Marital status: Single  ?  Spouse name: Not on file  ? Number of children: 0  ? Years of education: Not on file  ? Highest education level: Not on file  ?Occupational History  ? Occupation: Pharmacist, hospital  ?Tobacco Use  ? Smoking status: Never  ? Smokeless tobacco: Never  ?Vaping Use  ? Vaping Use: Never used  ?Substance and Sexual Activity  ? Alcohol use: Yes  ?  Comment: occassionally   ? Drug use: No  ? Sexual activity: Not Currently  ?Other Topics Concern  ? Not on file  ?Social History Narrative  ? Single  ? Orig from [place]  ? HH of 4  One dog  ?     IB program.   ? [name].   [employer] fall 15  ? Girls scouts   ? Education major, spanish minor   ? 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 ?Social Determinants of Health  ? ?Financial Resource Strain: Not on file  ?Food Insecurity: Not on file  ?Transportation Needs: Not on file  ?Physical Activity: Not on file  ?Stress: Not on file  ?Social Connections: Not on file  ? ? ?Outpatient Medications Prior to Visit  ?Medication Sig Dispense Refill  ? albuterol (PROAIR HFA) 108 (90 Base) MCG/ACT inhaler Inhale 2 puffs into the lungs every 6 (six) hours as needed for wheezing. 20.1 g 1  ? albuterol (PROVENTIL) (2.5 MG/3ML) 0.083% nebulizer solution Take 3 mLs (2.5 mg total) by nebulization every 4 (four) hours as needed for wheezing or shortness of breath. 75 mL 2  ? betamethasone dipropionate 0.05 % lotion SMARTSIG:Sparingly Topical    ? Budeson-Glycopyrrol-Formoterol (BREZTRI AEROSPHERE) 160-9-4.8 MCG/ACT AERO Inhale 2 puffs  into the lungs in the morning and at bedtime. 10.7 g 5  ? cetirizine (ZYRTEC) 10 MG tablet Take 1 tablet (10 mg total) by mouth 2 (two) times daily as needed for allergies (Use an extra dose during flare ups.). 60 tablet 5  ? EPINEPHrine 0.3 mg/0.3 mL IJ SOAJ injection Inject 0.3 mg into the muscle as directed.    ? FASENRA PEN 30 MG/ML SOAJ Inject 30 mg into the skin every 30 (thirty) days.    ? fexofenadine (ALLEGRA ALLERGY) 180 MG tablet Take 1 tablet (180 mg total) by mouth daily. 1 tablet 0  ? montelukast (SINGULAIR) 10 MG tablet Take 1 tablet (10 mg total) by mouth at bedtime. 30 tablet 11  ? Olopatadine-Mometasone (RYALTRIS) [id number] MCG/ACT SUSP Place 2 sprays into the nose 2 (two) times daily. 29 g 5  ? fluticasone (FLONASE) 50 MCG/ACT nasal spray 2 spray each nostril qd (Patient not taking: Reported on 04/08/2021) 16 g 3  ? fluticasone furoate-vilanterol (BREO ELLIPTA) 100-25 MCG/INH AEPB Inhale 1 puff into the lungs daily. For asthma (Patient not taking: Reported on 04/08/2021) 1 each 6   ? ?Facility-Administered Medications Prior to Visit  ?Medication Dose Route Frequency Provider Last Rate Last Admin  ? Benralizumab SOSY 30 mg  30 mg Subcutaneous Q28 days Jiles Prows, MD   30 mg at 03/07/21 1655  ? ? ? ?EXAM: ? ?BP 106/80 (BP Location: Left Arm, Patient Position: Sitting, Cuff Size: Normal)   Pulse 89   Temp 98 ?F (36.7 ?C) (Oral)   Ht 5\' 2"  (1.575 m)   Wt 160 lb 12.8 oz (72.9 kg)   LMP 04/03/2021   SpO2 98%   BMI 29.41 kg/m?  ? ?Body mass index is 29.41 kg/m?. ? ?GENERAL: vitals reviewed and listed above, alert, oriented, appears well hydrated and in no acute distress looks allergic  ?HEENT: atraumatic, conjunctiva  clear, no obvious abnormalities on inspection of external nose and ears OP :masked  ?NECK: no obvious masses on inspection  thyroid easily palpable  no nodules  ?CV: HRRR, no clubbing cyanosis or  peripheral edema nl cap refill  ?MS: moves all extremities without noticeable focal  abnormality ?PSYCH: pleasant and cooperative, no obvious depression or anxiety nl cognition  ?Lab Results  ?Component Value Date  ? WBC 12.5 (H) 02/25/2021  ? HGB 11.2 (L) 02/25/2021  ? HCT 34.9 (L) 02/25/2021  ? PLT 303.0 02/25/2021  ? GLUCOSE 89 02/25/2021  ? CHOL 175 09/02/2019  ? TRIG 67 09/02/2019  ? HDL 54 09/02/2019  ? LDLCALC 106 (H) 09/02/2019  ? ALT 20 01/11/2021  ? AST 19 01/11/2021  ? NA 137 02/25/2021  ? K 3.6 02/25/2021  ? CL 103 02/25/2021  ? CREATININE 0.48 02/25/2021  ? BUN 10 02/25/2021  ? CO2 26 02/25/2021  ? TSH 1.89 09/02/2019  ? HGBA1C 5.9 02/25/2021  ? ?BP Readings from Last 3 Encounters:  ?04/08/21 106/80  ?03/18/21 120/84  ?03/05/21 99/68  ? ? ?ASSESSMENT AND PLAN: ? ?Discussed the following assessment and plan: ? ?Anxiety - Plan: TSH, T4, Free ? ?Anxiety attack ? ?Enlarged thyroid - Plan: TSH, T4, Free ? ?Medication management ? ?Personal history of anaphylaxis ?Symptoms most consistent with anxiety with panic flares most likely triggered by somatic allergy attacks that  she has had in the recent past requiring hospital emergency care is doing better clinically but is very sensitive and bit anxious as to pay attention to what she is eating etc. ?No other complicating factors  obvious. ?Agree with continuing therapy or get a therapist that can do in person that might help her with this flareup of anxiety and panic. ?Would also check thyroid test at next lab test as last was done July 2021 she does have a palpable thyroid gland although I do not think her symptoms are related to thyroid disease. ? ?Discussed options prescribing etc. at this point we can add low-dose Lexapro 5 mg a day and follow-up med check in about 3 weeks on medicine virtual or in person.  Be aware of nuisance side effects that can resolve and send [place] a message if needed ?Will not start medicine until also okayed by allergy team .  We will send a message to Dr. Carmelina Peal and if he has no objections we rx lexapro 5 mg per day ? ?Time review  counsel plan and communication 42 minutes  ?-Patient advised to return or notify health care team  if  new concerns arise. ? ?Patient Instructions  ?Agree with counselor  for aggravation of anxiety .  ? ?Will check with your  allergist about adding medication but should not usually interfere with  treatments.  And status.  ? ?Considering  lexapro  low dose 5 mg per day .  ?Plan 3 week  med check after beginning.  Virtual ok . ? ?Let us know in interim   if having new problems or ?s  ? ?We can add thyroid  test with next blood draw.  ? ? ? ? ? ? ?Standley Brooking. Kagen Kunath M.D. ?

## 2021-04-09 ENCOUNTER — Other Ambulatory Visit: Payer: Self-pay | Admitting: Internal Medicine

## 2021-04-09 MED ORDER — ESCITALOPRAM OXALATE 5 MG PO TABS: 5.0000 mg | ORAL_TABLET | Freq: Every day | ORAL | 2 refills | Status: DC

## 2021-04-30 ENCOUNTER — Ambulatory Visit: Payer: BC Managed Care – PPO | Admitting: Allergy and Immunology

## 2021-04-30 ENCOUNTER — Other Ambulatory Visit: Payer: Self-pay

## 2021-04-30 ENCOUNTER — Encounter: Payer: Self-pay | Admitting: Allergy and Immunology

## 2021-04-30 VITALS — BP 104/76 | HR 99 | Temp 98.7°F | Resp 18 | Ht 62.0 in | Wt 163.5 lb

## 2021-04-30 DIAGNOSIS — J4550 Severe persistent asthma, uncomplicated: Secondary | ICD-10-CM

## 2021-04-30 DIAGNOSIS — J339 Nasal polyp, unspecified: Secondary | ICD-10-CM | POA: Diagnosis not present

## 2021-04-30 DIAGNOSIS — T7800XA Anaphylactic reaction due to unspecified food, initial encounter: Secondary | ICD-10-CM

## 2021-04-30 DIAGNOSIS — R430 Anosmia: Secondary | ICD-10-CM

## 2021-04-30 DIAGNOSIS — T7819XD Other adverse food reactions, not elsewhere classified, subsequent encounter: Secondary | ICD-10-CM

## 2021-04-30 DIAGNOSIS — J3089 Other allergic rhinitis: Secondary | ICD-10-CM | POA: Diagnosis not present

## 2021-04-30 DIAGNOSIS — T781XXD Other adverse food reactions, not elsewhere classified, subsequent encounter: Secondary | ICD-10-CM

## 2021-04-30 DIAGNOSIS — J301 Allergic rhinitis due to pollen: Secondary | ICD-10-CM

## 2021-04-30 MED ORDER — EPINEPHRINE 0.3 MG/0.3ML IJ SOAJ: 0.3000 mg | INTRAMUSCULAR | 1 refills | Status: DC

## 2021-04-30 MED ORDER — DUPILUMAB 300 MG/2ML ~~LOC~~ SOSY
300.0000 mg | PREFILLED_SYRINGE | SUBCUTANEOUS | Status: DC
  Administered 2021-04-30: 300 mg via SUBCUTANEOUS

## 2021-04-30 MED ORDER — RYALTRIS 665-25 MCG/ACT NA SUSP: 2.0000 | Freq: Two times a day (BID) | NASAL | 5 refills | Status: DC

## 2021-04-30 MED ORDER — ALBUTEROL SULFATE HFA 108 (90 BASE) MCG/ACT IN AERS: 2.0000 | INHALATION_SPRAY | Freq: Four times a day (QID) | RESPIRATORY_TRACT | 1 refills | Status: DC | PRN

## 2021-04-30 MED ORDER — BREZTRI AEROSPHERE 160-9-4.8 MCG/ACT IN AERO: 2.0000 | INHALATION_SPRAY | Freq: Two times a day (BID) | RESPIRATORY_TRACT | 5 refills | Status: DC

## 2021-04-30 MED ORDER — CETIRIZINE HCL 10 MG PO TABS: 10.0000 mg | ORAL_TABLET | Freq: Two times a day (BID) | ORAL | 5 refills | Status: DC | PRN

## 2021-04-30 NOTE — Progress Notes (Signed)
Patient came in today for her follow up. Dr. Lucie Leather changed patient form Brittany Simon to Brittany Simon today for her asthma. Patient received her loading dose of 600mg  in office. Patient sat in room 3 for fifteen minutes then moved to the injection room lobby for another ten minutes. Patient was scheduled to come back in two weeks for her next injection per the note from Dr. . Patient tolerated the injections well with no issue.  ?

## 2021-04-30 NOTE — Patient Instructions (Addendum)
?  1.  Allergen avoidance measures - dust mite, cat, dog, pollen, alternaria mold, tree nut ? ?2.  Treat and prevent inflammation: ? ?A. Breztri - 2 inhalations 2 times per day w/ spacer (empty lungs) ?B. Ryaltris - 2 sprays each nostril 2 times per day   ?C. Montelukast 10 mg - 1 tablet 1 time per day ?D. Dupilumab injection today and every 2 weeks (replaces benralizumab)  ? ?3.  If needed: ? ?A. Albuterol HFA - 2 inhalations every 4-6 hours  ?B. Cetirizine 10 mg - 1 tablet 1 time per day ?C. Epi-Pen, benadryl, MD/ER evaluation for allergic reaction ? ?4.  Start a course of immunotherapy in April 2023 ? ?5. Return to clinic in 12 weeks or earlier if problem ?

## 2021-04-30 NOTE — Progress Notes (Signed)
? ?[place] ? ? ?Follow-up Note ? ?Referring Provider: Burnis Medin, MD ?Primary Provider: Burnis Medin, MD ?Date of Office Visit: 04/30/2021 ? ?Subjective:  ? ?Brittany Simon (DOB: Dec 10, 1995) is a 26 y.o. female who returns to the [facility] [place] on 04/30/2021 in re-evaluation of the following: ? ?HPI: Arushi returns to this clinic in evaluation of severe persistent asthma, allergic rhinitis, nasal polyposis, oral food allergy syndrome, eosinophilia, lymphocytosis, and basophilia.  I last saw her in this clinic on 29 January 2021. ? ?She has improved regarding her asthma.  Her requirement for short acting bronchodilator has been eliminated.  She can exert herself without any problem.  This is the best control she has had in her asthma over the course of the past year.  She still continues to use a triple inhaler and still continues on benralizumab injections. ? ?She has not had such a good response regarding her upper airway symptoms.  She still has lots of nasal congestion and runny nose and has never obtained her ability to smell food.  She did develop a "cold" last week with nasal congestion and sore throat which is improving and now she is back to her baseline nasal congestion. ? ?Since I have seen her in this clinic she had an anaphylactic reaction, etiology unknown, suspect tree nut consumption, that occurred at the end of January successfully responding to injectable epinephrine.  She did drink almond milk in association with that reaction.  She has remained away from almond consumption since that reaction has eaten all other foods temporally associated with that reaction since that point in time without any problem. ? ?Allergies as of 04/30/2021   ?No Known Allergies ?  ? ?  ?Medication List  ? ? ?albuterol (2.5 MG/3ML) 0.083% nebulizer solution ?Commonly known as: PROVENTIL ?Take 3 mLs (2.5 mg total) by nebulization every 4 (four) hours as  needed for wheezing or shortness of breath. ?  ?albuterol 108 (90 Base) MCG/ACT inhaler ?Commonly known as: ProAir HFA ?Inhale 2 puffs into the lungs every 6 (six) hours as needed for wheezing. ?  ?betamethasone dipropionate 0.05 % lotion ?SMARTSIG:Sparingly Topical ?  ?Breztri Aerosphere 160-9-4.8 MCG/ACT Aero ?Generic drug: Budeson-Glycopyrrol-Formoterol ?Inhale 2 puffs into the lungs in the morning and at bedtime. ?  ?EPINEPHrine 0.3 mg/0.3 mL Soaj injection ?Commonly known as: EPI-PEN ?Inject 0.3 mg into the muscle as directed. ?  ?escitalopram 5 MG tablet ?Commonly known as: Lexapro ?Take 1 tablet (5 mg total) by mouth daily. ?  ?Fasenra Pen 30 MG/ML Soaj ?Generic drug: Benralizumab ?Inject 30 mg into the skin every 30 (thirty) days. ?  ?fexofenadine 180 MG tablet ?Commonly known as: Allegra Allergy ?Take 1 tablet (180 mg total) by mouth daily. ?  ?fluticasone 50 MCG/ACT nasal spray ?Commonly known as: Flonase ?2 spray each nostril qd ?  ?montelukast 10 MG tablet ?Commonly known as: SINGULAIR ?Take 1 tablet (10 mg total) by mouth at bedtime. ?  ?Ryaltris 665-25 MCG/ACT Susp ?Generic drug: Olopatadine-Mometasone ?Place 2 sprays into the nose 2 (two) times daily. ?  ? ?Past Medical History:  ?Diagnosis Date  ? Allergy   ? Anemia 02/2018  ? Asthma   ? Prematurity, fetus 35-36 completed weeks of gestation   ?  4 40z 36 weeks  ? ? ?History reviewed. No pertinent surgical history. ? ?Review of systems negative except as noted in HPI / PMHx or noted below: ? ?Review of Systems  ?  Constitutional: Negative.   ?HENT: Negative.    ?Eyes: Negative.   ?Respiratory: Negative.    ?Cardiovascular: Negative.   ?Gastrointestinal: Negative.   ?Genitourinary: Negative.   ?Musculoskeletal: Negative.   ?Skin: Negative.   ?Neurological: Negative.   ?Endo/Heme/Allergies: Negative.   ?Psychiatric/Behavioral: Negative.    ? ? ?Objective:  ? ?Vitals:  ? 04/30/21 0835  ?BP: 104/76  ?Pulse: 99  ?Resp: 18  ?Temp: 98.7 ?F (37.1 ?C)  ?SpO2:  97%  ? ?Height: 5\' 2"  (157.5 cm)  ?Weight: 163 lb 8 oz (74.2 kg)  ? ?Physical Exam ?Constitutional:   ?   Appearance: She is not diaphoretic.  ?   Comments: Nasal voice  ?HENT:  ?   Head: Normocephalic.  ?   Right Ear: Tympanic membrane, ear canal and external ear normal.  ?   Left Ear: Tympanic membrane, ear canal and external ear normal.  ?   Nose: Nose normal. No mucosal edema (Nasal polyp deep in the nasal vault left nostril) or rhinorrhea.  ?   Mouth/Throat:  ?   Pharynx: Uvula midline. No oropharyngeal exudate.  ?Eyes:  ?   Conjunctiva/sclera: Conjunctivae normal.  ?Neck:  ?   Thyroid: No thyromegaly.  ?   Trachea: Trachea normal. No tracheal tenderness or tracheal deviation.  ?Cardiovascular:  ?   Rate and Rhythm: Normal rate and regular rhythm.  ?   Heart sounds: Normal heart sounds, S1 normal and S2 normal. No murmur heard. ?Pulmonary:  ?   Effort: No respiratory distress.  ?   Breath sounds: Normal breath sounds. No stridor. No wheezing or rales.  ?Lymphadenopathy:  ?   Head:  ?   Right side of head: No tonsillar adenopathy.  ?   Left side of head: No tonsillar adenopathy.  ?   Cervical: No cervical adenopathy.  ?Skin: ?   Findings: No erythema or rash.  ?   Nails: There is no clubbing.  ?Neurological:  ?   Mental Status: She is alert.  ? ? ?Diagnostics:  ?  ?Spirometry was performed and demonstrated an FEV1 of 1.86 at 70 % of predicted. ? ?Results of blood tests obtained 29 January 2021 identified negative ANCA screen, IgE 188 KU/L. ? ?Results of blood tests obtained 25 February 2021 identified WBC 12.5, absolute eosinophils 0, absolute basophil 100, absolute lymphocyte 2500, hemoglobin 11.2, platelet 303, peanut IgE 0.12 KU/L, almond 0.22 KU/L, pistachio 0.20 KU/L, macadamia nut 0.21 KU/L, Ara H80.79 KU/L, hazelnut 3.54 KU/L. ? ?Results of a chest x-ray obtained 05 March 2021 identified the following: ? ?The heart and mediastinal contours are within normal limits.  ?No focal consolidation. No pulmonary  edema. No pleural effusion. No pneumothorax. ? ?Assessment and Plan:  ? ?1. Asthma, severe persistent, well-controlled   ?2. Perennial allergic rhinitis   ?3. Seasonal allergic rhinitis due to pollen   ?4. Nasal polyposis   ?5. Anosmia   ?6. Pollen-food allergy, subsequent encounter   ?7. Allergy with anaphylaxis due to food   ? ? ?1.  Allergen avoidance measures - dust mite, cat, dog, pollen, alternaria mold, tree nut ? ?2.  Treat and prevent inflammation: ? ?A. Breztri - 2 inhalations 2 times per day w/ spacer (empty lungs) ?B. Ryaltris - 2 sprays each nostril 2 times per day   ?C. Montelukast 10 mg - 1 tablet 1 time per day ?D. Dupilumab injection today and every 2 weeks (replaces benralizumab)  ? ?3.  If needed: ? ?A. Albuterol HFA - 2 inhalations every 4-6  hours  ?B. Cetirizine 10 mg - 1 tablet 1 time per day ?C. Epi-Pen, benadryl, MD/ER evaluation for allergic reaction ? ?4.  Start a course of immunotherapy in April 2023 ? ?5. Return to clinic in 12 weeks or earlier if problem ? ?Cherlin has had improvement regarding her lower airway disease on her current plan.  However, she has not had very good response regarding her upper airways with her plan and I think we need to change her biologic agent from an anti-IL-5 agent to an anti-IL-4/13 agent and we will do so with the administration of dupilumab starting today in an attempt to obtain benefit regarding both upper and lower airway atopic and eosinophilic disease.  She will continue on a collection of anti-inflammatory agents for both her upper and lower airway.  For now she is going to remain away from tree nut consumption assuming that was the etiologic agent giving rise to her episode anaphylaxis.  I will see her back in this clinic in 12 weeks or earlier if there is a problem. ? ?Allena Katz, MD ?Allergy / Immunology ?[place] ?

## 2021-05-01 ENCOUNTER — Encounter: Payer: Self-pay | Admitting: Allergy and Immunology

## 2021-05-01 ENCOUNTER — Other Ambulatory Visit: Payer: Self-pay | Admitting: Allergy

## 2021-05-01 DIAGNOSIS — J328 Other chronic sinusitis: Secondary | ICD-10-CM

## 2021-05-01 DIAGNOSIS — J329 Chronic sinusitis, unspecified: Secondary | ICD-10-CM

## 2021-05-01 DIAGNOSIS — L03213 Periorbital cellulitis: Secondary | ICD-10-CM

## 2021-05-01 LAB — HM PAP SMEAR

## 2021-05-01 NOTE — Addendum Note (Signed)
Addended by: Dollene Cleveland R on: 05/01/2021 10:07 AM ? ? Modules accepted: Orders ? ?

## 2021-05-01 NOTE — Progress Notes (Signed)
Requisition has been signed by Dr. Lucie Leather and faxed to [facility] to [phone]. Called and left a voicemail asking for patient to return call to inform. She will need to call [phone] to schedule appointment for CT as soon as possible.  ?

## 2021-05-01 NOTE — Progress Notes (Signed)
Patient called back and was advised of needed CT Scan. Patient was provided with phone number and will call to schedule as soon as possible.  ?

## 2021-05-01 NOTE — Progress Notes (Signed)
Called and spoke with patient and she stated that her right eye is no longer red but is still very swollen, she is able to see out of her right eye and does not have any vision issues. She has been doing the warm compresses as Dr. Delorse Lek instructed.  ?

## 2021-05-01 NOTE — Addendum Note (Signed)
Addended by: Dollene Cleveland R on: 05/01/2021 09:32 AM ? ? Modules accepted: Orders ? ?

## 2021-05-01 NOTE — Progress Notes (Signed)
Called pt shortly before clinic opening.  Pt got dupixent injection yesterday and states she woke up around 12:30am with a red and puffy right eye and is slightly tender.  She used a OTC red eye crop that didn't help much.  Does not report any other symptoms and states injection site is fine.   ?Pt sees Dr. Lucie Leather.   Advised to apply warm compresses to the eye and would send this message to Dr. Lucie Leather nurses.    ?

## 2021-05-01 NOTE — Progress Notes (Signed)
Order has been placed for CT Sinus without Contrast, called and spoke with the patients insurance and received approval for the CT Scan. Approval # is [id number], CT will have to be conducted at [facility] since it is in network for the patient. Phone number is [phone], fax is [phone]. Printed requisition and faxed to [place] for Dr. Lucie Leather to sign and fax back to me in [place], once received will fax to servicing office and call patient to inform.  ?

## 2021-05-02 ENCOUNTER — Telehealth: Payer: Self-pay | Admitting: *Deleted

## 2021-05-02 ENCOUNTER — Other Ambulatory Visit: Payer: Self-pay | Admitting: *Deleted

## 2021-05-02 ENCOUNTER — Other Ambulatory Visit: Payer: Self-pay | Admitting: Internal Medicine

## 2021-05-02 DIAGNOSIS — J329 Chronic sinusitis, unspecified: Secondary | ICD-10-CM

## 2021-05-02 DIAGNOSIS — R930 Abnormal findings on diagnostic imaging of skull and head, not elsewhere classified: Secondary | ICD-10-CM

## 2021-05-02 MED ORDER — CLINDAMYCIN HCL 150 MG PO CAPS: ORAL_CAPSULE | ORAL | 0 refills | Status: DC

## 2021-05-02 NOTE — Telephone Encounter (Signed)
Per Dr. Neldon Mc, I informed Brittany Simon of her Sinus Ct Scan results. She was informed that she has bad chronic sinusitis and will need to take Clindamycin 150 mg 3 times daily for 30 days. He also recommends that she see ENT and get the follow labs, IgA/IgG/IgM, Pneumo 23, and Tetanus Antibodies. She was requesting to see an ENT within [facility] but I did let her know that I did not think there was an ENT left within the [facility] system. I left a message for her to call back and we will inform her that a referral has been sent to [facility] which is affiliated with [facility]. She also needs to be informed of the lab work, which she can have drawn at any [place] location as well as our [place] office.   ?

## 2021-05-06 ENCOUNTER — Encounter: Payer: Self-pay | Admitting: *Deleted

## 2021-05-06 NOTE — Telephone Encounter (Signed)
Brittany Simon informed and she will be back in touch. ?

## 2021-05-06 NOTE — Telephone Encounter (Signed)
Brittany Simon has an appointment with ENT in May. Also, the Clindamycin is causing nausea. I did confirm that she is taking it with food and she said that she has only been taking it for a couple of days so she didn't know if maybe it would get better? Please advise? ?

## 2021-05-16 ENCOUNTER — Telehealth: Payer: Self-pay

## 2021-05-16 ENCOUNTER — Ambulatory Visit: Payer: BC Managed Care – PPO

## 2021-05-16 NOTE — Telephone Encounter (Signed)
Called patient to advise approval of Tezspire to replace Dupixent due to possible reaction with this therapy.  Patient wants to read up on Tezspire and will reach back out to me I she wants to start therapy ?

## 2021-05-16 NOTE — Telephone Encounter (Signed)
Spoke to Brittany Simon patient had a reaction to the dupixent sample. Brittany Simon plans to call and cancel the immunotherapy appointment for 05/16/21. She will be switching to tezspire.  ?

## 2021-06-06 ENCOUNTER — Other Ambulatory Visit: Payer: Self-pay | Admitting: Internal Medicine

## 2021-06-06 NOTE — Telephone Encounter (Signed)
Last Ov 04/08/21 ?Filled 04/09/21 ?Is it ok to refill? ?

## 2021-06-07 NOTE — Telephone Encounter (Signed)
Have her make an appt  virtual ok for med check. Evlauation  And refill 90 days  no refills

## 2021-06-11 ENCOUNTER — Encounter: Payer: Self-pay | Admitting: Internal Medicine

## 2021-06-11 ENCOUNTER — Ambulatory Visit: Payer: BC Managed Care – PPO | Admitting: Allergy and Immunology

## 2021-06-11 ENCOUNTER — Telehealth (INDEPENDENT_AMBULATORY_CARE_PROVIDER_SITE_OTHER): Payer: BC Managed Care – PPO | Admitting: Internal Medicine

## 2021-06-11 VITALS — Wt 161.0 lb

## 2021-06-11 DIAGNOSIS — Z79899 Other long term (current) drug therapy: Secondary | ICD-10-CM

## 2021-06-11 DIAGNOSIS — F419 Anxiety disorder, unspecified: Secondary | ICD-10-CM

## 2021-06-11 NOTE — Progress Notes (Signed)
?Virtual Visit via Video Note ? ?I connected with Brittany Simon on 06/11/21 at  4:00 PM EDT by a video enabled telemedicine application and verified that I am speaking with the correct person using two identifiers. ?Location patient:work  ?Location provider:work office ?Persons participating in the virtual visit: patient, provider ? ?[place] national recommendations  regarding COVID 19 pandemic   video visit is advised over in office visit for this patient.  ?Patient aware  of the limitations of evaluation and management by telemedicine and  availability of in person appointments. and agreed to proceed. ? ? ?HPI: ?LYRIK DOCKSTADER presents for video visit follow-up of medication. ?She has been taking Lexapro 5 mg a day and thinks it is quite helpful and likes to stay on it.  She has had the last being on edge and anxiety flares.  When she gets tight feeling in her chest and anxious she can now tell the difference usually between her asthma and anxiety because it does not augment. ? ?She is under continued care with allergy asthma specialist. ?No other change in her health. ? ?ROS: See pertinent positives and negatives per HPI. ? ?Past Medical History:  ?Diagnosis Date  ? Acute respiratory failure with hypoxia (HCC) 01/10/2021  ? Allergy   ? Anemia 02/2018  ? Asthma   ? Infectious mononucleosis 09/26/2013  ? presenting with has and periorbital swelling off and on no other alarm features   ? Prematurity, fetus 35-36 completed weeks of gestation   ?  4 40z 36 weeks  ? ? ?History reviewed. No pertinent surgical history. ? ?Family History  ?Problem Relation Age of Onset  ? Colon polyps Mother   ? Asthma Father   ? Colon cancer Neg Hx   ? Esophageal cancer Neg Hx   ? Rectal cancer Neg Hx   ? Stomach cancer Neg Hx   ? ? ?Social History  ? ?Tobacco Use  ? Smoking status: Never  ? Smokeless tobacco: Never  ?Vaping Use  ? Vaping Use: Never used  ?Substance Use Topics  ? Alcohol use: Yes  ?  Comment: occassionally   ? Drug use: No   ? ? ? ? ?Current Outpatient Medications:  ?  albuterol (PROAIR HFA) 108 (90 Base) MCG/ACT inhaler, Inhale 2 puffs into the lungs every 6 (six) hours as needed for wheezing., Disp: 18 g, Rfl: 1 ?  albuterol (PROVENTIL) (2.5 MG/3ML) 0.083% nebulizer solution, Take 3 mLs (2.5 mg total) by nebulization every 4 (four) hours as needed for wheezing or shortness of breath., Disp: 75 mL, Rfl: 2 ?  betamethasone dipropionate 0.05 % lotion, SMARTSIG:Sparingly Topical, Disp: , Rfl:  ?  Budeson-Glycopyrrol-Formoterol (BREZTRI AEROSPHERE) 160-9-4.8 MCG/ACT AERO, Inhale 2 puffs into the lungs in the morning and at bedtime., Disp: 10.7 g, Rfl: 5 ?  cetirizine (ZYRTEC) 10 MG tablet, Take 1 tablet (10 mg total) by mouth 2 (two) times daily as needed for allergies (Can use an extra dose during flare ups.)., Disp: 60 tablet, Rfl: 5 ?  clindamycin (CLEOCIN) 150 MG capsule, Take one capsule three times daily for 30 days, Disp: 90 capsule, Rfl: 0 ?  EPINEPHrine 0.3 mg/0.3 mL IJ SOAJ injection, Inject 0.3 mg into the muscle as directed., Disp: 2 each, Rfl: 1 ?  escitalopram (LEXAPRO) 5 MG tablet, TAKE 1 TABLET (5 MG TOTAL) BY MOUTH DAILY., Disp: 90 tablet, Rfl: 0 ?  FASENRA PEN 30 MG/ML SOAJ, Inject 30 mg into the skin every 30 (thirty) days., Disp: , Rfl:  ?  fexofenadine (ALLEGRA ALLERGY) 180 MG tablet, Take 1 tablet (180 mg total) by mouth daily., Disp: 1 tablet, Rfl: 0 ?  fluticasone (FLONASE) 50 MCG/ACT nasal spray, 2 spray each nostril qd, Disp: 16 g, Rfl: 3 ?  montelukast (SINGULAIR) 10 MG tablet, Take 1 tablet (10 mg total) by mouth at bedtime., Disp: 30 tablet, Rfl: 11 ?  Olopatadine-Mometasone (RYALTRIS) 665-25 MCG/ACT SUSP, Place 2 sprays into the nose 2 (two) times daily., Disp: 29 g, Rfl: 5 ? ?Current Facility-Administered Medications:  ?  Benralizumab SOSY 30 mg, 30 mg, Subcutaneous, Q28 days, Kozlow, Alvira Philips, MD, 30 mg at 03/07/21 1655 ?  dupilumab (DUPIXENT) prefilled syringe 300 mg, 300 mg, Subcutaneous, Q14 Days, Kozlow,  Alvira Philips, MD, 300 mg at 04/30/21 1804 ? ?EXAM: ?BP Readings from Last 3 Encounters:  ?04/30/21 104/76  ?04/08/21 106/80  ?03/18/21 120/84  ? ? ?VITALS per patient if applicable: ? ?GENERAL: alert, oriented, appears well and in no acute distress ? ?HEENT: atraumatic, conjunttiva clear, no obvious abnormalities on inspection of external nose and ears ? ?MS: moves all visible extremities without noticeable abnormality ? ?PSYCH/NEURO: pleasant and cooperative, no obvious depression or anxiety, speech and thought processing grossly intact ? ? ?ASSESSMENT AND PLAN: ? ?Discussed the following assessment and plan: ? ?  ICD-10-CM   ?1. Anxiety  F41.9   ?  ?2. Medication management  Z79.899   ?  ? ?Problem List Items Addressed This Visit   ? ?  ? Other  ? Anxiety - Primary  ?  Significant improvement reported on low-dose Lexapro 5 mg.  See note ?Continue medication follow-up in August September.  Or as needed. ? ?  ?  ? ?Other Visit Diagnoses   ? ? Medication management      ? ?  ? ? ?Improvement on med ?Counseled.  ? Expectant management and discussion of plan and treatment with opportunity to ask questions and all were answered. The patient agreed with the plan and demonstrated an understanding of the instructions. ?  ?Advised to call back or seek an in-person evaluation if worsening  or having  further concerns  in interim. ?Return for Aug sept med check. ? ? ? ?Berniece Andreas, MD  ?

## 2021-06-11 NOTE — Assessment & Plan Note (Signed)
Significant improvement reported on low-dose Lexapro 5 mg.  See note ?Continue medication follow-up in August September.  Or as needed. ?

## 2021-06-24 DIAGNOSIS — J339 Nasal polyp, unspecified: Secondary | ICD-10-CM | POA: Insufficient documentation

## 2021-07-10 ENCOUNTER — Ambulatory Visit (HOSPITAL_COMMUNITY)
Admission: EM | Admit: 2021-07-10 | Discharge: 2021-07-10 | Disposition: A | Discharge status: Home / Self Care | Payer: BC Managed Care – PPO | Attending: Physician Assistant | Admitting: Physician Assistant

## 2021-07-10 ENCOUNTER — Encounter (HOSPITAL_COMMUNITY): Payer: Self-pay

## 2021-07-10 ENCOUNTER — Ambulatory Visit (INDEPENDENT_AMBULATORY_CARE_PROVIDER_SITE_OTHER): Payer: BC Managed Care – PPO

## 2021-07-10 DIAGNOSIS — R059 Cough, unspecified: Secondary | ICD-10-CM

## 2021-07-10 DIAGNOSIS — J4541 Moderate persistent asthma with (acute) exacerbation: Secondary | ICD-10-CM | POA: Diagnosis not present

## 2021-07-10 MED ORDER — METHYLPREDNISOLONE SODIUM SUCC 125 MG IJ SOLR
80.0000 mg | Freq: Once | INTRAMUSCULAR | Status: AC
Start: 2021-07-10 — End: 2021-07-10
  Administered 2021-07-10: 80 mg via INTRAMUSCULAR

## 2021-07-10 MED ORDER — METHYLPREDNISOLONE SODIUM SUCC 125 MG IJ SOLR
INTRAMUSCULAR | Status: AC
  Filled 2021-07-10: qty 2

## 2021-07-10 MED ORDER — IPRATROPIUM-ALBUTEROL 0.5-2.5 (3) MG/3ML IN SOLN
RESPIRATORY_TRACT | Status: AC
  Filled 2021-07-10: qty 3

## 2021-07-10 MED ORDER — PREDNISONE 10 MG (21) PO TBPK: ORAL_TABLET | ORAL | 0 refills | Status: DC

## 2021-07-10 MED ORDER — IPRATROPIUM-ALBUTEROL 0.5-2.5 (3) MG/3ML IN SOLN
3.0000 mL | Freq: Once | RESPIRATORY_TRACT | Status: AC
  Administered 2021-07-10: 3 mL via RESPIRATORY_TRACT

## 2021-07-10 NOTE — ED Triage Notes (Signed)
Pt c/o coughing x3 days and wheezing with chest tightness x2 days. States used her inhaler and had a neb tx today with no relief. No distress noted at this time.

## 2021-07-10 NOTE — ED Provider Notes (Signed)
[place]    CSN: [id number] Arrival date & time: 07/10/21  1631      History   Chief Complaint Chief Complaint  Patient presents with   Cough   Wheezing    HPI Brittany Simon is a 26 y.o. female.   Patient presents today with a 3-day history of worsening cough and shortness of breath.  She has a history of asthma and has been hospitalized as recently as December 2022.  At that time she required BiPAP but did not require intubation.  She was seen for asthma attack in the emergency room in May 2023 at which point symptoms improved with back-to-back nebulizers and 60 mg of prednisone.  She was not discharged on steroids.  She does see an allergy specialist and is currently taking maintenance medications including Singulair, Fasenra, betamethasone.  Reports taking medication as prescribed without missing doses or noted side effects.  She has been using albuterol without improvement of symptoms prompting evaluation today.  She is confident that she is not pregnant.  Denies history of diabetes.   Past Medical History:  Diagnosis Date   Acute respiratory failure with hypoxia ([place]) 01/10/2021   Allergy    Anemia 02/2018   Asthma    Infectious mononucleosis 09/26/2013   presenting with has and periorbital swelling off and on no other alarm features    Prematurity, fetus 35-36 completed weeks of gestation     4 40z 36 weeks    Patient Active Problem List   Diagnosis Date Noted   Anxiety 06/11/2021   Headache(784.0) 09/26/2013   Reactive airway disease 05/25/2012   Hyperhidrosis of axilla 05/13/2010   ALLERGIC RHINITIS, SEASONAL 08/18/2008   Asthma 09/24/2006    History reviewed. No pertinent surgical history.  OB History   No obstetric history on file.      Home Medications    Prior to Admission medications   Medication Sig Start Date End Date Taking? Authorizing Provider  predniSONE (STERAPRED UNI-PAK 21 TAB) 10 MG (21) TBPK tablet As directed 07/10/21  Yes  Kamarri Fischetti K, PA-C  albuterol (PROAIR HFA) 108 (90 Base) MCG/ACT inhaler Inhale 2 puffs into the lungs every 6 (six) hours as needed for wheezing. 04/30/21   Kozlow, Donnamarie Poag, MD  albuterol (PROVENTIL) (2.5 MG/3ML) 0.083% nebulizer solution Take 3 mLs (2.5 mg total) by nebulization every 4 (four) hours as needed for wheezing or shortness of breath. 01/11/21 01/11/22  [place] Donnamarie Poag, DO  betamethasone dipropionate 0.05 % lotion SMARTSIG:Sparingly Topical 05/16/19   [provider]  Budeson-Glycopyrrol-Formoterol (BREZTRI AEROSPHERE) 160-9-4.8 MCG/ACT AERO Inhale 2 puffs into the lungs in the morning and at bedtime. 04/30/21   Kozlow, Donnamarie Poag, MD  cetirizine (ZYRTEC) 10 MG tablet Take 1 tablet (10 mg total) by mouth 2 (two) times daily as needed for allergies (Can use an extra dose during flare ups.). 04/30/21   Kozlow, Donnamarie Poag, MD  clindamycin (CLEOCIN) 150 MG capsule Take one capsule three times daily for 30 days 05/02/21   Kozlow, Donnamarie Poag, MD  EPINEPHrine 0.3 mg/0.3 mL IJ SOAJ injection Inject 0.3 mg into the muscle as directed. 04/30/21   Kozlow, Donnamarie Poag, MD  escitalopram (LEXAPRO) 5 MG tablet TAKE 1 TABLET (5 MG TOTAL) BY MOUTH DAILY. 06/10/21   Panosh, Standley Brooking, MD  FASENRA PEN 30 MG/ML SOAJ Inject 30 mg into the skin every 30 (thirty) days. 02/07/21   [provider]  fexofenadine (ALLEGRA ALLERGY) 180 MG tablet Take 1 tablet (180  mg total) by mouth daily. 10/31/20   Nelwyn Salisbury, MD  fluticasone Aleda Grana) 50 MCG/ACT nasal spray 2 spray each nostril qd 10/31/20   Nelwyn Salisbury, MD  montelukast (SINGULAIR) 10 MG tablet Take 1 tablet (10 mg total) by mouth at bedtime. 10/31/20   Nelwyn Salisbury, MD  Olopatadine-Mometasone Cristal Generous) [phone] MCG/ACT SUSP Place 2 sprays into the nose 2 (two) times daily. 04/30/21   Kozlow, Alvira Philips, MD    Family History Family History  Problem Relation Age of Onset   Colon polyps Mother    Asthma Father    Colon cancer Neg Hx    Esophageal cancer Neg Hx     Rectal cancer Neg Hx    Stomach cancer Neg Hx     Social History Social History   Tobacco Use   Smoking status: Never   Smokeless tobacco: Never  Vaping Use   Vaping Use: Never used  Substance Use Topics   Alcohol use: Yes    Comment: occassionally    Drug use: No     Allergies   Patient has no known allergies.   Review of Systems Review of Systems  Constitutional:  Positive for activity change. Negative for appetite change, fatigue and fever.  HENT:  Negative for congestion, sinus pressure, sneezing and sore throat.   Respiratory:  Positive for cough, chest tightness and shortness of breath.   Cardiovascular:  Negative for chest pain.  Gastrointestinal:  Negative for abdominal pain, diarrhea, nausea and vomiting.  Neurological:  Negative for dizziness, light-headedness and headaches.    Physical Exam Triage Vital Signs ED Triage Vitals  Enc Vitals Group     BP 07/10/21 1729 122/81     Pulse Rate 07/10/21 1729 (!) 110     Resp 07/10/21 1729 18     Temp 07/10/21 1729 97.9 F (36.6 C)     Temp Source 07/10/21 1729 Oral     SpO2 07/10/21 1729 95 %     Weight --      Height --      Head Circumference --      Peak Flow --      Pain Score 07/10/21 1730 5     Pain Loc --      Pain Edu? --      Excl. in GC? --    No data found.  Updated Vital Signs BP 122/81   Pulse (!) 110   Temp 97.9 F (36.6 C) (Oral)   Resp 18   LMP 06/19/2021 (Approximate)   SpO2 95%   Visual Acuity Right Eye Distance:   Left Eye Distance:   Bilateral Distance:    Right Eye Near:   Left Eye Near:    Bilateral Near:     Physical Exam Vitals reviewed.  Constitutional:      General: She is awake. She is not in acute distress.    Appearance: Normal appearance. She is well-developed. She is not ill-appearing.     Comments: Very pleasant female appears stated age in no acute distress sitting comfortably in exam room  HENT:     Head: Normocephalic and atraumatic.      Mouth/Throat:     Pharynx: Uvula midline. No oropharyngeal exudate or posterior oropharyngeal erythema.  Cardiovascular:     Rate and Rhythm: Normal rate and regular rhythm.     Heart sounds: Normal heart sounds, S1 normal and S2 normal. No murmur heard. Pulmonary:     Effort: Pulmonary effort is normal.  Breath sounds: Wheezing present. No rhonchi or rales.     Comments: Widespread wheezing with decreased lung sounds in left base Abdominal:     Palpations: Abdomen is soft.     Tenderness: There is no abdominal tenderness.  Psychiatric:        Behavior: Behavior is cooperative.     UC Treatments / Results  Labs (all labs ordered are listed, but only abnormal results are displayed) Labs Reviewed - No data to display  EKG   Radiology DG Chest 2 View  Result Date: 07/10/2021 CLINICAL DATA:  Cough EXAM: CHEST - 2 VIEW COMPARISON:  03/05/2021 FINDINGS: The heart size and mediastinal contours are within normal limits. Both lungs are clear. The visualized skeletal structures are unremarkable. IMPRESSION: No active cardiopulmonary disease. Electronically Signed   By: Donavan Foil M.D.   On: 07/10/2021 18:37    Procedures Procedures (including critical care time)  Medications Ordered in UC Medications  ipratropium-albuterol (DUONEB) 0.5-2.5 (3) MG/3ML nebulizer solution 3 mL (3 mLs Nebulization Given 07/10/21 1835)  methylPREDNISolone sodium succinate (SOLU-MEDROL) 125 mg/2 mL injection 80 mg (80 mg Intramuscular Given 07/10/21 1835)    Initial Impression / Assessment and Plan / UC Course  I have reviewed the triage vital signs and the nursing notes.  Pertinent labs & imaging results that were available during my care of the patient were reviewed by me and considered in my medical decision making (see chart for details).     Suspect symptoms are related to asthma exacerbation.  Chest x-ray was obtained given worsening cough which showed no acute cardiopulmonary disease.  Patient  was given DuoNeb and 80 mg of Solu-Medrol in clinic with improvement of symptoms.  Recommended that she continue albuterol nebulizers at home every 4-6 hours as needed.  We will start prednisone taper tomorrow (07/11/2021) and was instructed to avoid NSAIDs with this medication.  She does have follow-up scheduled with her asthma/allergy specialist and encouraged her to call them and see if this can be moved forward.  Discussed that if she has any worsening symptoms including severe shortness of breath, persistent wheezing despite albuterol, chest pain, weakness, nausea/vomiting she needs to be seen immediately to which she expressed understanding.  Strict return precautions given.  Work excuse note provided.  Final Clinical Impressions(s) / UC Diagnoses   Final diagnoses:  Moderate persistent asthma with acute exacerbation     Discharge Instructions      Your chest x-ray looked normal.  I believe that your symptoms are related to an asthma flare.  Please continue your albuterol inhaler/nebulizer as needed.  I have sent in a prednisone taper.  Do not take NSAIDs including aspirin, ibuprofen/Advil, naproxen/Aleve with this medication as it can cause stomach bleeding.  We gave you an injection of steroids today so please start prednisone tomorrow (07/11/2021).  Follow-up with your asthma/allergy specialist.  If anything worsens and you have shortness of breath, worsening cough, fever, nausea, vomiting, weakness, wheezing despite nebulizer you should be seen immediately.     ED Prescriptions     Medication Sig Dispense Auth. Provider   predniSONE (STERAPRED UNI-PAK 21 TAB) 10 MG (21) TBPK tablet As directed 21 tablet Myrian Botello K, PA-C      PDMP not reviewed this encounter.   Terrilee Croak, PA-C 07/10/21 1915

## 2021-07-10 NOTE — Discharge Instructions (Signed)
Your chest x-ray looked normal.  I believe that your symptoms are related to an asthma flare.  Please continue your albuterol inhaler/nebulizer as needed.  I have sent in a prednisone taper.  Do not take NSAIDs including aspirin, ibuprofen/Advil, naproxen/Aleve with this medication as it can cause stomach bleeding.  We gave you an injection of steroids today so please start prednisone tomorrow (07/11/2021).  Follow-up with your asthma/allergy specialist.  If anything worsens and you have shortness of breath, worsening cough, fever, nausea, vomiting, weakness, wheezing despite nebulizer you should be seen immediately.

## 2021-08-11 NOTE — Patient Instructions (Incomplete)
  1.  Allergen avoidance measures - dust mite, cat, dog, pollen, alternaria mold, tree nut  2.  Treat and prevent inflammation:  A. Breztri - 2 inhalations 2 times per day w/ spacer (empty lungs) B. Ryaltris - 2 sprays each nostril 2 times per day   C. Montelukast 10 mg - 1 tablet 1 time per day D.    3.  If needed:  A. Albuterol HFA - 2 inhalations every 4-6 hours  B. Cetirizine 10 mg - 1 tablet 1 time per day C. Epi-Pen, benadryl, MD/ER evaluation for allergic reaction  4.  Consider immunotherapy once your asthma is under better control  5. Return to clinic in weeks or earlier if problem

## 2021-08-12 ENCOUNTER — Encounter: Payer: Self-pay | Admitting: Family

## 2021-08-12 ENCOUNTER — Ambulatory Visit: Payer: BC Managed Care – PPO | Admitting: Family

## 2021-08-12 VITALS — BP 108/62 | HR 79 | Temp 98.2°F | Resp 16 | Wt 174.4 lb

## 2021-08-12 DIAGNOSIS — T7800XA Anaphylactic reaction due to unspecified food, initial encounter: Secondary | ICD-10-CM

## 2021-08-12 DIAGNOSIS — J3089 Other allergic rhinitis: Secondary | ICD-10-CM

## 2021-08-12 DIAGNOSIS — J301 Allergic rhinitis due to pollen: Secondary | ICD-10-CM | POA: Diagnosis not present

## 2021-08-12 DIAGNOSIS — T781XXD Other adverse food reactions, not elsewhere classified, subsequent encounter: Secondary | ICD-10-CM

## 2021-08-12 DIAGNOSIS — J339 Nasal polyp, unspecified: Secondary | ICD-10-CM

## 2021-08-12 DIAGNOSIS — J4550 Severe persistent asthma, uncomplicated: Secondary | ICD-10-CM

## 2021-08-12 DIAGNOSIS — R430 Anosmia: Secondary | ICD-10-CM

## 2021-08-12 MED ORDER — BREZTRI AEROSPHERE 160-9-4.8 MCG/ACT IN AERO
2.0000 | INHALATION_SPRAY | Freq: Two times a day (BID) | RESPIRATORY_TRACT | 5 refills | Status: DC
Start: 2021-08-12 — End: 2022-02-24

## 2021-08-12 MED ORDER — ALBUTEROL SULFATE HFA 108 (90 BASE) MCG/ACT IN AERS
2.0000 | INHALATION_SPRAY | Freq: Four times a day (QID) | RESPIRATORY_TRACT | 1 refills | Status: DC | PRN
Start: 2021-08-12 — End: 2022-02-24

## 2021-08-12 MED ORDER — CETIRIZINE HCL 10 MG PO TABS: 10.0000 mg | ORAL_TABLET | Freq: Two times a day (BID) | ORAL | 5 refills | Status: DC | PRN

## 2021-08-12 MED ORDER — MONTELUKAST SODIUM 10 MG PO TABS: 10.0000 mg | ORAL_TABLET | Freq: Every day | ORAL | 5 refills | Status: DC

## 2021-08-12 NOTE — Progress Notes (Signed)
Brittany Simon [place] [address] Dept: [phone]  FOLLOW UP NOTE  Patient ID: Brittany Simon, female    DOB: 21-Jun-1995  Age: 26 y.o. MRN: [id number] Date of Office Visit: 08/12/2021  Assessment  Chief Complaint: Asthma (Pretty good. Had a flare about a month ago. Patient believes that it was due to her anxiety. Only used rescue inhaler once during the last month during the poor air quality and once more after that.), Allergic Rhinitis  (Seen ENT about nasal polyps. Prescribed Xhance but haven't started it yet.), and Other (Patient states that she feels better now that she is not working. 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  She was last seen on April 30, 2021 by Dr. Neldon Mc.  Since her last office visit she denies any new diagnosis or surgeries.  Severe persistent asthma: She is currently using Breztri 2 puffs once a day and is not regularly taking montelukast.  She has only had to use her albuterol inhaler twice since June 10.  She is no longer taking Dupixent injections due to it causing "pinkeye".  Prior to that she was on [place].  She has not yet made a decision about starting Tezspire as recommended by Dr. Neldon Mc.  She would like information on Tezspire.  Since her last office visit she has made 1 trip to the emergency room and one trip to urgent care due to breathing problems.  She has had 2 rounds of steroids since her last office visit.  She reports one of the visits felt like it was more of an anxiety attack that got out of control all of a sudden.  She was very anxious and reports that she had "all of the other anxiety symptoms."  She denies a history of smoking.  Discussed how while  she was at the emergency room on Jun 09, 2021 her CTA chest did show a left lower lobe bulla.  She has never seen pulmonary.  She does mention that many years ago, approximately 12 years ago, she did have pneumonia, but she is not sure which lung.  Seasonal and perennial allergic rhinitis: She reports that she has seen ENT and plans on starting [facility].  She reports mostly clear rhinorrhea and nasal congestion.  She denies postnasal drip.  She has had 2 sinus infections since we last saw her.  She reports that her ENT has spoken with her about possible sinus surgery due to her nasal polyps, but she would like to see how Truett Perna does first.  She does report that sometimes she can taste or smell.  She can smell stronger smells.  She alternates between Zyrtec and Allegra and it does help some with her runny nose.  She would like it noted that she has felt significantly better while not working this summer.  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  She continues to avoid tree nuts without any accidental ingestion or use of her epinephrine autoinjector device.   Drug Allergies:  No Known Allergies  Review of Systems: Review of Systems  Constitutional:  Negative for chills and fever.  HENT:         Reports  clear rhinorrhea on most days and nasal congestion.  She reports that she can smell stronger smells only.  Denies postnasal drip.  Eyes:        Denies itchy watery eyes  Respiratory:  Negative for cough, shortness of breath and wheezing.        Denies cough, wheeze, tightness in chest, shortness of breath, and nocturnal awakenings due to breathing problems  Cardiovascular:  Negative for chest pain and palpitations.  Gastrointestinal:        Denies heartburn or reflux symptoms  Genitourinary:  Negative for frequency.  Skin:  Negative for itching and rash.  Neurological:  Negative for headaches.  Endo/Heme/Allergies:   Positive for environmental allergies.     Physical Exam: BP 108/62   Pulse 79   Temp 98.2 F (36.8 C) (Temporal)   Resp 16   Wt 174 lb 6.4 oz (79.1 kg)   SpO2 99%   BMI 31.90 kg/m    Physical Exam Constitutional:      Appearance: Normal appearance.  HENT:     Head: Normocephalic and atraumatic.     Comments: Pharynx normal, eyes normal, ears normal, nose: Bilateral lower turbinates moderately edematous and pale with clear drainage noted.    Right Ear: Tympanic membrane, ear canal and external ear normal.     Left Ear: Tympanic membrane, ear canal and external ear normal.     Mouth/Throat:     Mouth: Mucous membranes are moist.     Pharynx: Oropharynx is clear.  Eyes:     Conjunctiva/sclera: Conjunctivae normal.  Cardiovascular:     Rate and Rhythm: Normal rate and regular rhythm.     Heart sounds: Normal heart sounds.  Pulmonary:     Effort: Pulmonary effort is normal.     Breath sounds: Normal breath sounds.     Comments: Lungs clear to auscultation Musculoskeletal:     Cervical back: Neck supple.  Skin:    General: Skin is warm.  Neurological:     Mental Status: She is alert and oriented to person, place, and time.  Psychiatric:        Mood and Affect: Mood normal.        Behavior: Behavior normal.        Thought Content: Thought content normal.        Judgment: Judgment normal.    Diagnostics: FVC 2.62 L (87%), FEV1 2.16 L (82%).  Predicted FVC 3.02 L, predicted FEV1 2.64 L.  Spirometry indicates normal respiratory function.    CTA Chest (PE study) with contrast on 06/30/21 showing: "CONCLUSION:   1.  No evidence of acute pulmonary thromboembolic disease.  2.  Left lower lobe bulla"  CT Paranasal Sinuses without contrast on 07/17/21 showing:  "IMPRESSION:  Pansinusitis, as described and greatest within the bilateral  frontal, bilateral posterior ethmoid, bilateral sphenoid and  bilateral maxillary sinuses. The left frontal sinus drainage  pathway,  bilateral sphenoethmoidal recesses and right ostiomeatal  unit are obstructed. "  Assessment and Plan: 1. Not well controlled severe persistent asthma   2. Seasonal allergic rhinitis due to pollen   3. Perennial allergic rhinitis   4. Nasal polyposis   5. Anosmia   6. Allergy with anaphylaxis due to food   7. Pollen-food allergy, subsequent encounter     No orders of the defined types were placed in this encounter.   Patient Instructions   1.  Allergen avoidance measures - dust mite, cat, dog, pollen, alternaria mold, tree nut  2.  Treat and prevent inflammation:  A. Breztri -  Increase to 2 inhalations 2 times per day w/ spacer (empty lungs) B.  Stop Ryaltris - since you are starting Xhance from ENT C.  Re-start Singulair (montelukast) 10 mg - 1 tablet 1 time per day. Patient cautioned that rarely some children/adults can experience behavioral changes after beginning montelukast. These side effects are rare, however, if you notice any change, notify the clinic and discontinue montelukast. D.  We will refer you to pulmonary due to the bulla seen on your CTA chest E. Continue to consider Tezspire. Information given. Call our office and let us know if you are interested in starting Tezspire  3.  If needed:  A. Albuterol HFA - 2 inhalations every 4-6 hours  B. Cetirizine 10 mg - 1 tablet 1 time per day C. Epi-Pen, benadryl, MD/ER evaluation for allergic reaction  4.  Consider immunotherapy once your asthma is under better control  5. Return to clinic in 6 weeks with Dr. Lucie Leather or earlier if problem Return in about 6 weeks (around 09/23/2021), or if symptoms worsen or fail to improve.    Thank you for the opportunity to care for this patient.  Please do not hesitate to contact me with questions.  Nehemiah Settle, FNP [facility]

## 2021-08-16 ENCOUNTER — Telehealth: Payer: Self-pay

## 2021-08-16 NOTE — Telephone Encounter (Signed)
-----   Message from Nehemiah Settle, FNP sent at 08/12/2021  3:04 PM EDT ----- Please refer to pulmonary due to left lower lobe bulla seen on CTA chest on 06/09/21

## 2021-09-01 ENCOUNTER — Other Ambulatory Visit: Payer: Self-pay | Admitting: Internal Medicine

## 2021-09-02 ENCOUNTER — Other Ambulatory Visit: Payer: Self-pay | Admitting: *Deleted

## 2021-09-02 MED ORDER — BETAMETHASONE DIPROPIONATE 0.05 % EX LOTN: TOPICAL_LOTION | Freq: Every day | CUTANEOUS | 0 refills | Status: DC

## 2021-09-02 NOTE — Telephone Encounter (Signed)
New medication faxed to pharmacy

## 2021-09-09 ENCOUNTER — Other Ambulatory Visit: Payer: BC Managed Care – PPO

## 2021-09-10 ENCOUNTER — Other Ambulatory Visit: Payer: Self-pay | Admitting: Internal Medicine

## 2021-09-10 ENCOUNTER — Other Ambulatory Visit (INDEPENDENT_AMBULATORY_CARE_PROVIDER_SITE_OTHER): Payer: BC Managed Care – PPO

## 2021-09-10 DIAGNOSIS — F419 Anxiety disorder, unspecified: Secondary | ICD-10-CM

## 2021-09-10 DIAGNOSIS — E049 Nontoxic goiter, unspecified: Secondary | ICD-10-CM

## 2021-09-10 DIAGNOSIS — E611 Iron deficiency: Secondary | ICD-10-CM

## 2021-09-10 LAB — CBC WITH DIFFERENTIAL/PLATELET
Basophils Absolute: 0 10*3/uL (ref 0.0–0.1)
Basophils Relative: 0.4 % (ref 0.0–3.0)
Eosinophils Absolute: 0.6 10*3/uL (ref 0.0–0.7)
Eosinophils Relative: 5.1 % — ABNORMAL HIGH (ref 0.0–5.0)
HCT: 31.7 % — ABNORMAL LOW (ref 36.0–46.0)
Hemoglobin: 10.1 g/dL — ABNORMAL LOW (ref 12.0–15.0)
Lymphocytes Relative: 24.4 % (ref 12.0–46.0)
Lymphs Abs: 2.8 10*3/uL (ref 0.7–4.0)
MCHC: 31.9 g/dL (ref 30.0–36.0)
MCV: 73.4 fl — ABNORMAL LOW (ref 78.0–100.0)
Monocytes Absolute: 0.7 10*3/uL (ref 0.1–1.0)
Monocytes Relative: 6.1 % (ref 3.0–12.0)
Neutro Abs: 7.5 10*3/uL (ref 1.4–7.7)
Neutrophils Relative %: 64 % (ref 43.0–77.0)
Platelets: 335 10*3/uL (ref 150.0–400.0)
RBC: 4.32 Mil/uL (ref 3.87–5.11)
RDW: 16.5 % — ABNORMAL HIGH (ref 11.5–15.5)
WBC: 11.7 10*3/uL — ABNORMAL HIGH (ref 4.0–10.5)

## 2021-09-10 LAB — T4, FREE: Free T4: 1.09 ng/dL (ref 0.60–1.60)

## 2021-09-10 LAB — IBC + FERRITIN
Ferritin: 6.8 ng/mL — ABNORMAL LOW (ref 10.0–291.0)
Iron: 29 ug/dL — ABNORMAL LOW (ref 42–145)
Saturation Ratios: 6.2 % — ABNORMAL LOW (ref 20.0–50.0)
TIBC: 466.2 ug/dL — ABNORMAL HIGH (ref 250.0–450.0)
Transferrin: 333 mg/dL (ref 212.0–360.0)

## 2021-09-10 LAB — TSH: TSH: 1.57 u[IU]/mL (ref 0.35–5.50)

## 2021-09-19 ENCOUNTER — Encounter: Payer: Self-pay | Admitting: Pulmonary Disease

## 2021-09-19 ENCOUNTER — Ambulatory Visit (INDEPENDENT_AMBULATORY_CARE_PROVIDER_SITE_OTHER): Payer: BC Managed Care – PPO | Admitting: Pulmonary Disease

## 2021-09-19 VITALS — BP 112/74 | HR 90 | Ht 62.0 in | Wt 177.8 lb

## 2021-09-19 DIAGNOSIS — J4550 Severe persistent asthma, uncomplicated: Secondary | ICD-10-CM | POA: Diagnosis not present

## 2021-09-19 NOTE — Patient Instructions (Signed)
Severe persistent asthma: Continue taking Breztri 2 puffs twice a day Continue albuterol as needed Continue follow-up with Dr. Lucie Leather  Nasal polyposis: Continue Zyrtec and betamethaosone diproprionate as you are doing Continue follow-up with Dr. Marene Lenz  We will see you back on an as-needed basis.

## 2021-09-19 NOTE — Progress Notes (Signed)
Synopsis: Referred in August 2023 for asthma, has a history of allergic rhinitis and nasal polyposis  Subjective:   PATIENT ID: Brittany Simon GENDER: female DOB: 07-17-1995, MRN: [id number]   HPI  Chief Complaint  Patient presents with   Consult    Per patient, she was referred by PCP for asthma. Referral in the system was for abnormal CT scan in May 2023.     Brittany Simon is here to see me today because she has severe persistent asthma and an abnormal CT scan  She follows closely with Dr. Sharyn Lull and has been prescribed Markus Daft which she says she uses only once per day.  She has exacerbations frequently when the air quality is poor and sometimes when she is sick.  In December she was admitted to [facility] for 2 days for a flare and required NIMV.  It has been two months since her last exacerbation.  She hasn't been in school for two months so she wonders if this is why her symptoms are well controlled.  She was born 1 month prematurely.  At age 61 she had pneumonia.  Asthma has been problem for her since early middle school.  Initially it was seasonal and managed with singulair, claritin and albuterol helped.  However at age 84 (2020) her symptoms became persistent and were the worse in 01/2021.  She has been treated with IL-5 inhibitors and most recently IL-4/13 inhibitors with either limited success or complications (conjunctivitis).    Her grandmother, father and brother all have asthma.    She doesn't smoke.  She works at [employer].  She doesn't have any pets, no mold in her house.  Her building at work is old.   No heartburn.  She has continuous sinus congestion.  Not alergic to aspirin.   Never had unexplained fevers, chills, rash or joint aches.  Records from July 2023 visit with ENT reviewed where she was seen for nasal polyposis, had prior rhinoscopy showing polypoid mucosa.  No changes to management made at that time.    Past Medical History:  Diagnosis Date   Acute  respiratory failure with hypoxia (HCC) 01/10/2021   Allergy    Anemia 02/2018   Asthma    Infectious mononucleosis 09/26/2013   presenting with has and periorbital swelling off and on no other alarm features    Prematurity, fetus 35-36 completed weeks of gestation     4 56z 36 weeks     Family History  Problem Relation Age of Onset   Colon polyps Mother    Asthma Father    Colon cancer Neg Hx    Esophageal cancer Neg Hx    Rectal cancer Neg Hx    Stomach cancer Neg Hx      Social History   Socioeconomic History   Marital status: Single    Spouse name: Not on file   Number of children: 0   Years of education: Not on file   Highest education level: Not on file  Occupational History   Occupation: Runner, broadcasting/film/video  Tobacco Use   Smoking status: Never   Smokeless tobacco: Never  Vaping Use   Vaping Use: Never used  Substance and Sexual Activity   Alcohol use: Yes    Comment: occassionally    Drug use: No   Sexual activity: Not Currently  Other Topics Concern   Not on file  Social History Narrative   Single   Orig from [facility] of 4  One dog  IB program.    [name].   [employer] fall 58   Girls sc[employer]    Education major, spanish minor    [employer]  4th grade    Social Determinants of Radio broadcast assistant Strain: Not on file  Food Insecurity: Not on file  Transportation Needs: Not on file  Physical Activity: Not on file  Stress: Not on file  Social Connections: Not on file  Intimate Partner Violence: Not on file     No Known Allergies   Outpatient Medications Prior to Visit  Medication Sig Dispense Refill   albuterol (PROVENTIL) (2.5 MG/3ML) 0.083% nebulizer solution Take 3 mLs (2.5 mg total) by nebulization every 4 (four) hours as needed for wheezing or shortness of breath. 75 mL 2   albuterol (VENTOLIN HFA) 108 (90 Base) MCG/ACT inhaler Inhale 2 puffs into the lungs every 6 (six) hours as needed for wheezing or shortness of breath. 18 g 1    betamethasone dipropionate 0.05 % lotion Apply topically daily. SMARTSIG:Sparingly Topical Strength: 0.05 % 60 mL 0   Budeson-Glycopyrrol-Formoterol (BREZTRI AEROSPHERE) 160-9-4.8 MCG/ACT AERO Inhale 2 puffs into the lungs in the morning and at bedtime. 10.7 g 5   cetirizine (ZYRTEC) 10 MG tablet Take 1 tablet (10 mg total) by mouth 2 (two) times daily as needed for allergies (Can use an extra dose during flare ups.). 60 tablet 5   EPINEPHrine 0.3 mg/0.3 mL IJ SOAJ injection Inject 0.3 mg into the muscle as directed. 2 each 1   escitalopram (LEXAPRO) 5 MG tablet TAKE 1 TABLET (5 MG TOTAL) BY MOUTH DAILY. 30 tablet 0   montelukast (SINGULAIR) 10 MG tablet Take 1 tablet (10 mg total) by mouth at bedtime. 30 tablet 5   FASENRA PEN 30 MG/ML SOAJ Inject 30 mg into the skin every 30 (thirty) days. (Patient not taking: Reported on 08/12/2021)     fexofenadine (ALLEGRA ALLERGY) 180 MG tablet Take 1 tablet (180 mg total) by mouth daily. (Patient not taking: Reported on 08/12/2021) 1 tablet 0   fluticasone (FLONASE) 50 MCG/ACT nasal spray 2 spray each nostril qd (Patient not taking: Reported on 08/12/2021) 16 g 3   Fluticasone Propionate (XHANCE) 93 MCG/ACT EXHU Place into the nose. (Patient not taking: Reported on 08/12/2021)     Olopatadine-Mometasone (RYALTRIS) 665-25 MCG/ACT SUSP Place 2 sprays into the nose 2 (two) times daily. (Patient not taking: Reported on 08/12/2021) 29 g 5   Benralizumab SOSY 30 mg      dupilumab (DUPIXENT) prefilled syringe 300 mg      No facility-administered medications prior to visit.    Review of Systems  Constitutional:  Negative for chills, fever, malaise/fatigue and weight loss.  HENT:  Negative for congestion, nosebleeds, sinus pain and sore throat.   Eyes:  Negative for photophobia, pain and discharge.  Respiratory:  Positive for cough, shortness of breath and wheezing. Negative for hemoptysis and sputum production.   Cardiovascular:  Negative for chest pain,  palpitations, orthopnea and leg swelling.  Gastrointestinal:  Negative for abdominal pain, constipation, diarrhea, nausea and vomiting.  Genitourinary:  Negative for dysuria, frequency, hematuria and urgency.  Musculoskeletal:  Negative for back pain, joint pain, myalgias and neck pain.  Skin:  Negative for itching and rash.  Neurological:  Negative for tingling, tremors, sensory change, speech change, focal weakness, seizures, weakness and headaches.  Psychiatric/Behavioral:  Negative for memory loss, substance abuse and suicidal ideas. The patient is not nervous/anxious.       Objective:  Physical Exam   Vitals:   09/19/21 1511  BP: 112/74  Pulse: 90  SpO2: 99%  Weight: 177 lb 12.8 oz (80.6 kg)  Height: 5\' 2"  (1.575 m)    RA  Gen: well appearing, no acute distress HENT: NCAT, OP clear, neck supple without masses Eyes: PERRL, EOMi Lymph: no cervical lymphadenopathy PULM: CTA B CV: RRR, no mgr, no JVD GI: BS+, soft, nontender, no hsm Derm: no rash or skin breakdown MSK: normal bulk and tone Neuro: A&Ox4, CN II-XII intact, strength 5/5 in all 4 extremities Psyche: normal mood and affect   CBC    Component Value Date/Time   WBC 11.7 (H) 09/10/2021 1407   RBC 4.32 09/10/2021 1407   HGB 10.1 (L) 09/10/2021 1407   HGB 12.2 01/29/2021 1651   HCT 31.7 (L) 09/10/2021 1407   HCT 38.7 01/29/2021 1651   PLT 335.0 09/10/2021 1407   MCV 73.4 (L) 09/10/2021 1407   MCV 78 (L) 01/29/2021 1651   MCH 24.6 (L) 01/29/2021 1651   MCH 24.9 (L) 01/11/2021 0325   MCHC 31.9 09/10/2021 1407   RDW 16.5 (H) 09/10/2021 1407   RDW 14.6 01/29/2021 1651   LYMPHSABS 2.8 09/10/2021 1407   LYMPHSABS 3.0 01/29/2021 1651   MONOABS 0.7 09/10/2021 1407   EOSABS 0.6 09/10/2021 1407   EOSABS 0.8 (H) 01/29/2021 1651   BASOSABS 0.0 09/10/2021 1407   BASOSABS 0.1 01/29/2021 1651     Chest imaging: 07/2021 CXR lungs clear, no infiltrate 06/2021 CT angiogram chest > no PE, left lower lobe  bulla  PFT: 08/2021 Spiro> Ratio 87%, FEV1 2.16 L (82% pred)  Labs: 09/2021 CBC 600 eos cell/microliter; Hgb 10 gm/dL  Path:  Echo:  Heart Catheterization:       Assessment & Plan:   Severe persistent asthma without complication  Discussion: Ms. Tamela Gammon has severe persistent asthma with recurrent exacerbations but fortunately her symptoms have been well controlled over the last few months on her current regimen.  She is receiving outstanding care from Dr. Neldon Mc and from Dr. Fredric Dine for her asthma and nasal polyposis.  Interestingly, she recently had a CT scan of her chest performed which showed a bulla in the left lower lobe.  As a non-smoker who was born prematurely who also happens to have lab work done this year for alpha-1 antitrypsin which was negative as well as small vessel vasculitis which was also negative the explanation for the left lower lobe bulla is the fact that she was born prematurely.  I explained to her today that at this time there is little more that I can offer to the outstanding care she is receiving from our allergy and otolaryngology colleagues.  She can follow-up with [place] on an as-needed basis.  Severe persistent asthma: Continue taking Breztri 2 puffs twice a day Continue albuterol as needed Continue follow-up with Dr. Neldon Mc  Nasal polyposis: Continue Zyrtec and betamethaosone diproprionate as you are doing Continue follow-up with Dr. Fredric Dine  We will see you back on an as-needed basis.    Current Outpatient Medications:    albuterol (PROVENTIL) (2.5 MG/3ML) 0.083% nebulizer solution, Take 3 mLs (2.5 mg total) by nebulization every 4 (four) hours as needed for wheezing or shortness of breath., Disp: 75 mL, Rfl: 2   albuterol (VENTOLIN HFA) 108 (90 Base) MCG/ACT inhaler, Inhale 2 puffs into the lungs every 6 (six) hours as needed for wheezing or shortness of breath., Disp: 18 g, Rfl: 1   betamethasone dipropionate 0.05 % lotion,  Apply topically  daily. SMARTSIG:Sparingly Topical Strength: 0.05 %, Disp: 60 mL, Rfl: 0   Budeson-Glycopyrrol-Formoterol (BREZTRI AEROSPHERE) 160-9-4.8 MCG/ACT AERO, Inhale 2 puffs into the lungs in the morning and at bedtime., Disp: 10.7 g, Rfl: 5   cetirizine (ZYRTEC) 10 MG tablet, Take 1 tablet (10 mg total) by mouth 2 (two) times daily as needed for allergies (Can use an extra dose during flare ups.)., Disp: 60 tablet, Rfl: 5   EPINEPHrine 0.3 mg/0.3 mL IJ SOAJ injection, Inject 0.3 mg into the muscle as directed., Disp: 2 each, Rfl: 1   escitalopram (LEXAPRO) 5 MG tablet, TAKE 1 TABLET (5 MG TOTAL) BY MOUTH DAILY., Disp: 30 tablet, Rfl: 0   montelukast (SINGULAIR) 10 MG tablet, Take 1 tablet (10 mg total) by mouth at bedtime., Disp: 30 tablet, Rfl: 5

## 2021-09-20 NOTE — Progress Notes (Signed)
So amenia  is  lower  .  Assuming you are  taking iron supplements  Make a virtual visit   to discuss    take iron twice a day in interim

## 2021-09-20 NOTE — Progress Notes (Signed)
I mean anemia ( mssp)

## 2021-09-23 ENCOUNTER — Telehealth (INDEPENDENT_AMBULATORY_CARE_PROVIDER_SITE_OTHER): Payer: BC Managed Care – PPO | Admitting: Internal Medicine

## 2021-09-23 ENCOUNTER — Encounter: Payer: Self-pay | Admitting: Internal Medicine

## 2021-09-23 VITALS — Ht 62.0 in | Wt 175.0 lb

## 2021-09-23 DIAGNOSIS — D509 Iron deficiency anemia, unspecified: Secondary | ICD-10-CM | POA: Diagnosis not present

## 2021-09-23 NOTE — Progress Notes (Signed)
Virtual Visit via Video Note  I connected with Brittany Simon on 09/23/21 at  3:30 PM EDT by a video enabled telemedicine application and verified that I am speaking with the correct person using two identifiers. Location patient: work Nurse, adult office Persons participating in the virtual visit: patient, provider  [place]th national recommendations  regarding COVID 19 pandemic   video visit is advised over in office visit for this patient.  Patient aware  of the limitations of evaluation and management by telemedicine and  availability of in person appointments. and agreed to proceed.   HPI: Brittany Simon presents for video visit follow-up of anemia and iron deficiency. It was noted her last blood check had a drop of 1 g of hemoglobin into the 10 range.  She states that she believes the reason is that she has not been consistent in taking iron perhaps taking only 5 days out of 30 in the last month because she forgets.  She is good about taking her asthma medicine because it makes her feel better and appears to be stable at this point  Periods are reported as normal monthly 5 to 6 days heavy first day or 2 but nothing alarming.  No other history of bleeding.  She has iron pills and also iron liquid available.  Denies side effects stomachaches so far. Diet not restrictive but not much red meat.  ROS: See pertinent positives and negatives per HPI.  Past Medical History:  Diagnosis Date   Acute respiratory failure with hypoxia (HCC) 01/10/2021   Allergy    Anemia 02/2018   Asthma    Infectious mononucleosis 09/26/2013   presenting with has and periorbital swelling off and on no other alarm features    Prematurity, fetus 35-36 completed weeks of gestation     4 40z 36 weeks    History reviewed. No pertinent surgical history.  Family History  Problem Relation Age of Onset   Colon polyps Mother    Asthma Father    Colon cancer Neg Hx    Esophageal cancer Neg Hx     Rectal cancer Neg Hx    Stomach cancer Neg Hx     Social History   Tobacco Use   Smoking status: Never   Smokeless tobacco: Never  Vaping Use   Vaping Use: Never used  Substance Use Topics   Alcohol use: Yes    Comment: occassionally    Drug use: No      Current Outpatient Medications:    albuterol (PROVENTIL) (2.5 MG/3ML) 0.083% nebulizer solution, Take 3 mLs (2.5 mg total) by nebulization every 4 (four) hours as needed for wheezing or shortness of breath., Disp: 75 mL, Rfl: 2   albuterol (VENTOLIN HFA) 108 (90 Base) MCG/ACT inhaler, Inhale 2 puffs into the lungs every 6 (six) hours as needed for wheezing or shortness of breath., Disp: 18 g, Rfl: 1   betamethasone dipropionate 0.05 % lotion, Apply topically daily. SMARTSIG:Sparingly Topical Strength: 0.05 %, Disp: 60 mL, Rfl: 0   Budeson-Glycopyrrol-Formoterol (BREZTRI AEROSPHERE) 160-9-4.8 MCG/ACT AERO, Inhale 2 puffs into the lungs in the morning and at bedtime., Disp: 10.7 g, Rfl: 5   cetirizine (ZYRTEC) 10 MG tablet, Take 1 tablet (10 mg total) by mouth 2 (two) times daily as needed for allergies (Can use an extra dose during flare ups.)., Disp: 60 tablet, Rfl: 5   EPINEPHrine 0.3 mg/0.3 mL IJ SOAJ injection, Inject 0.3 mg into the muscle as directed., Disp: 2 each, Rfl: 1  escitalopram (LEXAPRO) 5 MG tablet, TAKE 1 TABLET (5 MG TOTAL) BY MOUTH DAILY., Disp: 30 tablet, Rfl: 0   montelukast (SINGULAIR) 10 MG tablet, Take 1 tablet (10 mg total) by mouth at bedtime., Disp: 30 tablet, Rfl: 5  EXAM: BP Readings from Last 3 Encounters:  09/19/21 112/74  08/12/21 108/62  07/10/21 122/81    VITALS per patient if applicable:  GENERAL: alert, oriented, appears well and in no acute distress  HEENT: atraumatic, conjunttiva clear, no obvious abnormalities on inspection of external nose and ears  NECK: normal movements of the head and neck  LUNGS: on inspection no signs of respiratory distress, breathing rate appears normal, no  obvious gross SOB, gasping or wheezing  CV: no obvious cyanosis  MS: moves all visible extremities without noticeable abnormality  PSYCH/NEURO: pleasant and cooperative, no obvious depression or anxiety, speech and thought processing grossly intact Lab Results  Component Value Date   WBC 11.7 (H) 09/10/2021   HGB 10.1 (L) 09/10/2021   HCT 31.7 (L) 09/10/2021   PLT 335.0 09/10/2021   GLUCOSE 89 02/25/2021   CHOL 175 09/02/2019   TRIG 67 09/02/2019   HDL 54 09/02/2019   LDLCALC 106 (H) 09/02/2019   ALT 20 01/11/2021   AST 19 01/11/2021   NA 137 02/25/2021   K 3.6 02/25/2021   CL 103 02/25/2021   CREATININE 0.48 02/25/2021   BUN 10 02/25/2021   CO2 26 02/25/2021   TSH 1.57 09/10/2021   HGBA1C 5.9 02/25/2021     ASSESSMENT AND PLAN:  Discussed the following assessment and plan:    ICD-10-CM   1. Iron deficiency anemia, unspecified iron deficiency anemia type  D50.9 CBC with Differential/Platelet    Iron, TIBC and Ferritin Panel     Iron deficiency anemia history of same lack of response is probably most be related to adherence. Discussed ways to move better adherence to iron supplementation at this point 1 a day over the next 2 to 3 months and recheck CBC and iron studies. Consider other options as indicated. She feels pretty well otherwise. Counseled.   Expectant management and discussion of plan and treatment with opportunity to ask questions and all were answered. The patient agreed with the plan and demonstrated an understanding of the instructions.   Advised to call back or seek  if having  further concerns  in interim. Return for lab in 2-3 months  cbcdiff and iron  ferritinIBC panel.    Berniece Andreas, MD

## 2021-09-25 ENCOUNTER — Encounter: Payer: Self-pay | Admitting: Pulmonary Disease

## 2021-09-26 NOTE — Telephone Encounter (Signed)
Mychart message sent by pt: [facility] (supporting Lupita Leash, MD) 14 hours ago (7:15 PM)   BB Hello,    I am being treated with Markus Daft for asthma. However, I am concerned because this a medication for COPD. What is your opinion on this matter? We can set up a virtual visit if need be.    Thank you.     Dr. Kendrick Fries, please advise.

## 2021-10-02 ENCOUNTER — Other Ambulatory Visit: Payer: Self-pay | Admitting: Internal Medicine

## 2021-10-08 ENCOUNTER — Ambulatory Visit: Payer: BC Managed Care – PPO | Admitting: Allergy and Immunology

## 2021-10-09 ENCOUNTER — Encounter: Payer: Self-pay | Admitting: Allergy

## 2021-10-09 ENCOUNTER — Ambulatory Visit: Payer: BC Managed Care – PPO | Admitting: Allergy

## 2021-10-09 VITALS — BP 108/52 | HR 103 | Temp 98.4°F | Resp 16

## 2021-10-09 DIAGNOSIS — T7800XA Anaphylactic reaction due to unspecified food, initial encounter: Secondary | ICD-10-CM

## 2021-10-09 DIAGNOSIS — J4550 Severe persistent asthma, uncomplicated: Secondary | ICD-10-CM | POA: Diagnosis not present

## 2021-10-09 DIAGNOSIS — J3089 Other allergic rhinitis: Secondary | ICD-10-CM

## 2021-10-09 DIAGNOSIS — J339 Nasal polyp, unspecified: Secondary | ICD-10-CM

## 2021-10-09 DIAGNOSIS — R430 Anosmia: Secondary | ICD-10-CM

## 2021-10-09 DIAGNOSIS — T781XXD Other adverse food reactions, not elsewhere classified, subsequent encounter: Secondary | ICD-10-CM

## 2021-10-09 NOTE — Patient Instructions (Addendum)
  1.  Allergen avoidance measures - dust mite, cat, dog, pollen, alternaria mold, tree nut  2.  Treat and prevent inflammation:  A. Breztri -2 puffs 1-2 times a day.  Advised to increase to twice a day if she is having more symptoms or a flare  B.  Xhance 1 spray each nostril twice a day as directed by ENT.  Advised if having more nasal congestion symptoms can increase to 2 puffs twice a day. C.  Singulair (montelukast) 10 mg - 1 tablet 1 time per day.  Advised if she is more consistent with daily dosing it might be helpful with her allergy symptom control as well   3.  If needed:  A. Albuterol HFA - 2 inhalations every 4-6 hours  B. Cetirizine 10 mg - 1 tablet 1 time per day C. Epi-Pen, benadryl, MD/ER evaluation for allergic reaction  4.  Discussed options for nasal polyp and asthma symptom control today with Nucala.  She tried Dupixent but had a side effect related to use.  Nucala could be a limited option to help manage the nasal polyps we will also controlling her asthma as well.  Provided her with an informational brochure on Nucala.  Dorothea Ogle has also been discussed previously however this would only help with her asthma control at this time.  5.Consider allergen immunotherapy once your asthma is under better control  6. Return to clinic in 3-4 months

## 2021-10-09 NOTE — Progress Notes (Signed)
Follow-up Note  RE: Brittany Simon MRN: [id number] DOB: 08-11-1995 Date of Office Visit: 10/09/2021   History of present illness: Brittany Simon is a 26 y.o. female presenting today for follow-up of asthma, allergic rhinitis with pollen food allergy, nasal polyps with anosmia, food allergy.  She was last seen in the office in 08/12/21 by our nurse practitioner [name]. Since this visit she did see Dr. Kendrick Fries in pulmonary on 09/19/2021 as she was found to have abnormal chest CT scan with a bulla present.  Per this visit the bulla is likely benign and related to her history of prematurity and there is really nothing further that needs to be done in regards to this. In regards to her asthma she is taking breztri 2 puffs once a day in the morning.  She also was recommended to restart Singulair which she does have but states that it has been difficult to remember this medication so she currently takes it when she remembers to.  She fortunately has not required albuterol use since the last visit.  She has not had any prednisone or systemic steroids since the last visit. With her nasal polyps she does see ENT and is on Xhance that she uses 1 spray twice a day.  She states they did discuss surgery with the ENT but are holding off as the results can be quite variable and there is a large history of polyps regrowing.  She states she cannot smell but she does have a sense of taste if it is sweet, bitter or sour tasting.  She would really like to have her sense of smell back.  This is affecting her quality of life.  She did try Dupixent but after the first dose and states when she woke up she had red and puffy eyes and were slightly tender.  She did not continue on Dupixent. She will take cetirizine as needed and it works the best regards to the antihistamine choices. She does have access to an epinephrine device.  She does continue to avoid tree nuts except she states she is okay with cashews.  Review of  systems: Review of Systems  Constitutional: Negative.   HENT:         See HPI  Eyes: Negative.   Respiratory: Negative.    Cardiovascular: Negative.   Gastrointestinal: Negative.   Musculoskeletal: Negative.   Skin: Negative.   Allergic/Immunologic: Negative.   Neurological: Negative.      All other systems negative unless noted above in HPI  Past medical/social/surgical/family history have been reviewed and are unchanged unless specifically indicated below.  No changes  Medication List: Current Outpatient Medications  Medication Sig Dispense Refill   albuterol (PROVENTIL) (2.5 MG/3ML) 0.083% nebulizer solution Take 3 mLs (2.5 mg total) by nebulization every 4 (four) hours as needed for wheezing or shortness of breath. 75 mL 2   albuterol (VENTOLIN HFA) 108 (90 Base) MCG/ACT inhaler Inhale 2 puffs into the lungs every 6 (six) hours as needed for wheezing or shortness of breath. 18 g 1   betamethasone dipropionate 0.05 % lotion Apply topically daily. SMARTSIG:Sparingly Topical Strength: 0.05 % 60 mL 0   Budeson-Glycopyrrol-Formoterol (BREZTRI AEROSPHERE) 160-9-4.8 MCG/ACT AERO Inhale 2 puffs into the lungs in the morning and at bedtime. 10.7 g 5   cetirizine (ZYRTEC) 10 MG tablet Take 1 tablet (10 mg total) by mouth 2 (two) times daily as needed for allergies (Can use an extra dose during flare ups.). 60 tablet 5  EPINEPHrine 0.3 mg/0.3 mL IJ SOAJ injection Inject 0.3 mg into the muscle as directed. 2 each 1   escitalopram (LEXAPRO) 5 MG tablet TAKE 1 TABLET (5 MG TOTAL) BY MOUTH DAILY. 90 tablet 0   montelukast (SINGULAIR) 10 MG tablet Take 1 tablet (10 mg total) by mouth at bedtime. 30 tablet 5   XHANCE 93 MCG/ACT EXHU Place into both nostrils.     No current facility-administered medications for this visit.     Known medication allergies: No Known Allergies   Physical examination: Blood pressure (!) 108/52, pulse (!) 103, temperature 98.4 F (36.9 C), temperature source  Temporal, resp. rate 16, last menstrual period 09/14/2021, SpO2 99 %.  General: Alert, interactive, in no acute distress. HEENT: PERRLA, TMs pearly gray, turbinates moderately edematous without discharge with visible shiny polyp in the left nares above the medial turbinate, post-pharynx non erythematous. Neck: Supple without lymphadenopathy. Lungs: Clear to auscultation without wheezing, rhonchi or rales. {no increased work of breathing. CV: Normal S1, S2 without murmurs. Abdomen: Nondistended, nontender. Skin: Warm and dry, without lesions or rashes. Extremities:  No clubbing, cyanosis or edema. Neuro:   Grossly intact.  Diagnositics/Labs:  Spirometry: FEV1: 1.76L 67%, FVC: 2.36L 78% predicted.  This is a bit reduced from her previous study in both FEV1 and FVC values. Patient reports she is quite fatigued today.  Assessment and plan: 1. Severe persistent asthma  2. Seasonal allergic rhinitis due to pollen   3. Perennial allergic rhinitis   4. Nasal polyposis   5. Anosmia   6. Allergy with anaphylaxis due to food   7. Pollen-food allergy, subsequent encounter      At this time her asthma is under good control with her current regimen.  The biggest concern at this time is her nasal polyps and anosmia.  She would really like to be able to have a better sense of smell as this is affecting her quality of life.  She did start Dupixent and had a side effect that warranted discontinuation.  She is following with ENT and has had discussion in regards to surgery and outcomes following surgery for nasal polyps.  At this time she is holding off on surgery.  I have discussed with her other biologic agents like Nucala and/or Xolair for nasal polyp control and that she likely would tolerate better than she did the Dupixent.  She will think on the Nucala option at this time.   1.  Allergen avoidance measures - dust mite, cat, dog, pollen, alternaria mold, tree nuts  2.  Treat and prevent  inflammation:  A. Breztri -2 puffs 1-2 times a day.  Advised to increase to twice a day if she is having more symptoms or a flare  B.  Xhance 1 spray each nostril twice a day as directed by ENT.  Advised if having more nasal congestion symptoms can increase to 2 puffs twice a day. C.  Singulair (montelukast) 10 mg - 1 tablet 1 time per day.  Advised if she is more consistent with daily dosing it might be helpful with her allergy symptom control as well   3.  If needed:  A. Albuterol HFA - 2 inhalations every 4-6 hours  B. Cetirizine 10 mg - 1 tablet 1 time per day C. Epi-Pen, benadryl, MD/ER evaluation for allergic reaction  4.  Discussed options for nasal polyp and asthma symptom control today with Nucala.  She tried Dupixent but had a side effect related to use.  Nucala could be  a limited option to help manage the nasal polyps we will also controlling her asthma as well.  Provided her with an informational brochure on Nucala.  Dorothea Ogle has also been discussed previously however this would only help with her asthma control at this time.  5.Consider allergen immunotherapy once your asthma is under better control  6. Return to clinic in 3-4 months  I appreciate the opportunity to take part in Veta's care. Please do not hesitate to contact me with questions.  Sincerely,   Margo Aye, MD Allergy/Immunology [facility]

## 2021-10-21 ENCOUNTER — Ambulatory Visit: Payer: BC Managed Care – PPO | Admitting: Family Medicine

## 2021-10-25 ENCOUNTER — Encounter: Payer: Self-pay | Admitting: Family Medicine

## 2021-10-25 ENCOUNTER — Ambulatory Visit (INDEPENDENT_AMBULATORY_CARE_PROVIDER_SITE_OTHER): Payer: BC Managed Care – PPO | Admitting: Family Medicine

## 2021-10-25 VITALS — BP 110/78 | HR 101 | Temp 98.3°F | Wt 179.0 lb

## 2021-10-25 DIAGNOSIS — J029 Acute pharyngitis, unspecified: Secondary | ICD-10-CM

## 2021-10-25 DIAGNOSIS — J020 Streptococcal pharyngitis: Secondary | ICD-10-CM

## 2021-10-25 DIAGNOSIS — R0981 Nasal congestion: Secondary | ICD-10-CM

## 2021-10-25 LAB — POCT RAPID STREP A (OFFICE): Rapid Strep A Screen: POSITIVE — AB

## 2021-10-25 LAB — POCT INFLUENZA A/B
Influenza A, POC: NEGATIVE
Influenza B, POC: NEGATIVE

## 2021-10-25 LAB — POC COVID19 BINAXNOW: SARS Coronavirus 2 Ag: NEGATIVE

## 2021-10-25 MED ORDER — CEFUROXIME AXETIL 500 MG PO TABS: 500.0000 mg | ORAL_TABLET | Freq: Two times a day (BID) | ORAL | 0 refills | Status: AC

## 2021-10-25 NOTE — Progress Notes (Signed)
   Subjective:    Patient ID: Brittany Simon, female    DOB: 02/21/1995, 26 y.o.   MRN: [id number]  HPI Here for 10 days of a ST and a stuffy nose. No fever or cough or body aches. Taking Tylenol.    Review of Systems  Constitutional: Negative.   HENT:  Positive for congestion and sore throat. Negative for ear pain, postnasal drip and sinus pain.   Eyes: Negative.   Respiratory: Negative.         Objective:   Physical Exam Constitutional:      Appearance: Normal appearance. She is not ill-appearing.  HENT:     Right Ear: Tympanic membrane, ear canal and external ear normal.     Left Ear: Tympanic membrane, ear canal and external ear normal.     Nose: Nose normal.     Mouth/Throat:     Pharynx: Oropharynx is clear.  Eyes:     Conjunctiva/sclera: Conjunctivae normal.  Pulmonary:     Effort: Pulmonary effort is normal.     Breath sounds: Normal breath sounds.  Lymphadenopathy:     Cervical: No cervical adenopathy.  Neurological:     Mental Status: She is alert.           Assessment & Plan:  Strep pharyngitis, treat with 10 days of Cefuroxime.  Brittany Penna, MD

## 2021-11-17 NOTE — Progress Notes (Unsigned)
No chief complaint on file.   HPI: Patient  Brittany Simon  26 y.o. comes in today for Preventive Health Care visit   Lexapro  Resp asthma   under  specilist y care  Iron defic  Rx for strep 9 22   Health Maintenance  Topic Date Due   Hepatitis C Screening  Never done   COVID-19 Vaccine (4 - Pfizer risk series) 03/17/2020   INFLUENZA VACCINE  09/03/2021   PAP-Cervical Cytology Screening  12/12/2021 (Originally 08/28/2016)   PAP SMEAR-Modifier  12/12/2021 (Originally 08/28/2016)   TETANUS/TDAP  01/09/2027   HPV VACCINES  Completed   HIV Screening  Completed   Health Maintenance Review LIFESTYLE:  Exercise:   Tobacco/ETS: Alcohol:  Sugar beverages: Sleep: Drug use: no HH of  Work:    ROS:  REST of 12 system review negative except as per HPI   Past Medical History:  Diagnosis Date   Acute respiratory failure with hypoxia ([place]) 01/10/2021   Allergy    Anemia 02/2018   Asthma    Infectious mononucleosis 09/26/2013   presenting with has and periorbital swelling off and on no other alarm features    Prematurity, fetus 35-36 completed weeks of gestation     4 40z 36 weeks    No past surgical history on file.  Family History  Problem Relation Age of Onset   Colon polyps Mother    Asthma Father    Colon cancer Neg Hx    Esophageal cancer Neg Hx    Rectal cancer Neg Hx    Stomach cancer Neg Hx     Social History   Socioeconomic History   Marital status: Single    Spouse name: Not on file   Number of children: 0   Years of education: Not on file   Highest education level: Not on file  Occupational History   Occupation: teacher  Tobacco Use   Smoking status: Never   Smokeless tobacco: Never  Vaping Use   Vaping Use: Never used  Substance and Sexual Activity   Alcohol use: Yes    Comment: occassionally    Drug use: No   Sexual activity: Not Currently  Other Topics Concern   Not on file  Social History Narrative   Single   [facility]ig [facility]  One dog      IB program.    [place].   [employer] fall 68   [employer]    Education major, spanish minor    [employer]  4th grade    Social Determinants of Radio broadcast assistant Strain: Not on file  Food Insecurity: Not on file  Transportation Needs: Not on file  Physical Activity: Not on file  Stress: Not on file  Social Connections: Not on file    Outpatient Medications Prior to Visit  Medication Sig Dispense Refill   albuterol (PROVENTIL) (2.5 MG/3ML) 0.083% nebulizer solution Take 3 mLs (2.5 mg total) by nebulization every 4 (four) hours as needed for wheezing or shortness of breath. 75 mL 2   albuterol (VENTOLIN HFA) 108 (90 Base) MCG/ACT inhaler Inhale 2 puffs into the lungs every 6 (six) hours as needed for wheezing or shortness of breath. 18 g 1   betamethasone dipropionate 0.05 % lotion Apply topically daily. SMARTSIG:Sparingly Topical Strength: 0.05 % 60 mL 0   Budeson-Glycopyrrol-Formoterol (BREZTRI AEROSPHERE) 160-9-4.8 MCG/ACT AERO Inhale 2 puffs into the lungs in the morning and at bedtime. 10.7 g  5   cetirizine (ZYRTEC) 10 MG tablet Take 1 tablet (10 mg total) by mouth 2 (two) times daily as needed for allergies (Can use an extra dose during flare ups.). 60 tablet 5   EPINEPHrine 0.3 mg/0.3 mL IJ SOAJ injection Inject 0.3 mg into the muscle as directed. 2 each 1   escitalopram (LEXAPRO) 5 MG tablet TAKE 1 TABLET (5 MG TOTAL) BY MOUTH DAILY. 90 tablet 0   montelukast (SINGULAIR) 10 MG tablet Take 1 tablet (10 mg total) by mouth at bedtime. 30 tablet 5   XHANCE 93 MCG/ACT EXHU Place into both nostrils.     No facility-administered medications prior to visit.     EXAM:  There were no vitals taken for this visit.  There is no height or weight on file to calculate BMI. Wt Readings from Last 3 Encounters:  10/25/21 179 lb (81.2 kg)  09/23/21 175 lb (79.4 kg)  09/19/21 177 lb 12.8 oz (80.6 kg)    Physical Exam: Vital signs reviewed [id number] is a  well-developed well-nourished alert cooperative    who appearsr stated age in no acute distress.  HEENT: normocephalic atraumatic , Eyes: PERRL EOM's full, conjunctiva clear, Nares: paten,t no deformity discharge or tenderness., Ears: no deformity EAC's clear TMs with normal landmarks. Mouth: clear OP, no lesions, edema.  Moist mucous membranes. Dentition in adequate repair. NECK: supple without masses, thyromegaly or bruits. CHEST/PULM:  Clear to auscultation and percussion breath sounds equal no wheeze , rales or rhonchi. No chest wall deformities or tenderness. Breast: normal by inspection . No dimpling, discharge, masses, tenderness or discharge . CV: PMI is nondisplaced, S1 S2 no gallops, murmurs, rubs. Peripheral pulses are full without delay.No JVD .  ABDOMEN: Bowel sounds normal nontender  No guard or rebound, no hepato splenomegal no CVA tenderness.  No hernia. Extremtities:  No clubbing cyanosis or edema, no acute joint swelling or redness no focal atrophy NEURO:  Oriented x3, cranial nerves 3-12 appear to be intact, no obvious focal weakness,gait within normal limits no abnormal reflexes or asymmetrical SKIN: No acute rashes normal turgor, color, no bruising or petechiae. PSYCH: Oriented, good eye contact, no obvious depression anxiety, cognition and judgment appear normal. LN: no cervical axillary inguinal adenopathy  Lab Results  Component Value Date   WBC 11.7 (H) 09/10/2021   HGB 10.1 (L) 09/10/2021   HCT 31.7 (L) 09/10/2021   PLT 335.0 09/10/2021   GLUCOSE 89 02/25/2021   CHOL 175 09/02/2019   TRIG 67 09/02/2019   HDL 54 09/02/2019   LDLCALC 106 (H) 09/02/2019   ALT 20 01/11/2021   AST 19 01/11/2021   NA 137 02/25/2021   K 3.6 02/25/2021   CL 103 02/25/2021   CREATININE 0.48 02/25/2021   BUN 10 02/25/2021   CO2 26 02/25/2021   TSH 1.57 09/10/2021   HGBA1C 5.9 02/25/2021    BP Readings from Last 3 Encounters:  10/25/21 110/78  10/09/21 (!) 108/52  09/19/21 112/74     Lab results reviewed with patient   ASSESSMENT AND PLAN:  Discussed the following assessment and plan:    ICD-10-CM   1. Visit for preventive health examination  Z00.00     2. Medication management  Z79.899     3. Iron deficiency anemia, unspecified iron deficiency anemia type  D50.9     4. Anxiety  F41.9      No follow-ups on file.  Patient Care Team: Burnis Medin, MD as PCP - General There are no  Patient Instructions on file for this visit.  Standley Brooking. Shina Wass M.D.

## 2021-11-18 ENCOUNTER — Ambulatory Visit (INDEPENDENT_AMBULATORY_CARE_PROVIDER_SITE_OTHER): Payer: BC Managed Care – PPO | Admitting: Internal Medicine

## 2021-11-18 ENCOUNTER — Encounter: Payer: Self-pay | Admitting: Internal Medicine

## 2021-11-18 VITALS — BP 100/80 | HR 97 | Temp 97.7°F | Ht 63.75 in | Wt 176.8 lb

## 2021-11-18 DIAGNOSIS — J069 Acute upper respiratory infection, unspecified: Secondary | ICD-10-CM | POA: Diagnosis not present

## 2021-11-18 DIAGNOSIS — Z79899 Other long term (current) drug therapy: Secondary | ICD-10-CM | POA: Diagnosis not present

## 2021-11-18 DIAGNOSIS — D509 Iron deficiency anemia, unspecified: Secondary | ICD-10-CM | POA: Diagnosis not present

## 2021-11-18 DIAGNOSIS — Z23 Encounter for immunization: Secondary | ICD-10-CM

## 2021-11-18 DIAGNOSIS — Z0000 Encounter for general adult medical examination without abnormal findings: Secondary | ICD-10-CM | POA: Diagnosis not present

## 2021-11-18 DIAGNOSIS — F419 Anxiety disorder, unspecified: Secondary | ICD-10-CM

## 2021-11-18 DIAGNOSIS — J4550 Severe persistent asthma, uncomplicated: Secondary | ICD-10-CM

## 2021-11-18 LAB — CBC WITH DIFFERENTIAL/PLATELET
Basophils Absolute: 0.1 10*3/uL (ref 0.0–0.1)
Basophils Relative: 1 % (ref 0.0–3.0)
Eosinophils Absolute: 0.7 10*3/uL (ref 0.0–0.7)
Eosinophils Relative: 5.8 % — ABNORMAL HIGH (ref 0.0–5.0)
HCT: 32.6 % — ABNORMAL LOW (ref 36.0–46.0)
Hemoglobin: 10.6 g/dL — ABNORMAL LOW (ref 12.0–15.0)
Lymphocytes Relative: 26.1 % (ref 12.0–46.0)
Lymphs Abs: 3 10*3/uL (ref 0.7–4.0)
MCHC: 32.3 g/dL (ref 30.0–36.0)
MCV: 73 fl — ABNORMAL LOW (ref 78.0–100.0)
Monocytes Absolute: 0.8 10*3/uL (ref 0.1–1.0)
Monocytes Relative: 6.8 % (ref 3.0–12.0)
Neutro Abs: 6.9 10*3/uL (ref 1.4–7.7)
Neutrophils Relative %: 60.3 % (ref 43.0–77.0)
Platelets: 383 10*3/uL (ref 150.0–400.0)
RBC: 4.47 Mil/uL (ref 3.87–5.11)
RDW: 16.5 % — ABNORMAL HIGH (ref 11.5–15.5)
WBC: 11.4 10*3/uL — ABNORMAL HIGH (ref 4.0–10.5)

## 2021-11-18 LAB — BASIC METABOLIC PANEL
BUN: 10 mg/dL (ref 6–23)
CO2: 26 mEq/L (ref 19–32)
Calcium: 9.2 mg/dL (ref 8.4–10.5)
Chloride: 103 mEq/L (ref 96–112)
Creatinine, Ser: 0.48 mg/dL (ref 0.40–1.20)
GFR: 130.9 mL/min (ref >60.00)
Glucose, Bld: 87 mg/dL (ref 70–99)
Potassium: 3.5 mEq/L (ref 3.5–5.1)
Sodium: 138 mEq/L (ref 135–145)

## 2021-11-18 LAB — HEPATIC FUNCTION PANEL
ALT: 8 U/L (ref 0–35)
AST: 13 U/L (ref 0–37)
Albumin: 4.2 g/dL (ref 3.5–5.2)
Alkaline Phosphatase: 58 U/L (ref 39–117)
Bilirubin, Direct: 0 mg/dL (ref 0.0–0.3)
Total Bilirubin: 0.4 mg/dL (ref 0.2–1.2)
Total Protein: 7.7 g/dL (ref 6.0–8.3)

## 2021-11-18 LAB — LIPID PANEL
Cholesterol: 172 mg/dL (ref 0–200)
HDL: 41.2 mg/dL (ref >39.00)
LDL Cholesterol: 107 mg/dL — ABNORMAL HIGH (ref 0–99)
NonHDL: 130.34
Total CHOL/HDL Ratio: 4
Triglycerides: 118 mg/dL (ref 0.0–149.0)
VLDL: 23.6 mg/dL (ref 0.0–40.0)

## 2021-11-18 LAB — TSH: TSH: 1.69 u[IU]/mL (ref 0.35–5.50)

## 2021-11-18 MED ORDER — AZITHROMYCIN 250 MG PO TABS: ORAL_TABLET | ORAL | 0 refills | Status: AC

## 2021-11-18 MED ORDER — ESCITALOPRAM OXALATE 10 MG PO TABS: 10.0000 mg | ORAL_TABLET | Freq: Every day | ORAL | 1 refills | Status: DC

## 2021-11-18 NOTE — Patient Instructions (Signed)
Ok to increase lexapro 10 mg a day .  As discussed and then FU virtual ok  after 2-3 cycles .  Continue iron as possible  anc checking for anemia today.  Lungs are clear and suspect the respiratory infliction is viral ,prob from the kids  , and at this time close observation and fu if worsening . Cough can lat 2-3 weeks. But should be getting better  Written rx for antibiotic in case needed if worsening  or as per your asthma specialist .   Flu shot today .

## 2021-11-19 LAB — IRON,TIBC AND FERRITIN PANEL
%SAT: 8 % (calc) — ABNORMAL LOW (ref 16–45)
Ferritin: 4 ng/mL — ABNORMAL LOW (ref 16–154)
Iron: 31 ug/dL — ABNORMAL LOW (ref 40–190)
TIBC: 377 mcg/dL (calc) (ref 250–450)

## 2021-11-20 NOTE — Progress Notes (Signed)
Me is still there iron deficiency try to make a bigger effort to take iron on a regular basis to catch up with your anemia Kidney and liver function thyroid are normal Cholesterol is acceptable  Repeat another CBC in 3 months diagnosis anemia

## 2021-11-21 ENCOUNTER — Other Ambulatory Visit: Payer: Self-pay

## 2021-11-21 DIAGNOSIS — D649 Anemia, unspecified: Secondary | ICD-10-CM

## 2021-12-24 ENCOUNTER — Telehealth: Payer: Self-pay

## 2021-12-24 NOTE — Telephone Encounter (Signed)
--  Caller states she has a sinus infection and went to urgent care yesterday. Received Prednisone and Amoxicillin PO. Pt still has a headache and wants to know if she can still take NSAID for headache. Patient denies fever.  12/23/2021 4:53:18 PM Home Care Webb Silversmith, RN, Marylu Lund  Comments User: Alanson Puls, RN Date/Time Lamount Cohen Time): 12/23/2021 4:47:12 PM HA pain 7 on scale of 1/10  Care Advice Given Per Guideline HOME CARE: * You should be able to treat this at home. REASSURANCE AND EDUCATION - BACTERIAL SINUSITIS AND TAKING AN ANTIBIOTIC: * Most bacterial infections do not get better after the first dose of an antibiotic. * Often there is no improvement the first day. * People gradually get better over 2 to 3 days. * Here is some care advice that should help. CONTINUE ANTIBIOTIC: * Continue the prescribed antibiotic. * Also, continue any other treatment instructions from your doctor (or NP/PA). PAIN MEDICINES: * For pain relief, you can take either acetaminophen, ibuprofen, or naproxen. * IBUPROFEN (E.G., MOTRIN, ADVIL): Take 400 mg (two 200 mg pills) by mouth every 6 hours. The most you should take is 6 pills a day (1,200 mg total). * Introduction: Saline (salt water) nasal irrigation (nasal wash) is an effective and simple home remedy for treating stuffy nose and sinus congestion. The nose can be irrigated by pouring, spraying, or squirting salt water into the nose and then letting it run back out. NASAL WASHES FOR A STUFFY NOSE: CALL BACK IF: * Difficulty breathing (and not relieved by cleaning out nose) * Fever lasts over 2 days on antibiotics * Symptoms don't improve by day 4 on antibiotics * You become worse CARE ADVICE given per Sinus Infection on Antibiotic Follow-Up Call (Adult) guideline.

## 2022-01-12 NOTE — Patient Instructions (Incomplete)
  1.  Allergen avoidance measures - dust mite, cat, dog, pollen, alternaria mold, tree nut  2.  Treat and prevent inflammation:  A. Breztri -2 puffs 2 times a day.   BTimmothy Sours 1 spray each nostril twice a day as directed by ENT.  Advised if having more nasal congestion symptoms can increase to 2 puffs twice a day. C.  Singulair (montelukast) 10 mg - 1 tablet 1 time per day.  Advised if she is more consistent with daily dosing it might be helpful with her allergy symptom control as well.  Refill sent.  3.  If needed:  A. Albuterol HFA - 2 inhalations every 4-6 hours  B. Cetirizine 10 mg - 1 tablet 1 time per day C. Epi-Pen, benadryl, MD/ER evaluation for allergic reaction  4.  Discussed options for nasal polyp and asthma symptom control today with Nucala.  She tried Dupixent but had a side effect related to use.  Nucala could be a limited option to help manage the nasal polyps we will also controlling her asthma as well.  She will look online about information for Nucala and will call us if she is interested in starting this.  Dorothea Ogle has also been discussed previously however this would only help with her asthma control at this time.  5.Consider allergen immunotherapy once your asthma is under better control  6. Return to clinic in 6 weeks or sooner needed

## 2022-01-13 ENCOUNTER — Other Ambulatory Visit: Payer: Self-pay

## 2022-01-13 ENCOUNTER — Encounter: Payer: Self-pay | Admitting: Family

## 2022-01-13 ENCOUNTER — Ambulatory Visit (INDEPENDENT_AMBULATORY_CARE_PROVIDER_SITE_OTHER): Payer: BC Managed Care – PPO | Admitting: Family

## 2022-01-13 DIAGNOSIS — T7819XD Other adverse food reactions, not elsewhere classified, subsequent encounter: Secondary | ICD-10-CM

## 2022-01-13 DIAGNOSIS — J301 Allergic rhinitis due to pollen: Secondary | ICD-10-CM | POA: Diagnosis not present

## 2022-01-13 DIAGNOSIS — J4550 Severe persistent asthma, uncomplicated: Secondary | ICD-10-CM | POA: Diagnosis not present

## 2022-01-13 DIAGNOSIS — T7800XA Anaphylactic reaction due to unspecified food, initial encounter: Secondary | ICD-10-CM

## 2022-01-13 DIAGNOSIS — J339 Nasal polyp, unspecified: Secondary | ICD-10-CM

## 2022-01-13 DIAGNOSIS — T781XXD Other adverse food reactions, not elsewhere classified, subsequent encounter: Secondary | ICD-10-CM

## 2022-01-13 DIAGNOSIS — J3089 Other allergic rhinitis: Secondary | ICD-10-CM

## 2022-01-13 DIAGNOSIS — R430 Anosmia: Secondary | ICD-10-CM

## 2022-01-13 MED ORDER — MONTELUKAST SODIUM 10 MG PO TABS: 10.0000 mg | ORAL_TABLET | Freq: Every day | ORAL | 5 refills | Status: DC

## 2022-01-13 NOTE — Progress Notes (Signed)
RE: Brittany Simon MRN: [id number] DOB: April 22, 1995 Date of Telemedicine Visit: 01/13/2022  Referring provider: Madelin Headings, MD Primary care provider: Madelin Headings, MD  Chief Complaint: Asthma (Patient gave verbal consent to treat and bill insurance for this visit. )   Telemedicine Follow Up Visit via Telephone: I connected with Brittany Simon for a follow up on 01/13/22 by telephone and verified that I am speaking with the correct person using two identifiers.   I discussed the limitations, risks, security and privacy concerns of performing an evaluation and management service by telephone and the availability of in person appointments. I also discussed with the patient that there may be a patient responsible charge related to this service. The patient expressed understanding and agreed to proceed.  Patient is at home.  Provider is at the office.  Visit start time: 3:47 PM Visit end time: 4:07 PM Insurance consent/check in by: Brittany Simon Medical consent and medical assistant/nurse: Brittany Simon.  History of Present Illness: She is a 26 y.o. female, who is being followed for severe persistent asthma, seasonal allergic rhinitis due to pollen, perennial allergic rhinitis, nasal polyposis, anosmia, allergy with anaphylaxis due to food, and pollen food allergy. Her previous allergy office visit was on October 09, 2021 with Brittany Simon.   Severe persistent asthma: She reports slight wheezing always when she wakes up in the morning.  She also has coughing more than usual.  She denies tightness in chest, shortness of breath, and nocturnal awakenings due to breathing problems.  Since her last office visit she has not required any systemic steroids or made any trips to the emergency room or urgent care due to breathing problems.  She uses her albuterol inhaler approximately once or twice a week.  She was on a round of steroids the week before Thanksgiving due to a sinus infection.  She uses her  Breztri 2 puffs twice a day and has not been taking Singulair 10 mg consistently.  She also has albuterol to use as needed.  Discussed the options of Nucala which could help nasal polyps and asthma.  She cannot remember if she got information about Nucala at her last office visit.  Offered to mail information or she could look online.  She reports that she will look online for information about Nucala.  Also discussed Tezspire, but discussed that this would only help with her asthma control.  Seasonal and perennial allergic rhinitis.  She reports clear rhinorrhea and nasal congestion all the time.  She denies postnasal drip.  She has had 1 sinus infection the week before Thanksgiving where she received an antibiotic and a steroid.  She takes Zyrtec every other day and uses Xhance 1 spray each nostril once a day or sometimes twice a day if she can remember.  Nasal polyposis: She reports that she has a follow-up with ear nose and throat at the beginning of the year.  She is not able to taste or smell right now, but mentions this comes and goes.  She has tried Dupixent in the past for her nasal polyps, but it caused her to have pinkeye so she stopped.  She continues to avoid tree nuts without any accidental ingestion or use of her EpiPen.  She reports that her EpiPen is up-to-date.  Assessment and Plan: Versa is a 26 y.o. female with: Patient Instructions   1.  Allergen avoidance measures - dust mite, cat, dog, pollen, alternaria mold, tree nut  2.  Treat and prevent  inflammation:  A. Breztri -2 puffs 2 times a day.   BTimmothy Sours 1 spray each nostril twice a day as directed by ENT.  Advised if having more nasal congestion symptoms can increase to 2 puffs twice a day. C.  Singulair (montelukast) 10 mg - 1 tablet 1 time per day.  Advised if she is more consistent with daily dosing it might be helpful with her allergy symptom control as well.  Refill sent.  3.  If needed:  A. Albuterol HFA - 2  inhalations every 4-6 hours  B. Cetirizine 10 mg - 1 tablet 1 time per day C. Epi-Pen, benadryl, MD/ER evaluation for allergic reaction  4.  Discussed options for nasal polyp and asthma symptom control today with Nucala.  She tried Dupixent but had a side effect related to use.  Nucala could be a limited option to help manage the nasal polyps we will also controlling her asthma as well.  She will look online about information for Nucala and will call us if she is interested in starting this.  Dorothea Ogle has also been discussed previously however this would only help with her asthma control at this time.  5.Consider allergen immunotherapy once your asthma is under better control  6. Return to clinic in 6 weeks or sooner needed  Return in about 6 weeks (around 02/24/2022), or if symptoms worsen or fail to improve.  Meds ordered this encounter  Medications   montelukast (SINGULAIR) 10 MG tablet    Sig: Take 1 tablet (10 mg total) by mouth at bedtime.    Dispense:  30 tablet    Refill:  5   Lab Orders  No laboratory test(s) ordered today    Diagnostics: None.  Medication List:  Current Outpatient Medications  Medication Sig Dispense Refill   albuterol (VENTOLIN HFA) 108 (90 Base) MCG/ACT inhaler Inhale 2 puffs into the lungs every 6 (six) hours as needed for wheezing or shortness of breath. 18 g 1   betamethasone dipropionate 0.05 % lotion Apply topically daily. SMARTSIG:Sparingly Topical Strength: 0.05 % 60 mL 0   Budeson-Glycopyrrol-Formoterol (BREZTRI AEROSPHERE) 160-9-4.8 MCG/ACT AERO Inhale 2 puffs into the lungs in the morning and at bedtime. 10.7 g 5   cetirizine (ZYRTEC) 10 MG tablet Take 1 tablet (10 mg total) by mouth 2 (two) times daily as needed for allergies (Can use an extra dose during flare ups.). 60 tablet 5   EPINEPHrine 0.3 mg/0.3 mL IJ SOAJ injection Inject 0.3 mg into the muscle as directed. 2 each 1   escitalopram (LEXAPRO) 10 MG tablet Take 1 tablet (10 mg total) by  mouth daily. Dosage adjustment 90 tablet 1   XHANCE 93 MCG/ACT EXHU Place into both nostrils.     albuterol (PROVENTIL) (2.5 MG/3ML) 0.083% nebulizer solution Take 3 mLs (2.5 mg total) by nebulization every 4 (four) hours as needed for wheezing or shortness of breath. 75 mL 2   montelukast (SINGULAIR) 10 MG tablet Take 1 tablet (10 mg total) by mouth at bedtime. 30 tablet 5   No current facility-administered medications for this visit.   Allergies: No Known Allergies I reviewed her past medical history, social history, family history, and environmental history and no significant changes have been reported from previous visit on timbre 07/2021.  Review of Systems  Constitutional:  Negative for chills and fever.  HENT:         Orts clear rhinorrhea and nasal congestion.  She reports she is not able to taste or smell  right now.  She mentions that this comes and goes.  She denies postnasal drip.  Eyes:        Denies itchy watery eyes  Respiratory:  Positive for cough and wheezing. Negative for chest tightness and shortness of breath.        Reports wheezing daily in the morning and coughing more than usual.  Denies tightness in chest, shortness of breath, and nocturnal awakenings due to breathing problems  Cardiovascular:  Negative for chest pain and palpitations.  Gastrointestinal:        Denies heartburn or reflux symptoms  Genitourinary:  Negative for frequency.  Skin:        Denies rash or itchy skin  Allergic/Immunologic: Positive for food allergies.  Neurological:  Negative for headaches.   Objective: Physical Exam Not obtained as encounter was done via telephone.   Previous notes and tests were reviewed.  I discussed the assessment and treatment plan with the patient. The patient was provided an opportunity to ask questions and all were answered. The patient agreed with the plan and demonstrated an understanding of the instructions.   The patient was advised to call back or seek  an in-person evaluation if the symptoms worsen or if the condition fails to improve as anticipated.  I provided 20 minutes of non-face-to-face time during this encounter.  It was my pleasure to participate in [place] Kalter's care today. Please feel free to contact me with any questions or concerns.   Sincerely,  Nehemiah Settle, FNP

## 2022-02-21 NOTE — Patient Instructions (Addendum)
  1.  Allergen avoidance measures - dust mite, cat, dog, pollen, alternaria mold, tree nut  2.  Treat and prevent inflammation:  A.  Increase Breztri -2 puffs 2 times a day with spacer to help prevent cough and wheeze.   BTruett Perna 1 spray each nostril twice a day as directed by ENT.  Advised if having more nasal congestion symptoms can increase to 2 puffs twice a day. After speaking with your ENT let us know if you are interested in starting a biologic C.  Singulair (montelukast) 10 mg - 1 tablet 1 time per day. Try to take this Simon day for better symptoms control  3.  If needed:  A. Albuterol HFA - 2 inhalations Simon 4-6 hours  B. Cetirizine 10 mg - 1 tablet 1 time per day C. Epi-Pen, benadryl, MD/ER evaluation for allergic reaction  4.  Discussed options for nasal polyp and asthma symptom control today with Nucala.  She tried Dupixent but had a side effect related to use.  Nucala could be a limited option to help manage the nasal polyps we will also controlling her asthma as well.  Brittany Simon has also been discussed previously however this would only help with her asthma control at this time.  5.Consider allergen immunotherapy once your asthma is under better control  6. Return to clinic in 2-3 months or sooner needed

## 2022-02-24 ENCOUNTER — Encounter: Payer: Self-pay | Admitting: Family

## 2022-02-24 ENCOUNTER — Other Ambulatory Visit: Payer: Self-pay

## 2022-02-24 ENCOUNTER — Ambulatory Visit: Payer: BC Managed Care – PPO | Admitting: Family

## 2022-02-24 VITALS — BP 108/70 | HR 89 | Temp 98.3°F | Resp 16 | Ht 63.0 in | Wt 179.2 lb

## 2022-02-24 DIAGNOSIS — J339 Nasal polyp, unspecified: Secondary | ICD-10-CM

## 2022-02-24 DIAGNOSIS — J4550 Severe persistent asthma, uncomplicated: Secondary | ICD-10-CM | POA: Diagnosis not present

## 2022-02-24 DIAGNOSIS — J3089 Other allergic rhinitis: Secondary | ICD-10-CM

## 2022-02-24 DIAGNOSIS — T781XXD Other adverse food reactions, not elsewhere classified, subsequent encounter: Secondary | ICD-10-CM

## 2022-02-24 DIAGNOSIS — J301 Allergic rhinitis due to pollen: Secondary | ICD-10-CM

## 2022-02-24 DIAGNOSIS — T7800XA Anaphylactic reaction due to unspecified food, initial encounter: Secondary | ICD-10-CM

## 2022-02-24 MED ORDER — ALBUTEROL SULFATE HFA 108 (90 BASE) MCG/ACT IN AERS: INHALATION_SPRAY | RESPIRATORY_TRACT | 1 refills | Status: DC

## 2022-02-24 MED ORDER — EPINEPHRINE 0.3 MG/0.3ML IJ SOAJ: 0.3000 mg | INTRAMUSCULAR | 1 refills | Status: DC

## 2022-02-24 MED ORDER — BREZTRI AEROSPHERE 160-9-4.8 MCG/ACT IN AERO: INHALATION_SPRAY | RESPIRATORY_TRACT | 5 refills | Status: DC

## 2022-02-24 MED ORDER — CETIRIZINE HCL 10 MG PO TABS: ORAL_TABLET | ORAL | 5 refills | Status: AC

## 2022-02-24 NOTE — Progress Notes (Signed)
Brittany Simon [id number] Dept: [phone]  FOLLOW UP NOTE  Patient ID: Brittany Simon, female    DOB: 1995-10-23  Age: 27 y.o. MRN: [id number] Date of Office Visit: 02/24/2022  Assessment  Chief Complaint: Follow-up; Severe persistent asthma, uncomplicated; and Wheezing  HPI Brittany Simon is a 27 year old female who presents today for follow-up of severe persistent asthma, seasonal and perennial allergic rhinitis, food allergy, and nasal polyps.  She was last seen on January 13, 2022 via televisit by myself.  She denies any new diagnosis or surgeries since we last saw her.  Asthma: She is currently only using Breztri 1 puff in the morning and 2 puffs at night because she does not feel like she needs the second puff in the morning right now.  She also does not take her Singulair every day, but takes it as often as she can remember.  She reports that she has to stop and catch her breath when going from outside to inside or inside to outside.  She has noticed that cold weather affects her breathing.  She denies cough, wheeze, tightness in chest, and nocturnal awakenings due to breathing problems.  She has used her albuterol once or twice over the past week.  Since her last office visit she has not required any systemic steroids or made any trips to the emergency room or urgent care due to breathing problems.  Seasonal and perennial allergic rhinitis/nasal polyps: She currently takes Singulair 10 mg once a day as often as she can remember.  She uses Xhance nasal spray as needed and Zyrtec almost every day.  She feels like Zyrtec works the best.  She has tried Dupixent in the past for nasal polyps, but this caused pinkeye.  Discussed again that she had an option of Nucala which could help both her nasal polyp and her asthma.  Also discussed that Cheron Every has been mentioned in the past and this would only help with asthma.  She has an upcoming appointment soon with ear nose and throat and  would like to see what she says about Biologics before deciding.  Right now she is not able to taste or smell.  She has not had any sinus infections since we last saw her.  Food Food allergy she continues to avoid tree nuts without any accidental ingestion or use of her epinephrine autoinjector device.  She does report that her EpiPen is up-to-date and she knows how to use it.   Drug Allergies:  No Known Allergies  Review of Systems: Review of Systems  Constitutional:  Negative for chills and fever.  HENT:         Reports clear rhinorrhea and nasal congestion. She has post nasal drip sometimes. She is not able to taste and smell  Eyes:        Denies itchy watery eyes  Respiratory:  Positive for shortness of breath. Negative for cough and wheezing.        Reports that she will have to catch her breath after going outside to inside or  inside to outside.  She notices that cold weather affects her breathing more. Denies cough, wheeze, tightness in chest and nocturnal awakenings due to breathing problems  Cardiovascular:  Negative for chest pain and palpitations.  Gastrointestinal:        Denies heartburn or reflux symptoms  Genitourinary:  Negative for frequency.  Skin:  Negative for itching.  Neurological:  Negative for headaches.  Endo/Heme/Allergies:  Positive for  environmental allergies.     Physical Exam: BP 108/70   Pulse 89   Temp 98.3 F (36.8 C) (Temporal)   Resp 16   Ht 5\' 3"  (1.6 m)   Wt 179 lb 3.2 oz (81.3 kg)   SpO2 97%   BMI 31.74 kg/m    Physical Exam Constitutional:      Appearance: Normal appearance.  HENT:     Head: Normocephalic and atraumatic.     Comments: Pharynx normal, eyes normal, ears normal, nose: Bilateral lower turbinates moderately edematous and pale with clear drainage noted    Right Ear: Tympanic membrane, ear canal and external ear normal.     Left Ear: Tympanic membrane, ear canal and external ear normal.     Mouth/Throat:     Mouth:  Mucous membranes are moist.     Pharynx: Oropharynx is clear.  Eyes:     Conjunctiva/sclera: Conjunctivae normal.  Cardiovascular:     Rate and Rhythm: Normal rate and regular rhythm.     Heart sounds: Normal heart sounds.  Pulmonary:     Effort: Pulmonary effort is normal.     Breath sounds: Normal breath sounds.     Comments: Lungs clear to auscultation Musculoskeletal:     Cervical back: Neck supple.  Skin:    General: Skin is warm.  Neurological:     Mental Status: She is alert and oriented to person, place, and time.  Psychiatric:        Mood and Affect: Mood normal.        Behavior: Behavior normal.        Thought Content: Thought content normal.        Judgment: Judgment normal.     Diagnostics: FVC 2.62 L (83%), FEV1 1.69 L (62%).  Predicted FVC 3.14 L, predicted FEV1 2.72 L.  Spirometry indicates mild airway obstruction.  Assessment and Plan: 1. Severe persistent asthma, uncomplicated   2. Nasal polyposis   3. Perennial allergic rhinitis   4. Seasonal allergic rhinitis due to pollen   5. Allergy with anaphylaxis due to food   6. Pollen-food allergy, subsequent encounter     Meds ordered this encounter  Medications   EPINEPHrine 0.3 mg/0.3 mL IJ SOAJ injection    Sig: Inject 0.3 mg into the muscle as directed.    Dispense:  2 each    Refill:  1   Budeson-Glycopyrrol-Formoterol (BREZTRI AEROSPHERE) 160-9-4.8 MCG/ACT AERO    Sig: Inhale 2 puffs twice a day with spacer to help prevent cough and wheeze    Dispense:  10.7 g    Refill:  5   albuterol (VENTOLIN HFA) 108 (90 Base) MCG/ACT inhaler    Sig: Inhale 2 puffs every 6 hours as needed for cough, wheeze, tightness in chest, or shortness of breath    Dispense:  18 g    Refill:  1   cetirizine (ZYRTEC) 10 MG tablet    Sig: Take 1 tablet once a day as needed for runny nose    Dispense:  30 tablet    Refill:  5    Patient Instructions   1.  Allergen avoidance measures - dust mite, cat, dog, pollen,  alternaria mold, tree nut  2.  Treat and prevent inflammation:  A.  Increase Breztri -2 puffs 2 times a day with spacer to help prevent cough and wheeze.   B 1 spray each nostril twice a day as directed by ENT.  Advised if having more nasal congestion symptoms  can increase to 2 puffs twice a day. After speaking with your ENT let us know if you are interested in starting a biologic C.  Singulair (montelukast) 10 mg - 1 tablet 1 time per day. Try to take this every day for better symptoms control  3.  If needed:  A. Albuterol HFA - 2 inhalations every 4-6 hours  B. Cetirizine 10 mg - 1 tablet 1 time per day C. Epi-Pen, benadryl, MD/ER evaluation for allergic reaction  4.  Discussed options for nasal polyp and asthma symptom control today with Nucala.  She tried Dupixent but had a side effect related to use.  Nucala could be a limited option to help manage the nasal polyps we will also controlling her asthma as well.  Cheron Every has also been discussed previously however this would only help with her asthma control at this time.  5.Consider allergen immunotherapy once your asthma is under better control  6. Return to clinic in 2-3 months or sooner needed  Return in about 3 months (around 05/26/2022), or if symptoms worsen or fail to improve.    Thank you for the opportunity to care for this patient.  Please do not hesitate to contact me with questions.  Althea Charon, FNP [facility]

## 2022-02-25 NOTE — Addendum Note (Signed)
Addended by: Chip Boer R on: 02/25/2022 07:15 PM   Modules accepted: Orders

## 2022-05-28 ENCOUNTER — Other Ambulatory Visit: Payer: Self-pay | Admitting: Internal Medicine

## 2022-05-30 ENCOUNTER — Ambulatory Visit (INDEPENDENT_AMBULATORY_CARE_PROVIDER_SITE_OTHER): Payer: BC Managed Care – PPO | Admitting: Allergy

## 2022-05-30 ENCOUNTER — Encounter: Payer: Self-pay | Admitting: Allergy

## 2022-05-30 VITALS — BP 112/72 | HR 112 | Temp 98.7°F | Resp 16

## 2022-05-30 DIAGNOSIS — T7800XD Anaphylactic reaction due to unspecified food, subsequent encounter: Secondary | ICD-10-CM

## 2022-05-30 DIAGNOSIS — J4550 Severe persistent asthma, uncomplicated: Secondary | ICD-10-CM

## 2022-05-30 DIAGNOSIS — T7800XA Anaphylactic reaction due to unspecified food, initial encounter: Secondary | ICD-10-CM

## 2022-05-30 DIAGNOSIS — J339 Nasal polyp, unspecified: Secondary | ICD-10-CM

## 2022-05-30 DIAGNOSIS — J3089 Other allergic rhinitis: Secondary | ICD-10-CM | POA: Diagnosis not present

## 2022-05-30 NOTE — Patient Instructions (Addendum)
  1.  Allergen avoidance measures - dust mite, cat, dog, pollen, alternaria mold, tree nut  2.  Treat and prevent inflammation:  A.  Breztri -2 puffs 1 times a day with spacer B.  Xhance nasal spray as directed by ENT.   C.  Singulair (montelukast) 10 mg daily D.  Zyrtec 10mg  daily  3.  If needed:  A. Albuterol HFA - 2 inhalations every 4-6 hours  C. Epi-Pen, benadryl, MD/ER evaluation for allergic reaction  4.  We can re-discuss nasal polyp/asthma control injectable medications in future if needed for control.  Nucala and Tezspire may be good options if needed  5.  Consider allergen immunotherapy for your environmental allergens if allergy medication is not effective enough  6. Return to clinic in 6 months or sooner needed

## 2022-05-30 NOTE — Progress Notes (Signed)
Follow-up Note  RE: Brittany Simon MRN: [id number] DOB: September 14, 1995 Date of Office Visit: 05/30/2022   History of present illness: Brittany Simon is a 27 y.o. female presenting today for follow-up of asthma, nasal polyposis, allergic rhinitis and food allergy.  She was last seen in the office on 02/24/2022 by our nurse practitioner [name].  She will be having nasal polyp removal in June with her ENT.  She is hopeful that this will provide long-lasting relief.  She states her grandmother had nasal polyp removal done and she has not had both of her polyps.  She is currently using Xhance 1 spray each nostril twice a day and using it as needed.  She feels like her asthma control is "fine".  She's states she "hardly ever" needs to use her albuterol inhaler.   She did use it today at work however as it was 'fun day at work" with a lot of activities.  She only needed to use 1 puff with good relief.  She has not had any ED or urgent care visits or any systemic steroid needs.  It has been about a year since she has needed systemic steroids.  She recalls doing a neb treatment in the beginnings of March as the seasons were changes.  Patient denies any nighttime awakenings at this time. She takes Zyrtec, Singulair and Breztri 2 puffs in the morning that is helpful for both asthma and allergy symptom control.   She has epipen for tree nut allergy; she has not required use of this.   Review of systems: Review of Systems  Constitutional: Negative.   HENT:  Positive for congestion.   Eyes: Negative.   Respiratory:  Positive for shortness of breath.        Today at work as per HPI  Cardiovascular: Negative.   Gastrointestinal: Negative.   Musculoskeletal: Negative.   Skin: Negative.   Allergic/Immunologic: Negative.   Neurological: Negative.      All other systems negative unless noted above in HPI  Past medical/social/surgical/family history have been reviewed and are unchanged unless specifically  indicated below.  No changes  Medication List: Current Outpatient Medications  Medication Sig Dispense Refill   albuterol (VENTOLIN HFA) 108 (90 Base) MCG/ACT inhaler Inhale 2 puffs every 6 hours as needed for cough, wheeze, tightness in chest, or shortness of breath 18 g 1   betamethasone dipropionate 0.05 % lotion Apply topically daily. SMARTSIG:Sparingly Topical Strength: 0.05 % 60 mL 0   Budeson-Glycopyrrol-Formoterol (BREZTRI AEROSPHERE) 160-9-4.8 MCG/ACT AERO Inhale 2 puffs twice a day with spacer to help prevent cough and wheeze 10.7 g 5   cetirizine (ZYRTEC) 10 MG tablet Take 1 tablet once a day as needed for runny nose 30 tablet 5   EPINEPHrine 0.3 mg/0.3 mL IJ SOAJ injection Inject 0.3 mg into the muscle as directed. 2 each 1   escitalopram (LEXAPRO) 10 MG tablet TAKE 1 TABLET (10 MG TOTAL) BY MOUTH DAILY. DOSAGE ADJUSTMENT 90 tablet 1   montelukast (SINGULAIR) 10 MG tablet Take 1 tablet (10 mg total) by mouth at bedtime. 30 tablet 5   XHANCE 93 MCG/ACT EXHU Place 2 sprays into both nostrils as needed.     albuterol (PROVENTIL) (2.5 MG/3ML) 0.083% nebulizer solution Take 3 mLs (2.5 mg total) by nebulization every 4 (four) hours as needed for wheezing or shortness of breath. 75 mL 2   No current facility-administered medications for this visit.     Known medication allergies: Allergies  Allergen Reactions  Dupilumab Other (See Comments)    Pink eye     Physical examination: Blood pressure 112/72, pulse (!) 112, temperature 98.7 F (37.1 C), temperature source Temporal, resp. rate 16, SpO2 95 %.  General: Alert, interactive, in no acute distress. HEENT: PERRLA, TMs pearly gray, turbinates moderately edematous with clear discharge; L nostril with shiny polyp at level of middle turbinate, post-pharynx non erythematous. Neck: Supple without lymphadenopathy. Lungs: Clear to auscultation without wheezing, rhonchi or rales. {no increased work of breathing. CV: Normal S1, S2  without murmurs. Abdomen: Nondistended, nontender. Skin: Warm and dry, without lesions or rashes. Extremities:  No clubbing, cyanosis or edema. Neuro:   Grossly intact.  Diagnositics/Labs: None today   Assessment and plan: Severe persistent asthma-under good control  Nasal polyposis-symptomatic with polyp removal in June  Allergic rhinitis-controlled with Zyrtec, Singulair and Xhance  Allergy with anaphylaxis due to food      1.  Allergen avoidance measures - dust mite, cat, dog, pollen, alternaria mold, tree nut  2.  Treat and prevent inflammation:  A.  Breztri -2 puffs 1 times a day with spacer B.  Xhance nasal spray as directed by ENT.   C.  Singulair (montelukast) 10 mg daily D.  Zyrtec 10mg  daily  3.  If needed:  A. Albuterol HFA - 2 inhalations every 4-6 hours  C. Epi-Pen, benadryl, MD/ER evaluation for allergic reaction  4.  We can re-discuss nasal polyp/asthma control injectable medications in future if needed for control.  Nucala and Tezspire may be good options if needed  5.  Consider allergen immunotherapy for your environmental allergens if allergy medication is not effective enough  6. Return to clinic in 6 months or sooner needed  I appreciate the opportunity to take part in Zurri's care. Please do not hesitate to contact me with questions.  Sincerely,   Margo Aye, MD Allergy/Immunology [facility]

## 2022-07-08 NOTE — Progress Notes (Signed)
     Your surgery and Pre-Admission testing visit will be at [facility] located at [address], [place], [place] [address].  Please let all your doctors (i.e., Primary Care Physician, Cardiologist, Endocrinologist, Pulmonologist) know you are having surgery. You may need clearance for surgery. If you are on blood thinners, notify your surgeon and ask the doctor who prescribed them how long to hold them before surgery.  If you have had a heart test, such as an EKG, stress test, heart ultrasound, etc., or lab work performed outside of [facility], please bring copies of these tests to your Pre-Admission testing, if possible.  These departments may contact you before the day of surgery:  Pre-Service Center - insurance/ billing: [phone] Pharmacy- to review your medications: [phone] Pre-Admission Testing- to set an appointment for your visit: [phone]  (Often, these numbers show up as "SPAM" on your phone)  The Pre-Admission Testing (PAT) visit focuses on Anesthesia for your upcoming surgery.  You do NOT need to fast; take your medications as usual. Please arrive 30 minutes early to allow for parking and admitting.  The visit may last up to an hour. Bring a photo ID and medical insurance card. Reschedule if you are sick. ([phone]) and please, NO children under age 16 at the visit.  During the PAT visit:  We will review your medical and surgical history.   You will receive pre-operative instructions, including the time of arrival at the hospital and surgical start time.  We will review what medication(s) you can take on the day of surgery.  After speaking with the nurse, you will have blood drawn and, if needed, a chest x-ray and EKG.  Most lab results from your doctor are good for 30 days, Hemoglobin A1C is good for 60 days. If you cannot talk to the Pharmacy, bring your medications or a list of them to the PST visit.   Infection control for the [facility] requires: All fingernail and toenail products should be removed before the day of surgery.  (SNS, Acrylic, Gel, Polish, Stickers, Press on, and Poly gel nails.)   Parking information:  Address: [facility] - [address], [place],  [address]  Please look for signs for entrance A off of [address]. Free valet parking is available Monday-Friday 05:30am-06:00pm     

## 2022-07-10 ENCOUNTER — Other Ambulatory Visit: Payer: Self-pay | Admitting: Otolaryngology

## 2022-07-10 NOTE — Progress Notes (Signed)
Surgical Instructions    Your procedure is scheduled on Friday July 18, 2022.  Report to [facility] Main Entrance "A" at 6:45 A.M., then check in with the Admitting office.  Call this number if you have problems the morning of surgery:  [phone]   If you have any questions prior to your surgery date call [phone]: Open Monday-Friday 8am-4pm If you experience any cold or flu symptoms such as cough, fever, chills, shortness of breath, etc. between now and your scheduled surgery, please notify us at the above number     Remember:  Do not eat after midnight the night before your surgery  You may drink clear liquids until 5:45 the morning of your surgery.   Clear liquids allowed are: Water, Non-Citrus Juices (without pulp), Carbonated Beverages, Clear Tea, Black Coffee ONLY (NO MILK, CREAM OR POWDERED CREAMER of any kind), and Gatorade    Take these medicines the morning of surgery with A SIP OF WATER:   BREZTRI AEROSPHERE: please bring with you the day of surgery.  escitalopram (LEXAPRO)   If needed: albuterol nebulizer solution  albuterol inhaler : Please bring inhaler day of surgery. cetirizine (ZYRTEC)  XHANCE Nasal spray  As of today, STOP taking any Aspirin (unless otherwise instructed by your surgeon) Aleve, Naproxen, Ibuprofen, Motrin, Advil, Goody's, BC's, all herbal medications, fish oil, and all vitamins.  Special instructions:    Oral Hygiene is also important to reduce your risk of infection.  Remember - BRUSH YOUR TEETH THE MORNING OF SURGERY WITH YOUR REGULAR TOOTHPASTE   [facility]- Preparing For Surgery  Before surgery, you can play an important role. Because skin is not sterile, your skin needs to be as free of germs as possible. You can reduce the number of germs on your skin by washing with CHG (chlorahexidine gluconate) Soap before surgery.  CHG is an antiseptic cleaner which kills germs and bonds with the skin to continue killing germs even after  washing.     Please do not use if you have an allergy to CHG or antibacterial soaps. If your skin becomes reddened/irritated stop using the CHG.  Do not shave (including legs and underarms) for at least 48 hours prior to first CHG shower. It is OK to shave your face.  Please follow these instructions carefully.     Shower the NIGHT BEFORE SURGERY and the MORNING OF SURGERY with CHG Soap.   If you chose to wash your hair, wash your hair first as usual with your normal shampoo. After you shampoo, rinse your hair and body thoroughly to remove the shampoo.  Then [employer] and genitals (private parts) with your normal soap and rinse thoroughly to remove soap.  After that Use CHG Soap as you would any other liquid soap. You can apply CHG directly to the skin and wash gently with a scrungie or a clean washcloth.   Apply the CHG Soap to your body ONLY FROM THE NECK DOWN.  Do not use on open wounds or open sores. Avoid contact with your eyes, ears, mouth and genitals (private parts). Wash Face and genitals (private parts)  with your normal soap.   Wash thoroughly, paying special attention to the area where your surgery will be performed.  Thoroughly rinse your body with warm water from the neck down.  DO NOT shower/wash with your normal soap after using and rinsing off the CHG Soap.  Pat yourself dry with a CLEAN TOWEL.  Wear CLEAN PAJAMAS to bed the night before  surgery  Place CLEAN SHEETS on your bed the night before your surgery  DO NOT SLEEP WITH PETS.   Day of Surgery:  Take a shower with CHG soap. Wear Clean/Comfortable clothing the morning of surgery Do not apply any deodorants/lotions.   Remember to brush your teeth WITH YOUR REGULAR TOOTHPASTE.  Do not wear jewelry or makeup. Do not wear lotions, powders, perfumes/cologne or deodorant. Do not shave 48 hours prior to surgery.  Men may shave face and neck. Do not bring valuables to the hospital. Do not wear nail polish, gel  polish, artificial nails, or any other type of covering on natural nails (fingers and toes) If you have artificial nails or gel coating that need to be removed by a nail salon, please have this removed prior to surgery. Artificial nails or gel coating may interfere with anesthesia's ability to adequately monitor your vital signs.  [facility] is not responsible for any belongings or valuables.    Do NOT Smoke (Tobacco/Vaping)  24 hours prior to your procedure  If you use a CPAP at night, you may bring your mask for your overnight stay.   Contacts, glasses, hearing aids, dentures or partials may not be worn into surgery, please bring cases for these belongings   For patients admitted to the hospital, discharge time will be determined by your treatment team.   Patients discharged the day of surgery will not be allowed to drive home, and someone needs to stay with them for 24 hours.   SURGICAL WAITING ROOM VISITATION Patients having surgery or a procedure may have no more than 2 support people in the waiting area - these visitors may rotate.   Children under the age of 60 must have an adult with them who is not the patient. If the patient needs to stay at the hospital during part of their recovery, the visitor guidelines for inpatient rooms apply. Pre-op nurse will coordinate an appropriate time for 1 support person to accompany patient in pre-op.  This support person may not rotate.   Please refer to [email or web address] for the visitor guidelines for Inpatients (after your surgery is over and you are in a regular room).   If you received a COVID test during your pre-op visit, it is requested that you wear a mask when out in public, stay away from anyone that may not be feeling well, and notify your surgeon if you develop symptoms. If you have been in contact with anyone that has tested positive in the last 10 days, please notify your  surgeon.    Please read over the following fact sheets that you were given.

## 2022-07-11 ENCOUNTER — Encounter (HOSPITAL_COMMUNITY)
Admission: RE | Admit: 2022-07-11 | Discharge: 2022-07-11 | Disposition: A | Discharge status: Home / Self Care | Payer: BC Managed Care – PPO | Source: Ambulatory Visit | Attending: Otolaryngology | Admitting: Otolaryngology

## 2022-07-11 ENCOUNTER — Other Ambulatory Visit: Payer: Self-pay

## 2022-07-11 ENCOUNTER — Encounter (HOSPITAL_COMMUNITY): Payer: Self-pay

## 2022-07-11 VITALS — BP 114/79 | HR 93 | Temp 98.5°F | Resp 18 | Ht 62.0 in | Wt 175.9 lb

## 2022-07-11 DIAGNOSIS — J324 Chronic pansinusitis: Secondary | ICD-10-CM | POA: Insufficient documentation

## 2022-07-11 DIAGNOSIS — B9689 Other specified bacterial agents as the cause of diseases classified elsewhere: Secondary | ICD-10-CM | POA: Diagnosis not present

## 2022-07-11 DIAGNOSIS — J339 Nasal polyp, unspecified: Secondary | ICD-10-CM | POA: Insufficient documentation

## 2022-07-11 DIAGNOSIS — J45909 Unspecified asthma, uncomplicated: Secondary | ICD-10-CM | POA: Insufficient documentation

## 2022-07-11 DIAGNOSIS — Z01818 Encounter for other preprocedural examination: Secondary | ICD-10-CM

## 2022-07-11 DIAGNOSIS — Z01812 Encounter for preprocedural laboratory examination: Secondary | ICD-10-CM | POA: Diagnosis present

## 2022-07-11 LAB — CBC
HCT: 31.9 % — ABNORMAL LOW (ref 36.0–46.0)
Hemoglobin: 9.8 g/dL — ABNORMAL LOW (ref 12.0–15.0)
MCH: 22.1 pg — ABNORMAL LOW (ref 26.0–34.0)
MCHC: 30.7 g/dL (ref 30.0–36.0)
MCV: 72 fL — ABNORMAL LOW (ref 80.0–100.0)
Platelets: 374 10*3/uL (ref 150–400)
RBC: 4.43 MIL/uL (ref 3.87–5.11)
RDW: 16.5 % — ABNORMAL HIGH (ref 11.5–15.5)
WBC: 13.7 10*3/uL — ABNORMAL HIGH (ref 4.0–10.5)
nRBC: 0 % (ref 0.0–0.2)

## 2022-07-11 NOTE — Progress Notes (Signed)
PCP - Dr. Berniece Andreas Cardiologist - denies  PPM/ICD - denies  Chest x-ray - 07/10/2021 EKG - n/a Stress Test -  ECHO -  Cardiac Cath -   Sleep Study - Denies CPAP - denies  Non-diabetic  Blood Thinner Instructions: denies Aspirin Instructions:denies  ERAS Protcol - Yes, clear fluids until 0545 PRE-SURGERY Ensure or G2- No  COVID TEST- n/a  Anesthesia review: No  Patient denies shortness of breath, fever, cough and chest pain at PAT appointment   All instructions explained to the patient, with a verbal understanding of the material. Patient agrees to go over the instructions while at home for a better understanding. Patient also instructed to self quarantine after being tested for COVID-19. The opportunity to ask questions was provided.

## 2022-07-14 NOTE — Progress Notes (Signed)
Anesthesia Chart Review:  Case: [id number] Date/Time: 07/18/22 0830   Procedures:      SINUS ENDO WITH FUSION (Bilateral)     TURBINATE REDUCTION/SUBMUCOSAL RESECTION (Bilateral)     SEPTOPLASTY (Bilateral)   Anesthesia type: General   Pre-op diagnosis:      Nasal polyp, unspecified     Allergic rhinitis, unspecified     Chronic pansinusitis   [facility]: [facility] ROOM 09 / [facility] OR   Surgeons: Skotnicki, Meghan A, DO       DISCUSSION: Patient is a 27 year old female scheduled for the above procedure.  History includes never smoker, asthma, nasal polyposis, allergic rhinitis, food allergies, anemia.   Last allergy and asthma follow-up with Dr. Delorse Lek was on 05/30/22 and notes plans for nasal surgery with ENT. Patient felt she was doing "fine" with her asthma than and was rarely having to use her albuterol inhaler. She takes Zyrtec, Singulair and Breztri 2 puffs in the morning that is helpful for both asthma and allergy symptom control.  She has as needed Xhance nasal spray, albuterol MDI, and Epipen for tree nut allergy. Six month follow-up planned.    Preoperative labs showed WBC 13.7, H/H 9.8/31.0, PLT 374. Previously HGB 10.1-11.2 and HCT 31.7-34.9 in 2023. MCV 72.0, previously 73-77.2 in 2023. Last office visit with PCP Dr. Fabian Sharp was on 11/18/21, and she encouraged patient to "make a bigger effort to take iron on a regular basis to catch up with your anemia." Repeat CBC had been planned. LMP 06/20/22.  Dr. Marene Lenz has reviewed labs. She is asking patient to contact her PCP for recommendations. Although H/H a little lower than in 2023, it does not appear to be a significant drop. 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 Will defer any preoperative recommendations and timing of anemia follow-up to Dr. Marene Lenz and/or primary care.   VS: BP 114/79   Pulse 93   Temp 36.9 C (Oral)   Resp 18   Ht 5\' 2"  (1.575 m)   Wt 79.8 kg   LMP 06/20/2022 (Approximate)   SpO2 98%   BMI 32.17 kg/m     PROVIDERS: Panosh, Neta Mends, MD is PCP  Margo Aye, MD is allergist   LABS: See DISCUSSION. (all labs ordered are listed, but only abnormal results are displayed)  Labs Reviewed  CBC - Abnormal; Notable for the following components:      Result Value   WBC 13.7 (*)    Hemoglobin 9.8 (*)    HCT 31.9 (*)    MCV 72.0 (*)    MCH 22.1 (*)    RDW 16.5 (*)    All other components within normal limits     IMAGES: CT Paranasal Sinuses 04/06/22 ([facility]): IMPRESSION: Confluent opacification of the paranasal sinuses. History of nasal  polyps with confluent opacification of the bilateral upper nasal  cavity. Rightward nasal septal deviation.    CXR 07/10/21: FINDINGS: The heart size and mediastinal contours are within normal limits. Both lungs are clear. The visualized skeletal structures are unremarkable. IMPRESSION: No active cardiopulmonary disease.   EKG: N/A EKG 06/29/21 ([facility], in setting of likely asthma exacerbation requiring steroids): Per Narrative in Care Everywhere: Sinus tachycardia  at 133 bpm Nonspecific ST-T changes  No previous ECGs available  Confirmed by Graylin Shiver  31  on 07-01-2021 9 14 08 AM   EKG 01/09/21: ST at 118 bpm   CV: N/A  Past Medical History:  Diagnosis Date   Acute respiratory failure with hypoxia (  HCC) 01/10/2021   Allergy    Anemia 02/2018   Asthma    Infectious mononucleosis 09/26/2013   presenting with has and periorbital swelling off and on no other alarm features    Prematurity, fetus 35-36 completed weeks of gestation     4 40z 36 weeks    Past Surgical History:  Procedure Laterality Date   NO PAST SURGERIES      MEDICATIONS:  albuterol (PROVENTIL) (2.5 MG/3ML) 0.083% nebulizer solution   albuterol (VENTOLIN HFA) 108 (90 Base) MCG/ACT inhaler   betamethasone dipropionate 0.05 % lotion   Budeson-Glycopyrrol-Formoterol (BREZTRI AEROSPHERE) 160-9-4.8 MCG/ACT AERO   cetirizine (ZYRTEC) 10 MG tablet    EPINEPHrine 0.3 mg/0.3 mL IJ SOAJ injection   escitalopram (LEXAPRO) 10 MG tablet   montelukast (SINGULAIR) 10 MG tablet   XHANCE 93 MCG/ACT EXHU   No current facility-administered medications for this encounter.    Shonna Chock, PA-C Surgical Short Stay/Anesthesiology [facility] Phone [phone] [facility] Phone [phone] 07/14/2022 10:00 AM

## 2022-07-15 NOTE — Anesthesia Preprocedure Evaluation (Addendum)
Anesthesia Evaluation  Patient identified by MRN, date of birth, ID band Patient awake    Reviewed: Allergy & Precautions, H&P , NPO status , Patient's Chart, lab work & pertinent test results  Airway Mallampati: I  TM Distance: >3 FB Neck ROM: Full    Dental no notable dental hx. (+) Teeth Intact, Dental Advisory Given   Pulmonary asthma    Pulmonary exam normal breath sounds clear to auscultation       Cardiovascular negative cardio ROS  Rhythm:Regular Rate:Normal     Neuro/Psych  Headaches  Anxiety        GI/Hepatic negative GI ROS, Neg liver ROS,,,  Endo/Other  negative endocrine ROS    Renal/GU negative Renal ROS  negative genitourinary   Musculoskeletal   Abdominal   Peds  Hematology  (+) Blood dyscrasia, anemia   Anesthesia Other Findings   Reproductive/Obstetrics negative OB ROS                             Anesthesia Physical Anesthesia Plan  ASA: 2  Anesthesia Plan: General   Post-op Pain Management: Tylenol PO (pre-op)*   Induction: Intravenous  PONV Risk Score and Plan: 4 or greater and Ondansetron, Dexamethasone and Midazolam  Airway Management Planned: Oral ETT  Additional Equipment:   Intra-op Plan:   Post-operative Plan: Extubation in OR  Informed Consent: I have reviewed the patients History and Physical, chart, labs and discussed the procedure including the risks, benefits and alternatives for the proposed anesthesia with the patient or authorized representative who has indicated his/her understanding and acceptance.     Dental advisory given  Plan Discussed with: CRNA and Surgeon  Anesthesia Plan Comments: (PAT note written by Shonna Chock, PA-C. For T&S on the day of surgery per Dr. Marene Lenz request.   )       Anesthesia Quick Evaluation

## 2022-07-18 ENCOUNTER — Ambulatory Visit (HOSPITAL_COMMUNITY): Payer: BC Managed Care – PPO | Admitting: Vascular Surgery

## 2022-07-18 ENCOUNTER — Ambulatory Visit (HOSPITAL_COMMUNITY)
Admission: RE | Admit: 2022-07-18 | Discharge: 2022-07-18 | Disposition: A | Discharge status: Home / Self Care | Payer: BC Managed Care – PPO | Attending: Otolaryngology | Admitting: Otolaryngology

## 2022-07-18 ENCOUNTER — Encounter (HOSPITAL_COMMUNITY)
Admission: RE | Disposition: A | Discharge status: Home / Self Care | Payer: Self-pay | Source: Home / Self Care | Attending: Otolaryngology

## 2022-07-18 ENCOUNTER — Other Ambulatory Visit (HOSPITAL_COMMUNITY): Payer: Self-pay

## 2022-07-18 ENCOUNTER — Other Ambulatory Visit: Payer: Self-pay

## 2022-07-18 DIAGNOSIS — J339 Nasal polyp, unspecified: Secondary | ICD-10-CM | POA: Diagnosis not present

## 2022-07-18 DIAGNOSIS — F419 Anxiety disorder, unspecified: Secondary | ICD-10-CM | POA: Diagnosis not present

## 2022-07-18 DIAGNOSIS — D649 Anemia, unspecified: Secondary | ICD-10-CM

## 2022-07-18 DIAGNOSIS — J45909 Unspecified asthma, uncomplicated: Secondary | ICD-10-CM | POA: Insufficient documentation

## 2022-07-18 DIAGNOSIS — J324 Chronic pansinusitis: Secondary | ICD-10-CM | POA: Diagnosis present

## 2022-07-18 DIAGNOSIS — Z01818 Encounter for other preprocedural examination: Secondary | ICD-10-CM

## 2022-07-18 HISTORY — PX: SINUS ENDO WITH FUSION: SHX5329

## 2022-07-18 HISTORY — PX: NASAL TURBINATE REDUCTION: SHX2072

## 2022-07-18 HISTORY — PX: SEPTOPLASTY WITH ETHMOIDECTOMY, AND MAXILLARY ANTROSTOMY: SHX6090

## 2022-07-18 HISTORY — PX: GASTROC RECESSION EXTREMITY: SHX6262

## 2022-07-18 HISTORY — PX: SEPTOPLASTY: SHX2393

## 2022-07-18 HISTORY — PX: SPHENOIDECTOMY: SHX2421

## 2022-07-18 HISTORY — PX: TURBINATE REDUCTION: SHX6157

## 2022-07-18 HISTORY — PX: POLYPECTOMY: SHX149

## 2022-07-18 LAB — ABO/RH: ABO/RH(D): A POS

## 2022-07-18 LAB — TYPE AND SCREEN
ABO/RH(D): A POS
Antibody Screen: NEGATIVE

## 2022-07-18 LAB — POCT PREGNANCY, URINE: Preg Test, Ur: NEGATIVE

## 2022-07-18 SURGERY — SURGERY, PARANASAL SINUS, ENDOSCOPIC, WITH NASAL SEPTOPLASTY, TURBINOPLASTY, AND MAXILLARY SINUSOTOMY
Anesthesia: General | Site: Nose | Laterality: Bilateral

## 2022-07-18 MED ORDER — SODIUM CHLORIDE 0.9 % IR SOLN
Status: DC | PRN
  Administered 2022-07-18: 1000 mL

## 2022-07-18 MED ORDER — DEXAMETHASONE SODIUM PHOSPHATE 10 MG/ML IJ SOLN
INTRAMUSCULAR | Status: DC | PRN
  Administered 2022-07-18: 10 mg via INTRAVENOUS

## 2022-07-18 MED ORDER — OXYCODONE HCL 5 MG/5ML PO SOLN
ORAL | Status: AC
  Filled 2022-07-18: qty 5

## 2022-07-18 MED ORDER — LACTATED RINGERS IV SOLN: INTRAVENOUS | Status: DC

## 2022-07-18 MED ORDER — PROPOFOL 10 MG/ML IV BOLUS
INTRAVENOUS | Status: AC
  Filled 2022-07-18: qty 20

## 2022-07-18 MED ORDER — DEXAMETHASONE SODIUM PHOSPHATE 10 MG/ML IJ SOLN
INTRAMUSCULAR | Status: AC
  Filled 2022-07-18: qty 1

## 2022-07-18 MED ORDER — HYDROCODONE-ACETAMINOPHEN 5-325 MG PO TABS
1.0000 | ORAL_TABLET | ORAL | 0 refills | Status: AC | PRN
  Filled 2022-07-18: qty 30, 5d supply, fill #0

## 2022-07-18 MED ORDER — ACETAMINOPHEN 500 MG PO TABS: 1000.0000 mg | ORAL_TABLET | Freq: Once | ORAL | Status: AC

## 2022-07-18 MED ORDER — PHENYLEPHRINE 80 MCG/ML (10ML) SYRINGE FOR IV PUSH (FOR BLOOD PRESSURE SUPPORT)
PREFILLED_SYRINGE | INTRAVENOUS | Status: DC | PRN
  Administered 2022-07-18 (×5): 80 ug via INTRAVENOUS

## 2022-07-18 MED ORDER — ROCURONIUM BROMIDE 10 MG/ML (PF) SYRINGE
PREFILLED_SYRINGE | INTRAVENOUS | Status: AC
  Filled 2022-07-18: qty 10

## 2022-07-18 MED ORDER — HYDROMORPHONE HCL 1 MG/ML IJ SOLN
INTRAMUSCULAR | Status: AC
  Filled 2022-07-18: qty 1

## 2022-07-18 MED ORDER — 0.9 % SODIUM CHLORIDE (POUR BTL) OPTIME
TOPICAL | Status: DC | PRN
  Administered 2022-07-18: 1000 mL

## 2022-07-18 MED ORDER — LIDOCAINE 2% (20 MG/ML) 5 ML SYRINGE
INTRAMUSCULAR | Status: DC | PRN
  Administered 2022-07-18: 80 mg via INTRAVENOUS

## 2022-07-18 MED ORDER — LIDOCAINE 2% (20 MG/ML) 5 ML SYRINGE
INTRAMUSCULAR | Status: AC
  Filled 2022-07-18: qty 5

## 2022-07-18 MED ORDER — SUCCINYLCHOLINE CHLORIDE 200 MG/10ML IV SOSY
PREFILLED_SYRINGE | INTRAVENOUS | Status: AC
  Filled 2022-07-18: qty 10

## 2022-07-18 MED ORDER — FENTANYL CITRATE (PF) 250 MCG/5ML IJ SOLN
INTRAMUSCULAR | Status: DC | PRN
  Administered 2022-07-18 (×2): 50 ug via INTRAVENOUS
  Administered 2022-07-18: 100 ug via INTRAVENOUS
  Administered 2022-07-18: 50 ug via INTRAVENOUS

## 2022-07-18 MED ORDER — DEXMEDETOMIDINE HCL IN NACL 80 MCG/20ML IV SOLN
INTRAVENOUS | Status: DC | PRN
  Administered 2022-07-18: 8 ug via INTRAVENOUS

## 2022-07-18 MED ORDER — CEFADROXIL 500 MG PO CAPS
500.0000 mg | ORAL_CAPSULE | Freq: Two times a day (BID) | ORAL | 0 refills | Status: AC
  Filled 2022-07-18: qty 20, 10d supply, fill #0

## 2022-07-18 MED ORDER — SUGAMMADEX SODIUM 200 MG/2ML IV SOLN
INTRAVENOUS | Status: DC | PRN
  Administered 2022-07-18: 200 mg via INTRAVENOUS

## 2022-07-18 MED ORDER — CHLORHEXIDINE GLUCONATE 0.12 % MT SOLN
15.0000 mL | Freq: Once | OROMUCOSAL | Status: AC
  Administered 2022-07-18: 15 mL via OROMUCOSAL
  Filled 2022-07-18: qty 15

## 2022-07-18 MED ORDER — BACITRACIN ZINC 500 UNIT/GM EX OINT
TOPICAL_OINTMENT | CUTANEOUS | Status: AC
  Filled 2022-07-18: qty 28.35

## 2022-07-18 MED ORDER — OXYCODONE HCL 5 MG/5ML PO SOLN
5.0000 mg | Freq: Once | ORAL | Status: AC
  Administered 2022-07-18: 5 mg via ORAL

## 2022-07-18 MED ORDER — EPINEPHRINE HCL (NASAL) 0.1 % NA SOLN
NASAL | Status: DC | PRN
  Administered 2022-07-18: 20 mL via NASAL

## 2022-07-18 MED ORDER — FENTANYL CITRATE (PF) 250 MCG/5ML IJ SOLN
INTRAMUSCULAR | Status: AC
  Filled 2022-07-18: qty 5

## 2022-07-18 MED ORDER — LIDOCAINE-EPINEPHRINE 1 %-1:100000 IJ SOLN
INTRAMUSCULAR | Status: DC | PRN
  Administered 2022-07-18: 14 mL

## 2022-07-18 MED ORDER — ROCURONIUM BROMIDE 10 MG/ML (PF) SYRINGE
PREFILLED_SYRINGE | INTRAVENOUS | Status: DC | PRN
  Administered 2022-07-18: 70 mg via INTRAVENOUS

## 2022-07-18 MED ORDER — EPINEPHRINE HCL (NASAL) 0.1 % NA SOLN
NASAL | Status: AC
  Filled 2022-07-18: qty 30

## 2022-07-18 MED ORDER — CEFAZOLIN SODIUM-DEXTROSE 2-4 GM/100ML-% IV SOLN
2.0000 g | INTRAVENOUS | Status: AC
  Administered 2022-07-18: 2 g via INTRAVENOUS
  Filled 2022-07-18: qty 100

## 2022-07-18 MED ORDER — ORAL CARE MOUTH RINSE: 15.0000 mL | Freq: Once | OROMUCOSAL | Status: AC

## 2022-07-18 MED ORDER — OXYMETAZOLINE HCL 0.05 % NA SOLN
NASAL | Status: AC
  Filled 2022-07-18: qty 30

## 2022-07-18 MED ORDER — MUPIROCIN 2 % EX OINT
TOPICAL_OINTMENT | CUTANEOUS | Status: DC | PRN
  Administered 2022-07-18: 1 via NASAL

## 2022-07-18 MED ORDER — HEMOSTATIC AGENTS (NO CHARGE) OPTIME
TOPICAL | Status: DC | PRN
  Administered 2022-07-18 (×2): 1 via TOPICAL

## 2022-07-18 MED ORDER — PROPOFOL 10 MG/ML IV BOLUS
INTRAVENOUS | Status: DC | PRN
  Administered 2022-07-18: 150 mg via INTRAVENOUS

## 2022-07-18 MED ORDER — PHENYLEPHRINE HCL-NACL 20-0.9 MG/250ML-% IV SOLN: INTRAVENOUS | Status: DC | PRN

## 2022-07-18 MED ORDER — MIDAZOLAM HCL 2 MG/2ML IJ SOLN
INTRAMUSCULAR | Status: DC | PRN
  Administered 2022-07-18: 2 mg via INTRAVENOUS

## 2022-07-18 MED ORDER — ONDANSETRON HCL 4 MG/2ML IJ SOLN
INTRAMUSCULAR | Status: AC
  Filled 2022-07-18: qty 2

## 2022-07-18 MED ORDER — ONDANSETRON HCL 4 MG/2ML IJ SOLN
INTRAMUSCULAR | Status: DC | PRN
  Administered 2022-07-18: 4 mg via INTRAVENOUS

## 2022-07-18 MED ORDER — MIDAZOLAM HCL 2 MG/2ML IJ SOLN
INTRAMUSCULAR | Status: AC
  Filled 2022-07-18: qty 2

## 2022-07-18 MED ORDER — PREDNISONE 10 MG PO TABS
ORAL_TABLET | ORAL | 0 refills | Status: AC
  Filled 2022-07-18: qty 30, 12d supply, fill #0

## 2022-07-18 MED ORDER — ACETAMINOPHEN 500 MG PO TABS
ORAL_TABLET | ORAL | Status: AC
  Administered 2022-07-18: 1000 mg via ORAL
  Filled 2022-07-18: qty 2

## 2022-07-18 MED ORDER — HYDROMORPHONE HCL 1 MG/ML IJ SOLN
0.2500 mg | INTRAMUSCULAR | Status: DC | PRN
  Administered 2022-07-18 (×2): 0.25 mg via INTRAVENOUS

## 2022-07-18 MED ORDER — LIDOCAINE-EPINEPHRINE 1 %-1:100000 IJ SOLN
INTRAMUSCULAR | Status: AC
  Filled 2022-07-18: qty 1

## 2022-07-18 SURGICAL SUPPLY — 59 items
ATTRACTOMAT 16X20 MAGNETIC DRP (DRAPES) IMPLANT
BAG COUNTER SPONGE SURGICOUNT (BAG) ×2 IMPLANT
BAG SPNG CNTER NS LX DISP (BAG)
BALLN FRONTAL NUVENT 6X17 (BALLOONS) ×2
BALLOON FRONTAL NUVENT 6X17 (BALLOONS) IMPLANT
BLADE INF TURB ROT M4 2 5PK (BLADE) ×2 IMPLANT
BLADE ROTATE RAD 40 4 M4 (BLADE) IMPLANT
BLADE ROTATE TRICUT 4X13 M4 (BLADE) ×2 IMPLANT
BLADE SHAVER TURBINATE 11X2.9 (BLADE) IMPLANT
BLADE SURG 15 STRL LF DISP TIS (BLADE) IMPLANT
BLADE SURG 15 STRL SS (BLADE) ×2
CANISTER SUCT 3000ML PPV (MISCELLANEOUS) ×4 IMPLANT
COAGULATOR SUCT 8FR VV (MISCELLANEOUS) IMPLANT
COAGULATOR SUCT SWTCH 10FR 6 (ELECTROSURGICAL) IMPLANT
DRAPE HALF SHEET 40X57 (DRAPES) IMPLANT
DRSG NASOPORE 8CM (GAUZE/BANDAGES/DRESSINGS) IMPLANT
ELECT COATED BLADE 2.86 ST (ELECTRODE) IMPLANT
ELECT REM PT RETURN 9FT ADLT (ELECTROSURGICAL) ×2
ELECTRODE REM PT RTRN 9FT ADLT (ELECTROSURGICAL) ×2 IMPLANT
FILTER ARTHROSCOPY CONVERTOR (FILTER) ×2 IMPLANT
GAUZE SPONGE 2X2 8PLY STRL LF (GAUZE/BANDAGES/DRESSINGS) ×2 IMPLANT
GAUZE SPONGE 4X4 12PLY STRL (GAUZE/BANDAGES/DRESSINGS) IMPLANT
GLOVE BIO SURGEON STRL SZ 6.5 (GLOVE) ×4 IMPLANT
GOWN STRL REUS W/ TWL LRG LVL3 (GOWN DISPOSABLE) ×2 IMPLANT
GOWN STRL REUS W/TWL LRG LVL3 (GOWN DISPOSABLE) ×2
HEMOSTAT ARISTA ABSORB 3G PWDR (HEMOSTASIS) IMPLANT
IMPL PROPEL MINI SINUS SDS (Prosthesis and Implant ENT) IMPLANT
IMPLANT PROPEL MINI SINUS SDS (Prosthesis and Implant ENT) ×4 IMPLANT
INFLATOR BALLOON W/TUBE (BALLOONS) IMPLANT
KIT BASIN OR (CUSTOM PROCEDURE TRAY) ×2 IMPLANT
KIT TURNOVER KIT B (KITS) ×2 IMPLANT
NDL HYPO 25GX1X1/2 BEV (NEEDLE) ×4 IMPLANT
NDL SPNL 25GX3.5 QUINCKE BL (NEEDLE) ×2 IMPLANT
NEEDLE HYPO 25GX1X1/2 BEV (NEEDLE) ×2 IMPLANT
NEEDLE SPNL 25GX3.5 QUINCKE BL (NEEDLE) ×2 IMPLANT
NS IRRIG 1000ML POUR BTL (IV SOLUTION) ×2 IMPLANT
PAD ARMBOARD 7.5X6 YLW CONV (MISCELLANEOUS) ×4 IMPLANT
PATTIES SURGICAL .5 X3 (DISPOSABLE) ×2 IMPLANT
PENCIL SMOKE EVACUATOR (MISCELLANEOUS) IMPLANT
SOL ANTI FOG 6CC (MISCELLANEOUS) ×2 IMPLANT
SPLINT NASAL DOYLE BI-VL (GAUZE/BANDAGES/DRESSINGS) IMPLANT
SPLINT NASAL POSISEP X .6X2 (GAUZE/BANDAGES/DRESSINGS) ×2 IMPLANT
SUT CHROMIC 4 0 P 3 18 (SUTURE) IMPLANT
SUT ETHILON 3 0 PS 1 (SUTURE) IMPLANT
SUT PLAIN 4 0 ~~LOC~~ 1 (SUTURE) IMPLANT
SUT SILK 2 0 SH (SUTURE) ×2 IMPLANT
SWAB COLLECTION DEVICE MRSA (MISCELLANEOUS) IMPLANT
SWAB CULTURE ESWAB REG 1ML (MISCELLANEOUS) IMPLANT
SYR CONTROL 10ML LL (SYRINGE) IMPLANT
SYR TB 1ML LUER SLIP (SYRINGE) ×4 IMPLANT
TOWEL GREEN STERILE FF (TOWEL DISPOSABLE) ×2 IMPLANT
TRACKER ENT INSTRUMENT (MISCELLANEOUS) ×4 IMPLANT
TRACKER ENT PATIENT (MISCELLANEOUS) ×2 IMPLANT
TRAY ENT MC OR (CUSTOM PROCEDURE TRAY) ×2 IMPLANT
TUBE CONNECTING 12X1/4 (SUCTIONS) ×2 IMPLANT
TUBE SALEM SUMP 16F (TUBING) ×2 IMPLANT
TUBING EXTENTION W/L.L. (IV SETS) IMPLANT
TUBING STRAIGHTSHOT EPS 5PK (TUBING) ×2 IMPLANT
WATER STERILE IRR 1000ML POUR (IV SOLUTION) IMPLANT

## 2022-07-18 NOTE — Transfer of Care (Signed)
Immediate Anesthesia Transfer of Care Note  Patient: Brittany Simon  Procedure(s) Performed: SINUS ENDO WITH FUSION (Bilateral: Nose) INFERIOR TURBINATE REDUCTION (Bilateral: Nose) SEPTOPLASTY (Bilateral: Nose) TOTAL ETHMOIDECTOMY, AND MAXILLARY ANTROSTOMY (Bilateral: Nose) SPHENOIDECTOMY (Bilateral: Nose) INFERIOR TURBINATE REDUCTION (Bilateral: Nose) NASOFRONTAL RECESS EXPLORATION (Bilateral: Nose) POLYPECTOMY NASAL (Bilateral)  Patient Location: PACU  Anesthesia Type:General  Level of Consciousness: drowsy  Airway & Oxygen Therapy: Patient connected to face mask  Post-op Assessment: Report given to RN and Post -op Vital signs reviewed and stable  Post vital signs: Reviewed and stable  Last Vitals:  Vitals Value Taken Time  BP 114/70 07/18/22 1235  Temp 37.2 C 07/18/22 1235  Pulse 98 07/18/22 1238  Resp 23 07/18/22 1238  SpO2 99 % 07/18/22 1238  Vitals shown include unvalidated device data.  Last Pain:  Vitals:   07/18/22 0815  TempSrc:   PainSc: 0-No pain         Complications: No notable events documented.

## 2022-07-18 NOTE — Op Note (Signed)
OPERATIVE NOTE  ZARAY ARRUE Date/Time of Admission: 07/18/2022  6:41 AM  CSN: [id number];ION:[id number] Attending Provider: Cheron Schaumann A, DO Room/Bed: MCPO/NONE DOB: October 06, 1995 Age: 27 y.o.   Pre-Op Diagnosis: Nasal polyp, unspecified Allergic rhinitis, unspecified Chronic pansinusitis  Post-Op Diagnosis: Nasal polyp, unspecifiedAllergic rhinitis, unspecifiedChronic pansinusitis  Procedure: Procedure(s): Septoplasty and submucous resection of the bilateral inferior turbinates Bilateral endoscopic sinus surgery with image guidance Sinus surgery to include:  Bilateral maxillary antrostomy with tissue removal Bilateral frontal recess exploration Bilateral total ethmoidectomy Bilateral sphenotomy with tissue removal Polypectomy  Anesthesia: General  Surgeon(s): Laren Boom, DO  Staff: Circulator: Oswaldo Conroy, RN; Osborne Oman, RN Scrub Person: Carmela Rima  Implants: Implant Name Type Inv. Item Serial No. Manufacturer Lot No. LRB No. Used Action  IMPLANT PROPEL MINI SINUS SDS - [id number] Prosthesis and Implant ENT IMPLANT PROPEL MINI SINUS SDS  [employer] NEUROMOD PAIN MGMT [id number]  1 Implanted  IMPLANT PROPEL MINI SINUS SDS - [id number] Prosthesis and Implant ENT IMPLANT PROPEL MINI SINUS SDS  [employer] NEUROMOD PAIN MGMT [id number]  1 Implanted    Specimens: ID Type Source Tests Collected by Time Destination  1 : Sinus Contents and Nasal Polyps Tissue PATH Sinus Contents/Nasal Polyps SURGICAL PATHOLOGY Narelle Schoening A, DO 07/18/2022 1217     Complications: None  EBL: 50 ML  Condition: stable  Operative Findings:  Extensive polyps and thick, allergic mucin filling all sinuses and extending into nasal cavity bilaterally. Right septal deviation.  Description of Operation: Once operative consent was obtained and the site and surgery were confirmed with the patient and the operating room team, the patient was brought back to the operating  room and general endotracheal anesthesia was obtained. The image-guided system was attached and noted to be in good calibration. Lidocaine 1% with 1:100,000 epinephrine was injected into the nasal septum bilaterally, inferior turbinates bilaterally, the middle turbinates bilaterally, and the axilla between the medial turbinate and the lateral nasal wall. Afrin-soaked pledgets were placed into the nasal cavity, and the patient was prepped and draped in sterile fashion. Attention was first turned to the left nasal vestibule. A left sided hemi-transfixion incision was made a submucoperichondrial flap was elevated on the left.  A submucous resection of  nasal septal cartilage was performed with care taken to leave a 1 cm caudal and dorsal strut. In doing this, a right-sided submucoperichondrial flap was elevated.  The bony nasal septum was addressed by lifting up the soft tissues, separating the superior septum with a double action scissor and then removing the inferior deflected portion. The nasal spine was removed using the Risk analyst and Lenoria Chime. With this completed, the nasal septum was midline. The submucoperichondrial flaps were returned to their anatomic position and hemi-transfixion incision was closed with interrupted 4-0 chromic gut. A 4-0 plain gut suture was used to perform a mattress style stitch in a circular direction from anterior to posterior septum to re-approximate the right and left sided flaps.  Attention then turned to the right-sided sinonasal cavity. A large polyp was noted to be obstructing the middle meatus and was removed in order to identify normal anatomy. The middle turbinate was medialized and a ball-tipped seeker was used to slightly anterior fracture the uncinate process.  The frontal recess was dilated to 6 mm in two locations with a navigatable frontal balloon. The inferior frontal recess was then explored and widened. Attention was then turned to the uncinate process, which was  completely fractured anteriorly and then removed with a  combination of backbiter and a microdebrider. A ball-tipped seeker was used to identify the natural os of the maxillary sinus, and it was widened with combination of the backbiter, the microdebrider, the olive tip suction, and the straight TruCut until it was widely patent. Polyps and copious allergic mucin was removed. The frontal and maxillary sinus cavities were irrigated. Utilizing image-guided suction, the medial inferior quadrant of the ethmoid bulla was entered bluntly, the walls were fractured and the bulla was removed utilizing a microdebrider. Anterior and posterior ethmoidectomy were completed in a standard fashion by first locating the anterior face of the sphenoid sinus and then following the skull base and the lamina papyracea to fully take down all ethmoid cells. This was done utilizing a combination of the straight suction, the J curette, the ball-tipped seeker, and the microdebrider. The os of the sphenoid sinus was identified medially and inferiorly and entered with image guided suction. It was then widened with a micro debrider.  Polyps and copious thick allergic mucin was removed from the sinus and os. Attention was then turned to the inferior turbinate. It was outfractured and then submucous resection was performed by making an incision in the leading edge with a 15 blade, separating the mucosa from bone with a Cottle elevator and then using the micro debrider via a turbinate blade to remove bone.  After the sinus was copiously irrigated, Arista was placed in the nasal cavity, followed by a mini propel stent, and Posisep nasal packing was placed lateral to the middle turbinate in the axilla between it and the lateral nasal wall. Attention was then turned to the left side. This exact same procedure was performed on the left side of the sinonasal cavity. Attention was then turned to the inferior turbinates.They were outfractured and then  submucous resection was performed by making an incision in the leading edge with a 15 blade, separating the mucosa from bone with a Cottle elevator and then using the micro debrider with a turbinate blade to remove bone.  Doyle nasal splints were placed in the bilateral nasal cavities and sutured to the columella with a 2-0 silk suture and a nasal drip pad was applied. An orogastric tube was placed and the stomach cavity was suctioned to reduce postoperative nausea. The patient was turned over to anesthesia service and was extubated in the operating room and transferred to the PACU in stable condition. The patient will be discharged today and followed up in the ENT clinic in 1 week for postoperative check.   Laren Boom, DO [facility] ENT  07/18/2022

## 2022-07-18 NOTE — H&P (Signed)
Brittany Simon is an 27 y.o. female.    Chief Complaint:  Chronic pansinusitis   HPI: Patient presents today for planned elective procedure.  She denies any interval change in history since office visit on 04/04/2022:  Brittany Simon is a 27 y.o. female who presents as a return consult, referred by Panosh, Simmie Davies, MD, for follow up of chronic pansinusitis with nasal polyposis. She presents today for review of CT sinus and discussion of possible surgical intervention. She continues to have no sense of smell, limited sense of taste and significant nasal obstructive symptoms.  Last visit 03/03/2022: Brittany Simon is a 27 y.o. female who presents as a return patient, referred by Panosh, Simmie Davies, MD, for evaluation and treatment of nasal polyps. Patient was last seen in our office on 08/22/2021 for follow up, and at that time had been feeling fairly stable after starting Xhance. She continues to use the medication daily, but finds that she is getting limited benefit at this time. She is also performing nasal saline rinses routinely. Which she finds more helpful. She has not been treated with any additional courses of antibiotics since her last visit. She continues to see her allergist routinely, and continues on antihistamine and Singulair. She states she feels very obstructed and endorses continued hyposmia and hypogeusia.   Past Medical History:  Diagnosis Date   Acute respiratory failure with hypoxia (HCC) 01/10/2021   Allergy    Anemia 02/2018   Asthma    Infectious mononucleosis 09/26/2013   presenting with has and periorbital swelling off and on no other alarm features    Prematurity, fetus 35-36 completed weeks of gestation     4 40z 36 weeks    Past Surgical History:  Procedure Laterality Date   NO PAST SURGERIES      Family History  Problem Relation Age of Onset   Colon polyps Mother    Asthma Father    Colon cancer Neg Hx    Esophageal cancer Neg Hx    Rectal cancer Neg Hx     Stomach cancer Neg Hx     Social History:  reports that she has never smoked. She has never used smokeless tobacco. She reports current alcohol use. She reports that she does not use drugs.  Allergies:  Allergies  Allergen Reactions   Dupilumab Other (See Comments)    Pink eye    Medications Prior to Admission  Medication Sig Dispense Refill   albuterol (PROVENTIL) (2.5 MG/3ML) 0.083% nebulizer solution Take 3 mLs (2.5 mg total) by nebulization every 4 (four) hours as needed for wheezing or shortness of breath. 75 mL 2   albuterol (VENTOLIN HFA) 108 (90 Base) MCG/ACT inhaler Inhale 2 puffs every 6 hours as needed for cough, wheeze, tightness in chest, or shortness of breath 18 g 1   Budeson-Glycopyrrol-Formoterol (BREZTRI AEROSPHERE) 160-9-4.8 MCG/ACT AERO Inhale 2 puffs twice a day with spacer to help prevent cough and wheeze 10.7 g 5   cetirizine (ZYRTEC) 10 MG tablet Take 1 tablet once a day as needed for runny nose 30 tablet 5   EPINEPHrine 0.3 mg/0.3 mL IJ SOAJ injection Inject 0.3 mg into the muscle as directed. 2 each 1   escitalopram (LEXAPRO) 10 MG tablet TAKE 1 TABLET (10 MG TOTAL) BY MOUTH DAILY. DOSAGE ADJUSTMENT 90 tablet 1   montelukast (SINGULAIR) 10 MG tablet Take 1 tablet (10 mg total) by mouth at bedtime. (Patient taking differently: Take 10 mg by mouth at bedtime as needed (allergies/asthma).)  30 tablet 5   XHANCE 93 MCG/ACT EXHU Place 2 sprays into both nostrils daily as needed (rhinitis).     betamethasone dipropionate 0.05 % lotion Apply topically daily. SMARTSIG:Sparingly Topical Strength: 0.05 % (Patient not taking: Reported on 07/08/2022) 60 mL 0    No results found for this or any previous visit (from the past 48 hour(s)). No results found.  ROS: ROS  Blood pressure 124/86, pulse 89, temperature 98.6 F (37 C), temperature source Oral, resp. rate 18, height 5\' 2"  (1.575 m), weight 79.4 kg, last menstrual period 06/20/2022, SpO2 95 %.  PHYSICAL  EXAM: Physical Exam Constitutional:      Appearance: Normal appearance.  HENT:     Right Ear: External ear normal.     Left Ear: External ear normal.  Pulmonary:     Effort: Pulmonary effort is normal.  Neurological:     General: No focal deficit present.     Mental Status: She is alert.  Psychiatric:        Mood and Affect: Mood normal.        Behavior: Behavior normal.     Studies Reviewed: CT Sinus reviewed   Assessment/Plan Brittany Simon is a 27 y.o. female with history of asthma, allergic rhinitis and chronic sinusitis with nasal polyposis presenting for follow up.  Imaging performed in June of 2023 demonstrates findings consistent with acute on chronic sinusitis with nasal polyposis.  Patient was treated with a course of Levaquin, and prescribed Xhance nasal spray, as she was previously started on Dupixent and unable to tolerate secondary to conjunctivitis.  -To OR today for functional endoscopic sinus surgery with bilateral maxillary antrostomy, total ethmoidectomy, nasofrontal recess exploration, sphenoidotomy with possible septoplasty and placement of propel stents. Risks, post-operative course and recovery were reviewed. All questions answered.     Brittany Simon A Isabella Ida 07/18/2022, 7:38 AM

## 2022-07-18 NOTE — Anesthesia Procedure Notes (Signed)
Procedure Name: Intubation Date/Time: 07/18/2022 9:21 AM  Performed by: Evlyn Courier, CRNAPre-anesthesia Checklist: Patient identified, Emergency Drugs available, Suction available and Patient being monitored Patient Re-evaluated:Patient Re-evaluated prior to induction Oxygen Delivery Method: Circle System Utilized Preoxygenation: Pre-oxygenation with 100% oxygen Induction Type: IV induction Ventilation: Mask ventilation without difficulty Grade View: Grade I Tube type: Oral Tube size: 7.0 mm Number of attempts: 1 Airway Equipment and Method: Stylet and Oral airway Placement Confirmation: ETT inserted through vocal cords under direct vision, positive ETCO2 and breath sounds checked- equal and bilateral Tube secured with: Tape Dental Injury: Teeth and Oropharynx as per pre-operative assessment

## 2022-07-18 NOTE — Discharge Instructions (Signed)
[facility] Post Operative Instructions  Office: [phone]  The Surgery Itself Endoscopic sinus surgery (with or without septoplasty and turbinate reduction) involves general anesthesia, typically for one to two hours. Patients may be sedated for several hours after surgery and may remain sleepy for the better part of the day. Nausea and vomiting are occasionally seen, and usually resolve by the evening of surgery - even without additional medications. Almost all patients can go home the day of surgery.  After Surgery  Facial pressure and fullness similar to a sinus infection/headache is normal after surgery. Breathing through your nose is also difficult due to swelling. A humidifier or vaporizer can be used in the bedroom to prevent throat pain with mouth breathing.   Bloody nasal drainage is normal after this surgery for 5-7 days, usually decreasing in volume with each day that passes. Drainage will flow from the front of the nose and down the back of the throat. Make sure you spit out blood drainage that drips down the back of your throat to prevent nausea/vomiting. You will have a nasal drip pad/sling with gauze to catch drainage from the front of your nose. The dressing may need to be changed frequently during the first 24 hours following surgery. In case of profuse nasal bleeding, you may apply ice to the bridge of the nose and pinch the nose just above the tip and hold for 10 minutes; if bleeding continues, contact the doctors office.   Frequent hot showers or saline nasal rinses (NeilMed) will help break up congestion and clear any clot or mucus that builds up within the nose after surgery. This can be started the day after surgery.   It is more comfortable to sleep with extra pillows or in a recliner for the first few days after surgery until the drainage begins to resolve.    Do not blow your nose for 2 weeks after surgery.   Avoid lifting > 10 lbs. and no  vigorous exercise for 2 weeks after surgery.   Avoid airplane travel for 2 weeks following sinus surgery; the cabin pressure changes can cause pain and swelling within the nose/sinuses.   Sense of smell and taste are often diminished for several weeks after surgery. There may be some tenderness or numbness in your upper front teeth, which is normal after surgery. You may express old clot, discolored mucus or very large nasal crusts from your nose for up to 3-4 weeks after surgery; depending on how frequently and how effectively you irrigate your nose with the saltwater spray.   You may have absorbable sutures inside of your nose after surgery that will slowly dissolve in 2-3 weeks. Be careful when clearing crusts from the nose since they may be attached to these sutures.  Medications  Pain medication can be used for pain as prescribed. Pain and pressure in the nose is expected after surgery. As the surgical site heals, pain will resolve over the course of a week. Pain medications can cause nausea, which can be prevented if you take them with food or milk.   You may be given an antibiotic for one week after surgery to prevent infection. Take this medication with food to prevent nausea or vomiting.   You can use 2 nasal sprays after surgery: Afrin can be used up to 2 times a day for up to 5 days after surgery (best before bed) to reduce bloody drainage from the nose for the first few days after surgery. Saline/salt   water spray should be used at least 4-6 times per day, starting the day after surgery to prevent crusting inside of the nose.   Take all of your routine medications as prescribed, unless told otherwise by your surgeon. Any medications that thin the blood should be avoided. This includes aspirin. Avoid aspirin-like products for the first 72 hours after surgery (Advil, Motrin, Excedrin, Alieve, Celebrex, Naprosyn), but you may use them as needed for pain after 72 hours.   

## 2022-07-18 NOTE — Anesthesia Postprocedure Evaluation (Signed)
Anesthesia Post Note  Patient: GIANELLY SIME  Procedure(s) Performed: SINUS ENDO WITH FUSION (Bilateral: Nose) INFERIOR TURBINATE REDUCTION (Bilateral: Nose) SEPTOPLASTY (Bilateral: Nose) TOTAL ETHMOIDECTOMY, AND MAXILLARY ANTROSTOMY (Bilateral: Nose) SPHENOIDECTOMY (Bilateral: Nose) INFERIOR TURBINATE REDUCTION (Bilateral: Nose) NASOFRONTAL RECESS EXPLORATION (Bilateral: Nose) POLYPECTOMY NASAL (Bilateral)     Patient location during evaluation: PACU Anesthesia Type: General Level of consciousness: awake and alert Pain management: pain level controlled Vital Signs Assessment: post-procedure vital signs reviewed and stable Respiratory status: spontaneous breathing, nonlabored ventilation and respiratory function stable Cardiovascular status: blood pressure returned to baseline and stable Postop Assessment: no apparent nausea or vomiting Anesthetic complications: no  No notable events documented.  Last Vitals:  Vitals:   07/18/22 1345 07/18/22 1400  BP: 126/82 116/79  Pulse: 96 90  Resp: 15 14  Temp:    SpO2: 95% 92%    Last Pain:  Vitals:   07/18/22 1330  TempSrc:   PainSc: 5                  Ellice Boultinghouse,W. EDMOND

## 2022-07-19 ENCOUNTER — Encounter (HOSPITAL_COMMUNITY): Payer: Self-pay | Admitting: Otolaryngology

## 2022-07-21 LAB — SURGICAL PATHOLOGY

## 2022-08-12 ENCOUNTER — Encounter: Payer: Self-pay | Admitting: Allergy

## 2022-10-07 ENCOUNTER — Encounter: Payer: Self-pay | Admitting: Internal Medicine

## 2022-10-08 MED ORDER — ALBUTEROL SULFATE (2.5 MG/3ML) 0.083% IN NEBU: 2.5000 mg | INHALATION_SOLUTION | RESPIRATORY_TRACT | 0 refills | Status: DC | PRN

## 2022-10-08 NOTE — Telephone Encounter (Signed)
I am forwarding this request to Dr Delorse Lek team since they are  now managing your asthma and allergy .  I would like them to take over this rx for now  for best care .

## 2022-10-09 NOTE — Telephone Encounter (Signed)
Spoke to Brittany Simon and inform her that Dr. Fabian Sharp has forwarded the refill to her allergist/asthma team to do the refill. Also that they are working on it. Brittany Simon verbalized understanding.   No further action is needed.

## 2022-10-30 ENCOUNTER — Other Ambulatory Visit: Payer: Self-pay | Admitting: Family

## 2022-11-26 NOTE — Progress Notes (Unsigned)
No chief complaint on file.   HPI: Patient  Brittany Simon  27 y.o. comes in today for Preventive Health Care visit   Asthma  lexapro  Health Maintenance  Topic Date Due   Hepatitis C Screening  Never done   Cervical Cancer Screening (Pap smear)  Never done   INFLUENZA VACCINE  09/04/2022   COVID-19 Vaccine (4 - 2023-24 season) 10/05/2022   DTaP/Tdap/Td (8 - Tdap) 01/09/2027   HPV VACCINES  Completed   HIV Screening  Completed   Health Maintenance Review LIFESTYLE:  Exercise:   Tobacco/ETS: Alcohol:  Sugar beverages: Sleep: Drug use: no HH of  Work:    ROS:  GEN/ HEENT: No fever, significant weight changes sweats headaches vision problems hearing changes, CV/ PULM; No chest pain shortness of breath cough, syncope,edema  change in exercise tolerance. GI /GU: No adominal pain, vomiting, change in bowel habits. No blood in the stool. No significant GU symptoms. SKIN/HEME: ,no acute skin rashes suspicious lesions or bleeding. No lymphadenopathy, nodules, masses.  NEURO/ PSYCH:  No neurologic signs such as weakness numbness. No depression anxiety. IMM/ Allergy: No unusual infections.  Allergy .   REST of 12 system review negative except as per HPI   Past Medical History:  Diagnosis Date   Acute respiratory failure with hypoxia (HCC) 01/10/2021   Allergy    Anemia 02/2018   Asthma    Infectious mononucleosis 09/26/2013   presenting with has and periorbital swelling off and on no other alarm features    Prematurity, fetus 35-36 completed weeks of gestation     4 40z 36 weeks    Past Surgical History:  Procedure Laterality Date   GASTROC RECESSION EXTREMITY Bilateral 07/18/2022   Procedure: NASOFRONTAL RECESS EXPLORATION;  Surgeon: Laren Boom, DO;  Location: [facility] OR;  Service: ENT;  Laterality: Bilateral;   NASAL TURBINATE REDUCTION Bilateral 07/18/2022   Procedure: INFERIOR TURBINATE REDUCTION;  Surgeon: Laren Boom, DO;  Location: [facility] OR;  Service:  ENT;  Laterality: Bilateral;   NO PAST SURGERIES     POLYPECTOMY Bilateral 07/18/2022   Procedure: POLYPECTOMY NASAL;  Surgeon: Laren Boom, DO;  Location: [facility] OR;  Service: ENT;  Laterality: Bilateral;   SEPTOPLASTY Bilateral 07/18/2022   Procedure: SEPTOPLASTY;  Surgeon: Laren Boom, DO;  Location: [facility] OR;  Service: ENT;  Laterality: Bilateral;   SEPTOPLASTY WITH ETHMOIDECTOMY, AND MAXILLARY ANTROSTOMY Bilateral 07/18/2022   Procedure: TOTAL ETHMOIDECTOMY, AND MAXILLARY ANTROSTOMY;  Surgeon: Laren Boom, DO;  Location: [facility] OR;  Service: ENT;  Laterality: Bilateral;   SINUS ENDO WITH FUSION Bilateral 07/18/2022   Procedure: SINUS ENDO WITH FUSION;  Surgeon: Laren Boom, DO;  Location: [facility] OR;  Service: ENT;  Laterality: Bilateral;   SPHENOIDECTOMY Bilateral 07/18/2022   Procedure: Selina Cooley;  Surgeon: Laren Boom, DO;  Location: [facility];  Service: ENT;  Laterality: Bilateral;   TURBINATE REDUCTION Bilateral 07/18/2022   Procedure: INFERIOR TURBINATE REDUCTION;  Surgeon: Laren Boom, DO;  Location: [facility] OR;  Service: ENT;  Laterality: Bilateral;    Family History  Problem Relation Age of Onset   Colon polyps Mother    Asthma Father    Colon cancer Neg Hx    Esophageal cancer Neg Hx    Rectal cancer Neg Hx    Stomach cancer Neg Hx     Social History   Socioeconomic History   Marital status: Single    Spouse name: Not on file   Number of children:  0   Years of education: Not on file   Highest education level: Not on file  Occupational History   Occupation: teacher  Tobacco Use   Smoking status: Never   Smokeless tobacco: Never  Vaping Use   Vaping status: Never Used  Substance and Sexual Activity   Alcohol use: Yes    Comment: occassionally    Drug use: No   Sexual activity: Not Currently  Other Topics Concern   Not on file  Social History Narrative   Single   Orig from [facility] of [facility]  One dog      IB program.    [place].    [employer] fall 15   [employer]    Education major, spanish minor    [employer]  4th grade    Social Determinants of Health   Financial Resource Strain: Not on file  Food Insecurity: Low Risk  (11/10/2022)   Received from [facility]   Hunger Vital Sign    Worried About Running Out of Food in the Last Year: Never true    Ran Out of Food in the Last Year: Never true  Transportation Needs: No Transportation Needs (11/10/2022)   Received from [facility]    In the past 12 months, has lack of reliable transportation kept you from medical appointments, meetings, work or from getting things needed for daily living? : No  Physical Activity: Not on file  Stress: Not on file  Social Connections: Unknown (06/18/2021)   [facility]   Social Network    Social Network: Not on file    Outpatient Medications Prior to Visit  Medication Sig Dispense Refill   albuterol (PROVENTIL) (2.5 MG/3ML) 0.083% nebulizer solution Take 3 mLs (2.5 mg total) by nebulization every 4 (four) hours as needed for wheezing or shortness of breath. 150 mL 0   albuterol (VENTOLIN HFA) 108 (90 Base) MCG/ACT inhaler INHALE 2 PUFFS EVERY 6 HOURS AS NEEDED FOR COUGH, WHEEZE, TIGHTNESS IN CHEST, OR SHORTNESS OF BREATH 18 each 1   betamethasone dipropionate 0.05 % lotion Apply topically daily. SMARTSIG:Sparingly Topical Strength: 0.05 % (Patient not taking: Reported on 07/08/2022) 60 mL 0   Budeson-Glycopyrrol-Formoterol (BREZTRI AEROSPHERE) 160-9-4.8 MCG/ACT AERO Inhale 2 puffs twice a day with spacer to help prevent cough and wheeze 10.7 g 5   cetirizine (ZYRTEC) 10 MG tablet Take 1 tablet once a day as needed for runny nose 30 tablet 5   EPINEPHrine 0.3 mg/0.3 mL IJ SOAJ injection Inject 0.3 mg into the muscle as directed. 2 each 1   escitalopram (LEXAPRO) 10 MG tablet TAKE 1 TABLET (10 MG TOTAL) BY MOUTH DAILY. DOSAGE ADJUSTMENT 90 tablet 1   montelukast (SINGULAIR) 10 MG  tablet Take 1 tablet (10 mg total) by mouth at bedtime. (Patient taking differently: Take 10 mg by mouth at bedtime as needed (allergies/asthma).) 30 tablet 5   XHANCE 93 MCG/ACT EXHU Place 2 sprays into both nostrils daily as needed (rhinitis).     No facility-administered medications prior to visit.     EXAM:  There were no vitals taken for this visit.  There is no height or weight on file to calculate BMI. Wt Readings from Last 3 Encounters:  07/18/22 175 lb (79.4 kg)  07/11/22 175 lb 14.4 oz (79.8 kg)  02/24/22 179 lb 3.2 oz (81.3 kg)    Physical Exam: Vital signs reviewed ONG:EXBM is a well-developed well-nourished alert cooperative  who appearsr stated age in no acute distress.  HEENT: normocephalic atraumatic , Eyes: PERRL EOM's full, conjunctiva clear, Nares: paten,t no deformity discharge or tenderness., Ears: no deformity EAC's clear TMs with normal landmarks. Mouth: clear OP, no lesions, edema.  Moist mucous membranes. Dentition in adequate repair. NECK: supple without masses, thyromegaly or bruits. CHEST/PULM:  Clear to auscultation and percussion breath sounds equal no wheeze , rales or rhonchi. No chest wall deformities or tenderness. Breast: normal by inspection . No dimpling, discharge, masses, tenderness or discharge . CV: PMI is nondisplaced, S1 S2 no gallops, murmurs, rubs. Peripheral pulses are full without delay.No JVD .  ABDOMEN: Bowel sounds normal nontender  No guard or rebound, no hepato splenomegal no CVA tenderness.  No hernia. Extremtities:  No clubbing cyanosis or edema, no acute joint swelling or redness no focal atrophy NEURO:  Oriented x3, cranial nerves 3-12 appear to be intact, no obvious focal weakness,gait within normal limits no abnormal reflexes or asymmetrical SKIN: No acute rashes normal turgor, color, no bruising or petechiae. PSYCH: Oriented, good eye contact, no obvious depression anxiety, cognition and judgment appear normal. LN: no  cervical axillary adenopathy  Lab Results  Component Value Date   WBC 13.7 (H) 07/11/2022   HGB 9.8 (L) 07/11/2022   HCT 31.9 (L) 07/11/2022   PLT 374 07/11/2022   GLUCOSE 87 11/18/2021   CHOL 172 11/18/2021   TRIG 118.0 11/18/2021   HDL 41.20 11/18/2021   LDLCALC 107 (H) 11/18/2021   ALT 8 11/18/2021   AST 13 11/18/2021   NA 138 11/18/2021   K 3.5 11/18/2021   CL 103 11/18/2021   CREATININE 0.48 11/18/2021   BUN 10 11/18/2021   CO2 26 11/18/2021   TSH 1.69 11/18/2021   HGBA1C 5.9 02/25/2021    BP Readings from Last 3 Encounters:  07/18/22 116/79  07/11/22 114/79  05/30/22 112/72    Lab plan  reviewed with patient   ASSESSMENT AND PLAN:  Discussed the following assessment and plan:    ICD-10-CM   1. Visit for preventive health examination  Z00.00     2. Medication management  Z79.899     3. Anemia, unspecified type  D64.9      No follow-ups on file.  Patient Care Team: Madelin Headings, MD as PCP - General There are no Patient Instructions on file for this visit.  Neta Mends. Javae Braaten M.D.

## 2022-11-27 ENCOUNTER — Encounter: Payer: Self-pay | Admitting: Internal Medicine

## 2022-11-27 ENCOUNTER — Ambulatory Visit: Payer: BC Managed Care – PPO | Admitting: Internal Medicine

## 2022-11-27 ENCOUNTER — Ambulatory Visit (INDEPENDENT_AMBULATORY_CARE_PROVIDER_SITE_OTHER): Payer: BC Managed Care – PPO | Admitting: Allergy

## 2022-11-27 ENCOUNTER — Other Ambulatory Visit: Payer: Self-pay

## 2022-11-27 ENCOUNTER — Encounter: Payer: Self-pay | Admitting: Allergy

## 2022-11-27 VITALS — BP 104/70 | HR 88 | Temp 98.8°F | Ht 62.0 in | Wt 173.4 lb

## 2022-11-27 VITALS — BP 112/80 | Resp 18 | Ht 62.99 in | Wt 173.7 lb

## 2022-11-27 DIAGNOSIS — D649 Anemia, unspecified: Secondary | ICD-10-CM | POA: Diagnosis not present

## 2022-11-27 DIAGNOSIS — J339 Nasal polyp, unspecified: Secondary | ICD-10-CM

## 2022-11-27 DIAGNOSIS — Z0000 Encounter for general adult medical examination without abnormal findings: Secondary | ICD-10-CM | POA: Diagnosis not present

## 2022-11-27 DIAGNOSIS — J3089 Other allergic rhinitis: Secondary | ICD-10-CM

## 2022-11-27 DIAGNOSIS — R739 Hyperglycemia, unspecified: Secondary | ICD-10-CM

## 2022-11-27 DIAGNOSIS — J4550 Severe persistent asthma, uncomplicated: Secondary | ICD-10-CM

## 2022-11-27 DIAGNOSIS — T7800XA Anaphylactic reaction due to unspecified food, initial encounter: Secondary | ICD-10-CM

## 2022-11-27 DIAGNOSIS — T7800XD Anaphylactic reaction due to unspecified food, subsequent encounter: Secondary | ICD-10-CM

## 2022-11-27 DIAGNOSIS — E049 Nontoxic goiter, unspecified: Secondary | ICD-10-CM | POA: Diagnosis not present

## 2022-11-27 DIAGNOSIS — Z79899 Other long term (current) drug therapy: Secondary | ICD-10-CM

## 2022-11-27 DIAGNOSIS — D509 Iron deficiency anemia, unspecified: Secondary | ICD-10-CM

## 2022-11-27 LAB — LIPID PANEL
Cholesterol: 190 mg/dL (ref 0–200)
HDL: 48.9 mg/dL (ref >39.00)
LDL Cholesterol: 118 mg/dL — ABNORMAL HIGH (ref 0–99)
NonHDL: 141.39
Total CHOL/HDL Ratio: 4
Triglycerides: 119 mg/dL (ref 0.0–149.0)
VLDL: 23.8 mg/dL (ref 0.0–40.0)

## 2022-11-27 LAB — IBC + FERRITIN
Ferritin: 5.4 ng/mL — ABNORMAL LOW (ref 10.0–291.0)
Iron: 35 ug/dL — ABNORMAL LOW (ref 42–145)
Saturation Ratios: 8.8 % — ABNORMAL LOW (ref 20.0–50.0)
TIBC: 399 ug/dL (ref 250.0–450.0)
Transferrin: 285 mg/dL (ref 212.0–360.0)

## 2022-11-27 LAB — CBC WITH DIFFERENTIAL/PLATELET
Basophils Absolute: 0.1 10*3/uL (ref 0.0–0.1)
Basophils Relative: 0.5 % (ref 0.0–3.0)
Eosinophils Absolute: 0.4 10*3/uL (ref 0.0–0.7)
Eosinophils Relative: 2.8 % (ref 0.0–5.0)
HCT: 34.7 % — ABNORMAL LOW (ref 36.0–46.0)
Hemoglobin: 10.4 g/dL — ABNORMAL LOW (ref 12.0–15.0)
Lymphocytes Relative: 22.9 % (ref 12.0–46.0)
Lymphs Abs: 3.4 10*3/uL (ref 0.7–4.0)
MCHC: 30 g/dL (ref 30.0–36.0)
MCV: 69.1 fL — ABNORMAL LOW (ref 78.0–100.0)
Monocytes Absolute: 1 10*3/uL (ref 0.1–1.0)
Monocytes Relative: 6.8 % (ref 3.0–12.0)
Neutro Abs: 9.8 10*3/uL — ABNORMAL HIGH (ref 1.4–7.7)
Neutrophils Relative %: 67 % (ref 43.0–77.0)
Platelets: 399 10*3/uL (ref 150.0–400.0)
RBC: 5.02 Mil/uL (ref 3.87–5.11)
RDW: 17.9 % — ABNORMAL HIGH (ref 11.5–15.5)
WBC: 14.7 10*3/uL — ABNORMAL HIGH (ref 4.0–10.5)

## 2022-11-27 LAB — COMPREHENSIVE METABOLIC PANEL
ALT: 10 U/L (ref 0–35)
AST: 13 U/L (ref 0–37)
Albumin: 4.1 g/dL (ref 3.5–5.2)
Alkaline Phosphatase: 57 U/L (ref 39–117)
BUN: 10 mg/dL (ref 6–23)
CO2: 27 meq/L (ref 19–32)
Calcium: 9.4 mg/dL (ref 8.4–10.5)
Chloride: 104 meq/L (ref 96–112)
Creatinine, Ser: 0.55 mg/dL (ref 0.40–1.20)
GFR: 125.77 mL/min (ref >60.00)
Glucose, Bld: 82 mg/dL (ref 70–99)
Potassium: 3.7 meq/L (ref 3.5–5.1)
Sodium: 138 meq/L (ref 135–145)
Total Bilirubin: 0.4 mg/dL (ref 0.2–1.2)
Total Protein: 7.3 g/dL (ref 6.0–8.3)

## 2022-11-27 LAB — HEMOGLOBIN A1C: Hgb A1c MFr Bld: 6.5 % (ref 4.6–6.5)

## 2022-11-27 LAB — TSH: TSH: 1.38 u[IU]/mL (ref 0.35–5.50)

## 2022-11-27 LAB — T4, FREE: Free T4: 0.95 ng/dL (ref 0.60–1.60)

## 2022-11-27 MED ORDER — ALBUTEROL SULFATE HFA 108 (90 BASE) MCG/ACT IN AERS: INHALATION_SPRAY | RESPIRATORY_TRACT | 1 refills | Status: DC

## 2022-11-27 MED ORDER — BREZTRI AEROSPHERE 160-9-4.8 MCG/ACT IN AERO: INHALATION_SPRAY | RESPIRATORY_TRACT | 5 refills | Status: DC

## 2022-11-27 NOTE — Patient Instructions (Signed)
Good to see you today . Lab today . Continue lifestyle intervention healthy eating and exercise .   Stay on lexapro.   Avoid excess sugars  to prevent diabetes.

## 2022-11-27 NOTE — Progress Notes (Signed)
Follow-up Note  RE: Brittany Simon MRN: [id number] DOB: 06-Jun-1995 Date of Office Visit: 11/27/2022   History of present illness: Brittany Simon is a 27 y.o. female presenting today for follow-up of asthma, nasal polyposis, allergic rhinitis and food allergy.  She was last seen in the office on 05/30/2022 by myself.  Discussed the use of AI scribe software for clinical note transcription with the patient, who gave verbal consent to proceed.  The patient, with a history of nasal polyps, underwent a polypectomy in June. The procedure was successful, with the patient reporting significant improvement in breathing and overall health in the months following the surgery. However, upon returning to work in September, the patient began experiencing congestion and loss of smell. A subsequent consultation with her ENT specialist revealed a sinus infection and the recurrence of small polyps.  This was managed with an antibiotic, Augmentin, course which she is set to finish tomorrow as well as prednisone course.  She does continue to perform budesonide nasal saline rinses twice a day.  The patient also reports a brief episode of respiratory distress about a month ago, which she attributes to seasonal changes. This was managed with a rescue inhaler and a nebulizer treatment, and the symptoms resolved after a couple of days. The patient continues to use Breztri once daily for asthma management, with occasional use of an albuterol inhaler.  The patient has not needed steroids for any other reason besides polyps as above in the past six months and has not had to use her EpiPen. She previously took montelukast but discontinued it due to a lapse in refilling the prescription.  She is not noted any increase in breathing or allergy symptoms ingenol since she has discontinued montelukast.  The patient reports that her allergies have been well-controlled without it.  She does continue to take Zyrtec daily.       Review of systems: 10pt ROS negative unless noted above in HPI  All other systems negative unless noted above in HPI  Past medical/social/surgical/family history have been reviewed and are unchanged unless specifically indicated below.  No changes  Medication List: Current Outpatient Medications  Medication Sig Dispense Refill   albuterol (PROVENTIL) (2.5 MG/3ML) 0.083% nebulizer solution Take 3 mLs (2.5 mg total) by nebulization every 4 (four) hours as needed for wheezing or shortness of breath. 150 mL 0   amoxicillin-clavulanate (AUGMENTIN) 875-125 MG tablet Take 1 tablet by mouth 2 (two) times daily.     budesonide (PULMICORT) 0.5 MG/2ML nebulizer solution Take 0.5 mg by nebulization daily.     cetirizine (ZYRTEC) 10 MG tablet Take 1 tablet once a day as needed for runny nose 30 tablet 5   EPINEPHrine 0.3 mg/0.3 mL IJ SOAJ injection Inject 0.3 mg into the muscle as directed. 2 each 1   escitalopram (LEXAPRO) 10 MG tablet TAKE 1 TABLET (10 MG TOTAL) BY MOUTH DAILY. DOSAGE ADJUSTMENT 90 tablet 1   albuterol (VENTOLIN HFA) 108 (90 Base) MCG/ACT inhaler Inhale 2 puffs every 6 hours as needed for cough, wheeze, tightness in chest, or shortness of breath 18 each 1   Budeson-Glycopyrrol-Formoterol (BREZTRI AEROSPHERE) 160-9-4.8 MCG/ACT AERO Inhale 2 puffs twice a day with spacer to help prevent cough and wheeze 10.7 g 5   No current facility-administered medications for this visit.     Known medication allergies: Allergies  Allergen Reactions   Dupilumab Other (See Comments)    Pink eye     Physical examination: Blood pressure 112/80, resp.  rate 18, height 5' 2.99" (1.6 m), weight 173 lb 11.2 oz (78.8 kg).  General: Alert, interactive, in no acute distress. HEENT: PERRLA, TMs pearly gray, turbinates non-edematous without discharge, post-pharynx non erythematous. Neck: Supple without lymphadenopathy. Lungs: Clear to auscultation without wheezing, rhonchi or rales. {no increased  work of breathing. CV: Normal S1, S2 without murmurs. Abdomen: Nondistended, nontender. Skin: Warm and dry, without lesions or rashes. Extremities:  No clubbing, cyanosis or edema. Neuro:   Grossly intact.  Diagnositics/Labs: Spirometry: FEV1: 1.93 L 74%, FVC: 2.74 L 90%, ratio consistent with nonobstructive pattern  Assessment and plan: Severe persistent asthma-under good control at this time Nasal polyposis-status post polypectomy in June.  Doing well after surgery but has since note lack of smell increased and then noted on exam small polyp Allergic rhinitis-currently controlled with Zyrtec and budesonide rinse Food allergy-no accidental ingestions with access to epinephrine device for as needed use   1.  Allergen avoidance measures - dust mite, cat, dog, pollen, alternaria mold, tree nut  2.  Treat and prevent inflammation:  A.  Breztri -2 puffs 1 times a day with spacer B.  Budesonide nasal rinses twice a day as directed by ENT.  C.  Zyrtec 10mg  daily  3.  If needed:  A. Albuterol HFA - 2 inhalations every 4-6 hours  C. Epi-Pen, benadryl, MD/ER evaluation for allergic reaction  4.  Discussed Nucala and Tezspire options to help with nasal polyp control.  Both help to target eosinophil production which is a component of polyp and polyp growth.  Let us know when ready to proceed with a biologic option  5.  Consider allergen immunotherapy for your environmental allergens if allergy medication is not effective enough  6. Return to clinic in 6 months or sooner needed   I appreciate the opportunity to take part in Sulay's care. Please do not hesitate to contact me with questions.  Sincerely,   Margo Aye, MD Allergy/Immunology [facility]

## 2022-11-27 NOTE — Patient Instructions (Addendum)
  1.  Allergen avoidance measures - dust mite, cat, dog, pollen, alternaria mold, tree nut  2.  Treat and prevent inflammation:  A.  Breztri -2 puffs 1 times a day with spacer B.  Budesonide nasal rinses twice a day as directed by ENT.  C.  Zyrtec 10mg  daily  3.  If needed:  A. Albuterol HFA - 2 inhalations every 4-6 hours  C. Epi-Pen, benadryl, MD/ER evaluation for allergic reaction  4.  Discussed Nucala and Tezspire options to help with nasal polyp control.  Both help to target eosinophil production which is a component of polyp and polyp growth.  Let us know when ready to proceed with a biologic option  5.  Consider allergen immunotherapy for your environmental allergens if allergy medication is not effective enough  6. Return to clinic in 6 months or sooner needed

## 2022-11-28 LAB — HEPATITIS C ANTIBODY: Hepatitis C Ab: NONREACTIVE

## 2022-11-30 NOTE — Progress Notes (Signed)
Anemia some better but  still present .  Iron deficiently.   WBC up some uncertain why but sometimes from medication.   Blood sugar ok but A1c is at 6.5 in prediabetic range. Thyroid is normal .  I advise  taking iron supplement every day  or every other day you can remember   Advise Repeat  cbc diff and hg A1c  in 4-6 months  dx anemia

## 2023-01-01 IMAGING — DX DG CHEST 1V PORT
1 series · 1 of 1 positions shown · non-contrast
Comparison: 11/19/2020

CLINICAL DATA: Dyspnea

EXAM:
PORTABLE CHEST 1 VIEW

[chest]
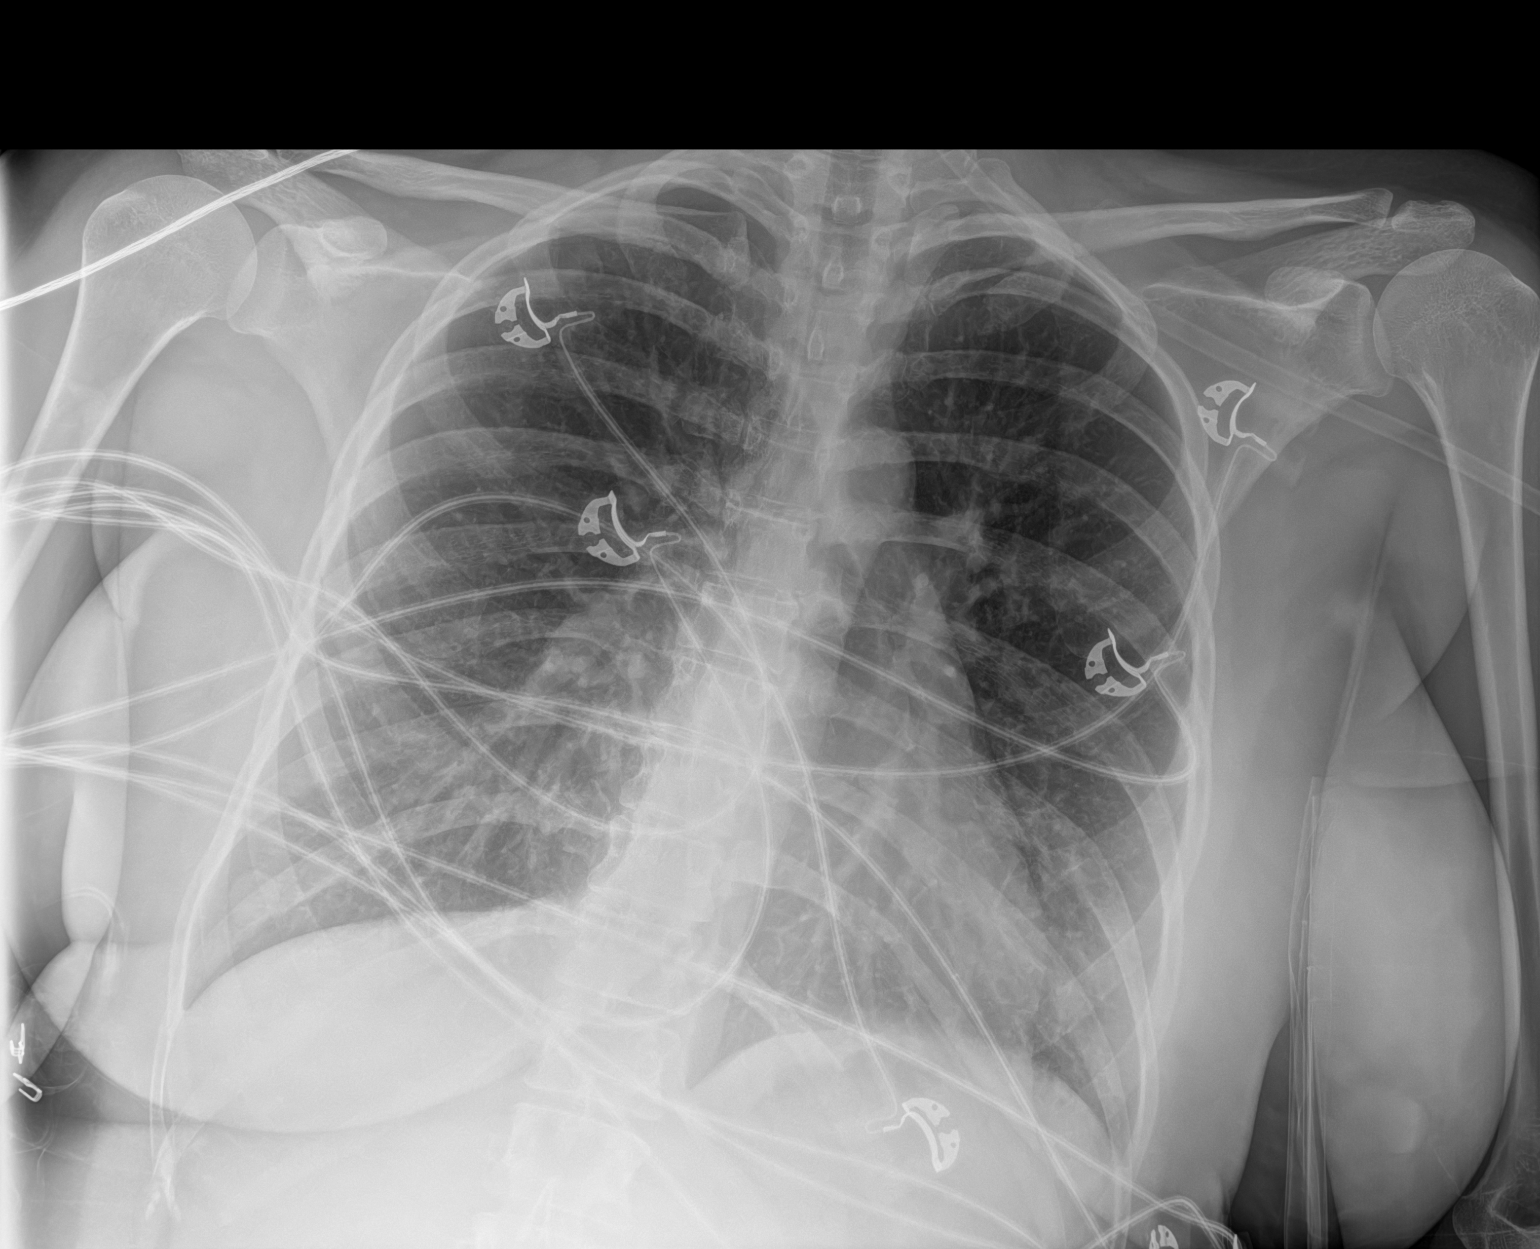

[1 of 1 positions shown; findings below may reference images not displayed]

FINDINGS: The heart size and mediastinal contours are within normal limits.
Both lungs are clear. Nodular density at the right costophrenic
angle likely represents a nipple shadow. The visualized skeletal
structures are unremarkable.
IMPRESSION: No active disease.

## 2023-03-04 ENCOUNTER — Other Ambulatory Visit: Payer: Self-pay | Admitting: Family

## 2023-05-21 ENCOUNTER — Other Ambulatory Visit: Payer: Self-pay | Admitting: Internal Medicine

## 2023-05-21 NOTE — Telephone Encounter (Signed)
 Copied from CRM [id number]. Topic: Clinical - Medication Refill >> May 21, 2023  2:08 PM Antwanette L wrote: Most Recent Primary Care Visit:  Provider: Daphine Eagle K  Department: [facility]  Visit Type: PHYSICAL  Date: 11/27/2022  Medication: escitalopram (LEXAPRO) 10 MG tablet   Has the patient contacted their pharmacy? Yes   Is this the correct pharmacy for this prescription? Yes  [id number] - [place], [place] - [address] [address] Phone: [phone] Fax: [phone]   Has the prescription been filled recently? No. Last refilled on 05/28/22  Is the patient out of the medication? Yes  Has the patient been seen for an appointment in the last year OR does the patient have an upcoming appointment? Yes. Last ov was 11/27/22  Can we respond through MyChart? No.Contact patient by phone at [phone]  Agent: Please be advised that Rx refills may take up to 3 business days. We ask that you follow-up with your pharmacy.

## 2023-05-25 MED ORDER — ESCITALOPRAM OXALATE 10 MG PO TABS: 10.0000 mg | ORAL_TABLET | Freq: Every day | ORAL | 1 refills | Status: DC

## 2023-05-25 NOTE — Telephone Encounter (Signed)
 Contacted pt. Schedule pt a virtual appt for med check.

## 2023-05-26 ENCOUNTER — Ambulatory Visit: Payer: Self-pay | Admitting: Family

## 2023-05-28 ENCOUNTER — Ambulatory Visit: Payer: BC Managed Care – PPO | Admitting: Allergy

## 2023-06-03 ENCOUNTER — Telehealth (INDEPENDENT_AMBULATORY_CARE_PROVIDER_SITE_OTHER): Payer: Self-pay | Admitting: Internal Medicine

## 2023-06-03 ENCOUNTER — Other Ambulatory Visit: Payer: Self-pay | Admitting: Internal Medicine

## 2023-06-03 ENCOUNTER — Encounter: Payer: Self-pay | Admitting: Internal Medicine

## 2023-06-03 VITALS — Ht 62.99 in | Wt 172.0 lb

## 2023-06-03 DIAGNOSIS — E7800 Pure hypercholesterolemia, unspecified: Secondary | ICD-10-CM

## 2023-06-03 DIAGNOSIS — Z0000 Encounter for general adult medical examination without abnormal findings: Secondary | ICD-10-CM

## 2023-06-03 DIAGNOSIS — F419 Anxiety disorder, unspecified: Secondary | ICD-10-CM

## 2023-06-03 DIAGNOSIS — Z79899 Other long term (current) drug therapy: Secondary | ICD-10-CM

## 2023-06-03 DIAGNOSIS — D649 Anemia, unspecified: Secondary | ICD-10-CM

## 2023-06-03 NOTE — Progress Notes (Signed)
 Virtual Visit via Video Note  I connected with Philemon Braver on 06/03/23 at  4:15 PM EDT by a video enabled telemedicine application and verified that I am speaking with the correct person using two identifiers. Location patient: home vehicle Location provider:work office Persons participating in the virtual visit: patient, provider   Patient aware  of the limitations of evaluation and management by telemedicine and  availability of in person appointments. and agreed to proceed.   HPI: Brittany Simon presents for video visit  fu lexapro  for  anxiety med  seems to be working as expected and quality of life  is better on. Gets some days of premenstrual anxiety aggravation but coping with that.  Asthma is controlled at this time .  depr and gad 7  2  not difficult  ROS: See pertinent positives and negatives per HPI.  Past Medical History:  Diagnosis Date   Acute respiratory failure with hypoxia (HCC) 01/10/2021   Allergy    Anemia 02/2018   Asthma    Infectious mononucleosis 09/26/2013   presenting with has and periorbital swelling off and on no other alarm features    Prematurity, fetus 35-36 completed weeks of gestation     4 40z 36 weeks    Past Surgical History:  Procedure Laterality Date   GASTROC RECESSION EXTREMITY Bilateral 07/18/2022   Procedure: NASOFRONTAL RECESS EXPLORATION;  Surgeon: Daleen Dubs, DO;  Location: [facility] OR;  Service: ENT;  Laterality: Bilateral;   NASAL TURBINATE REDUCTION Bilateral 07/18/2022   Procedure: INFERIOR TURBINATE REDUCTION;  Surgeon: Daleen Dubs, DO;  Location: [facility] OR;  Service: ENT;  Laterality: Bilateral;   NO PAST SURGERIES     POLYPECTOMY Bilateral 07/18/2022   Procedure: POLYPECTOMY NASAL;  Surgeon: Daleen Dubs, DO;  Location: [facility] OR;  Service: ENT;  Laterality: Bilateral;   SEPTOPLASTY Bilateral 07/18/2022   Procedure: SEPTOPLASTY;  Surgeon: Daleen Dubs, DO;  Location: [facility];  Service: ENT;  Laterality:  Bilateral;   SEPTOPLASTY WITH ETHMOIDECTOMY, AND MAXILLARY ANTROSTOMY Bilateral 07/18/2022   Procedure: TOTAL ETHMOIDECTOMY, AND MAXILLARY ANTROSTOMY;  Surgeon: Daleen Dubs, DO;  Location: [facility];  Service: ENT;  Laterality: Bilateral;   SINUS ENDO WITH FUSION Bilateral 07/18/2022   Procedure: SINUS ENDO WITH FUSION;  Surgeon: Daleen Dubs, DO;  Location: [facility];  Service: ENT;  Laterality: Bilateral;   SPHENOIDECTOMY Bilateral 07/18/2022   Procedure: SPHENOIDECTOMY;  Surgeon: Daleen Dubs, DO;  Location: [facility];  Service: ENT;  Laterality: Bilateral;   TURBINATE REDUCTION Bilateral 07/18/2022   Procedure: INFERIOR TURBINATE REDUCTION;  Surgeon: Daleen Dubs, DO;  Location: [facility];  Service: ENT;  Laterality: Bilateral;    Family History  Problem Relation Age of Onset   Colon polyps Mother    Asthma Father    Colon cancer Neg Hx    Esophageal cancer Neg Hx    Rectal cancer Neg Hx    Stomach cancer Neg Hx     Social History   Tobacco Use   Smoking status: Never   Smokeless tobacco: Never  Vaping Use   Vaping status: Never Used  Substance Use Topics   Alcohol use: Yes    Comment: occassionally    Drug use: No      Current Outpatient Medications:    albuterol  (PROVENTIL ) (2.5 MG/3ML) 0.083% nebulizer solution, Take 3 mLs (2.5 mg total) by nebulization every 4 (four) hours as needed for wheezing or shortness of breath., Disp: 150 mL, Rfl: 0  albuterol  (VENTOLIN  HFA) 108 (90 Base) MCG/ACT inhaler, INHALE 2 PUFFS EVERY 6 HOURS AS NEEDED FOR COUGH, WHEEZE, TIGHTNESS IN CHEST, OR SHORTNESS OF BREATH, Disp: 18 each, Rfl: 1   Budeson-Glycopyrrol-Formoterol (BREZTRI  AEROSPHERE) 160-9-4.8 MCG/ACT AERO, Inhale 2 puffs twice a day with spacer to help prevent cough and wheeze, Disp: 10.7 g, Rfl: 5   budesonide (PULMICORT) 0.5 MG/2ML nebulizer solution, Take 0.5 mg by nebulization daily., Disp: , Rfl:    cetirizine  (ZYRTEC ) 10 MG tablet, Take 1 tablet once a day as  needed for runny nose, Disp: 30 tablet, Rfl: 5   EPINEPHrine  0.3 mg/0.3 mL IJ SOAJ injection, Inject 0.3 mg into the muscle as directed., Disp: 2 each, Rfl: 1   escitalopram  (LEXAPRO ) 10 MG tablet, Take 1 tablet (10 mg total) by mouth daily. Dosage adjustment, Disp: 90 tablet, Rfl: 1  EXAM: BP Readings from Last 3 Encounters:  11/27/22 112/80  11/27/22 104/70  07/18/22 116/79    VITALS per patient if applicable:  GENERAL: alert, oriented, appears well and in no acute distress  HEENT: atraumatic, conjunttiva clear, no obvious abnormalities on inspection of external nose and ears  NECK: normal movements of the head and neck  LUNGS: on inspection no signs of respiratory distress, breathing rate appears normal, no obvious gross SOB, gasping or wheezing  CV: no obvious cyanosis  MS: moves all visible extremities without noticeable abnormality  PSYCH/NEURO: pleasant and cooperative, no obvious depression or anxiety, speech and thought processing grossly intact Lab Results  Component Value Date   WBC 14.7 (H) 11/27/2022   HGB 10.4 (L) 11/27/2022   HCT 34.7 (L) 11/27/2022   PLT 399.0 11/27/2022   GLUCOSE 82 11/27/2022   CHOL 190 11/27/2022   TRIG 119.0 11/27/2022   HDL 48.90 11/27/2022   LDLCALC 118 (H) 11/27/2022   ALT 10 11/27/2022   AST 13 11/27/2022   NA 138 11/27/2022   K 3.7 11/27/2022   CL 104 11/27/2022   CREATININE 0.55 11/27/2022   BUN 10 11/27/2022   CO2 27 11/27/2022   TSH 1.38 11/27/2022   HGBA1C 6.5 11/27/2022    ASSESSMENT AND PLAN:  Discussed the following assessment and plan:    ICD-10-CM   1. Anxiety  F41.9     2. Medication management  Z79.899      Medication benefit more than risk  to continue   pre menstrual sx not out of range  Counseled.  Cont lexapro   Plan labs and then cpe visit in about 6 months future orders to be placed   Expectant management and discussion of plan and treatment with opportunity to ask questions and all were answered.  The patient agreed with the plan and demonstrated an understanding of the instructions.   Advised to call back or seek an in-person evaluation if worsening  or having  further concerns  in interim. Return in about 6 months (around 12/03/2023) for labs  appt and then cpe  med check in about 6 months.    Daphine Eagle, MD

## 2023-06-03 NOTE — Progress Notes (Signed)
 Future labs TBD  pre cpe in 6 mos

## 2023-06-04 ENCOUNTER — Ambulatory Visit: Payer: Self-pay | Admitting: Family Medicine

## 2023-06-04 DIAGNOSIS — J309 Allergic rhinitis, unspecified: Secondary | ICD-10-CM

## 2023-06-04 NOTE — Progress Notes (Deleted)
   [address] [place] [address] Dept: [phone]  FOLLOW UP NOTE  Patient ID: Brittany Simon, female    DOB: May 19, 1995  Age: 28 y.o. MRN: [id number] Date of Office Visit: 06/04/2023  Assessment  Chief Complaint: No chief complaint on file.  HPI Brittany Simon is a 28 year old female who presents to the clinic for follow-up visit.  She was last seen in this clinic on 11/27/2018 for by Dr. Tempie Fee for evaluation of asthma, allergic rhinitis, nasal polyposis, and food allergy to tree nut.  Her last environmental allergy skin testing was on 12/30/2020 was positive to pollen, mold, dust mite, cat, and dog.  Her last food allergy testing to the top 10 most allergenic foods on 12/30/2020 was borderline positive to soy, negative to tree nuts.  Lab testing on 03/18/2021 was positive to tree nuts.  Discussed the use of AI scribe software for clinical note transcription with the patient, who gave verbal consent to proceed.  History of Present Illness      Drug Allergies:  Allergies  Allergen Reactions   Aspirin Anaphylaxis, Itching and Swelling   Dupilumab  Other (See Comments)    Pink eye    Physical Exam: LMP 05/20/2023 (Exact Date)    Physical Exam  Diagnostics:    Assessment and Plan: No diagnosis found.  No orders of the defined types were placed in this encounter.   There are no Patient Instructions on file for this visit.  No follow-ups on file.    Thank you for the opportunity to care for this patient.  Please do not hesitate to contact me with questions.  Marinus Sic, FNP [facility]

## 2023-06-14 NOTE — Patient Instructions (Signed)
 Asthma Increase Breztri  to 2 puffs twice a day with a spacer to prevent cough or wheeze Continue albuterol  2 puffs once every 4 hours if needed for cough or wheeze You may use albuterol  2 puffs 5 to 15 minutes before activity to decrease cough or wheeze  Allergic rhinitis Continue allergen avoidance measures directed toward pollen, mold, dust mite, cat, and dog as listed below Continue cetirizine  10 mg once a day if needed for runny nose or itch Continue budesonide plus saline rinses as directed by your ENT specialist Consider allergen immunotherapy if your symptoms are not well-controlled with the treatment plan as listed above  Nasal polyposis Continue budesonide plus saline rinses as directed by your ENT specialist Continue to follow-up with your ENT specialist as recommended  Food allergy Continue to avoid tree nuts.  In case of an allergic reaction, take Benadryl  50 mg every 4 hours, and if life-threatening symptoms occur, inject with EpiPen  0.3 mg. Consider updating your food allergy testing.  Remember to stop antihistamines for 3 days before your testing appointment  Call the clinic if this treatment plan is not working well for you.  Follow up in 3 months or sooner if needed.  Reducing Pollen Exposure The American Academy of Allergy, Asthma and Immunology suggests the following steps to reduce your exposure to pollen during allergy seasons. Do not hang sheets or clothing out to dry; pollen may collect on these items. Do not mow lawns or spend time around freshly cut grass; mowing stirs up pollen. Keep windows closed at night.  Keep car windows closed while driving. Minimize morning activities outdoors, a time when pollen counts are usually at their highest. Stay indoors as much as possible when pollen counts or humidity is high and on windy days when pollen tends to remain in the air longer. Use air conditioning when possible.  Many air conditioners have filters that trap the  pollen spores. Use a HEPA room air filter to remove pollen form the indoor air you breathe.  Control of Mold Allergen Mold and fungi can grow on a variety of surfaces provided certain temperature and moisture conditions exist.  Outdoor molds grow on plants, decaying vegetation and soil.  The major outdoor mold, Alternaria and Cladosporium, are found in very high numbers during hot and dry conditions.  Generally, a late Summer - Fall peak is seen for common outdoor fungal spores.  Rain will temporarily lower outdoor mold spore count, but counts rise rapidly when the rainy period ends.  The most important indoor molds are Aspergillus and Penicillium.  Dark, humid and poorly ventilated basements are ideal sites for mold growth.  The next most common sites of mold growth are the bathroom and the kitchen.  Outdoor [employer] Use air conditioning and keep windows closed Avoid exposure to decaying vegetation. Avoid leaf raking. Avoid grain handling. Consider wearing a face mask if working in moldy areas.  Indoor Mold Control Maintain humidity below 50%. Clean washable surfaces with 5% bleach solution. Remove sources e.g. Contaminated carpets.   Control of Dust Mite Allergen Dust mites play a major role in allergic asthma and rhinitis. They occur in environments with high humidity wherever human skin is found. Dust mites absorb humidity from the atmosphere (ie, they do not drink) and feed on organic matter (including shed human and animal skin). Dust mites are a microscopic type of insect that you cannot see with the naked eye. High levels of dust mites have been detected from mattresses, pillows, carpets, upholstered  furniture, bed covers, clothes, soft toys and any woven material. The principal allergen of the dust mite is found in its feces. A gram of dust may contain 1,000 mites and 250,000 fecal particles. Mite antigen is easily measured in the air during house cleaning activities. Dust mites do  not bite and do not cause harm to humans, other than by triggering allergies/asthma.  Ways to decrease your exposure to dust mites in your home:  1. Encase mattresses, box springs and pillows with a mite-impermeable barrier or cover  2. Wash sheets, blankets and drapes weekly in hot water (130 F) with detergent and dry them in a dryer on the hot setting.  3. Have the room cleaned frequently with a vacuum cleaner and a damp dust-mop. For carpeting or rugs, vacuuming with a vacuum cleaner equipped with a high-efficiency particulate air (HEPA) filter. The dust mite allergic individual should not be in a room which is being cleaned and should wait 1 hour after cleaning before going into the room.  4. Do not sleep on upholstered furniture (eg, couches).  5. If possible removing carpeting, upholstered furniture and drapery from the home is ideal. Horizontal blinds should be eliminated in the rooms where the person spends the most time (bedroom, study, television room). Washable vinyl, roller-type shades are optimal.  6. Remove all non-washable stuffed toys from the bedroom. Wash stuffed toys weekly like sheets and blankets above.  7. Reduce indoor humidity to less than 50%. Inexpensive humidity monitors can be purchased at most hardware stores. Do not use a humidifier as can make the problem worse and are not recommended.  Control of Dog or Cat Allergen Avoidance is the best way to manage a dog or cat allergy. If you have a dog or cat and are allergic to dog or cats, consider removing the dog or cat from the home. If you have a dog or cat but don't want to find it a new home, or if your family wants a pet even though someone in the household is allergic, here are some strategies that may help keep symptoms at bay:  Keep the pet out of your bedroom and restrict it to only a few rooms. Be advised that keeping the dog or cat in only one room will not limit the allergens to that room. Don't pet, hug or  kiss the dog or cat; if you do, wash your hands with soap and water. High-efficiency particulate air (HEPA) cleaners run continuously in a bedroom or living room can reduce allergen levels over time. Regular use of a high-efficiency vacuum cleaner or a central vacuum can reduce allergen levels. Giving your dog or cat a bath at least once a week can reduce airborne allergen.

## 2023-06-14 NOTE — Progress Notes (Unsigned)
   [address] [place] [address] Dept: [phone]  FOLLOW UP NOTE  Patient ID: Brittany Simon, female    DOB: 11/29/1995  Age: 28 y.o. MRN: [id number] Date of Office Visit: 06/15/2023  Assessment  Chief Complaint: No chief complaint on file.  HPI  AURIELLE Simon is a 28 year old female who presents to the clinic for follow-up visit.  She was last seen in this clinic on 11/27/2022 by Dr. Tempie Fee for evaluation of asthma, allergic rhinitis, nasal polyposis, and food allergy to tree nuts.  Her last environmental allergy skin testing was on 12/30/2020 was positive to pollen, mold, dust mite, cat, and dog.  Her last food allergy testing to the top 10 most allergenic foods on 12/30/2020 was borderline positive to soy, negative to tree nuts.  Lab testing on 03/18/2021 was positive to tree nuts.  Her last visit to her ENT specialist was on 04/24/2023 and no polyps were noted  on rhinoscopy.  Discussed the use of AI scribe software for clinical note transcription with the patient, who gave verbal consent to proceed.  History of Present Illness      Drug Allergies:  Allergies  Allergen Reactions   Aspirin Anaphylaxis, Itching and Swelling   Dupilumab  Other (See Comments)    Pink eye    Physical Exam: LMP 05/20/2023 (Exact Date)    Physical Exam  Diagnostics:    Assessment and Plan: No diagnosis found.  No orders of the defined types were placed in this encounter.   There are no Patient Instructions on file for this visit.  No follow-ups on file.    Thank you for the opportunity to care for this patient.  Please do not hesitate to contact me with questions.  Marinus Sic, FNP [facility]

## 2023-06-15 ENCOUNTER — Encounter: Payer: Self-pay | Admitting: Family Medicine

## 2023-06-15 ENCOUNTER — Encounter: Payer: Self-pay | Admitting: Internal Medicine

## 2023-06-15 ENCOUNTER — Ambulatory Visit (INDEPENDENT_AMBULATORY_CARE_PROVIDER_SITE_OTHER): Payer: Self-pay | Admitting: Family Medicine

## 2023-06-15 VITALS — BP 98/62 | HR 78 | Temp 98.3°F | Resp 12 | Ht 63.0 in | Wt 166.0 lb

## 2023-06-15 DIAGNOSIS — J339 Nasal polyp, unspecified: Secondary | ICD-10-CM

## 2023-06-15 DIAGNOSIS — J302 Other seasonal allergic rhinitis: Secondary | ICD-10-CM | POA: Insufficient documentation

## 2023-06-15 DIAGNOSIS — J3089 Other allergic rhinitis: Secondary | ICD-10-CM

## 2023-06-15 DIAGNOSIS — J4530 Mild persistent asthma, uncomplicated: Secondary | ICD-10-CM | POA: Diagnosis not present

## 2023-06-15 DIAGNOSIS — T7800XA Anaphylactic reaction due to unspecified food, initial encounter: Secondary | ICD-10-CM | POA: Insufficient documentation

## 2023-06-15 DIAGNOSIS — T7800XD Anaphylactic reaction due to unspecified food, subsequent encounter: Secondary | ICD-10-CM

## 2023-06-15 MED ORDER — EPINEPHRINE 0.3 MG/0.3ML IJ SOAJ: 0.3000 mg | INTRAMUSCULAR | 1 refills | Status: AC

## 2023-06-15 MED ORDER — LEVALBUTEROL TARTRATE 45 MCG/ACT IN AERO
4.0000 | INHALATION_SPRAY | Freq: Once | RESPIRATORY_TRACT | Status: AC
  Administered 2023-06-15: 4 via RESPIRATORY_TRACT

## 2023-06-15 MED ORDER — BREZTRI AEROSPHERE 160-9-4.8 MCG/ACT IN AERO: INHALATION_SPRAY | RESPIRATORY_TRACT | 5 refills | Status: AC

## 2023-10-19 ENCOUNTER — Other Ambulatory Visit: Payer: Self-pay | Admitting: Allergy

## 2023-12-03 ENCOUNTER — Ambulatory Visit: Admitting: Internal Medicine

## 2023-12-03 ENCOUNTER — Encounter: Payer: Self-pay | Admitting: Internal Medicine

## 2023-12-03 VITALS — BP 102/78 | HR 79 | Temp 97.5°F | Ht 62.56 in | Wt 167.4 lb

## 2023-12-03 DIAGNOSIS — Z0000 Encounter for general adult medical examination without abnormal findings: Secondary | ICD-10-CM | POA: Diagnosis not present

## 2023-12-03 DIAGNOSIS — F419 Anxiety disorder, unspecified: Secondary | ICD-10-CM | POA: Diagnosis not present

## 2023-12-03 DIAGNOSIS — Z833 Family history of diabetes mellitus: Secondary | ICD-10-CM | POA: Diagnosis not present

## 2023-12-03 DIAGNOSIS — Z79899 Other long term (current) drug therapy: Secondary | ICD-10-CM

## 2023-12-03 NOTE — Patient Instructions (Signed)
 Good to see you today . Limit simple sugars and processed foods ( sweet tea)   Want to avoid getting diabetes as best possible.   Get appt for fasting labs as we discussed .  Get your flu shot this season.

## 2023-12-03 NOTE — Progress Notes (Signed)
 Chief Complaint  Patient presents with   Annual Exam    HPI: Patient  Brittany Simon  28 y.o. comes in today for Preventive Health Care visit  Asthma controlled  except around incitors  doing ok  Lexapro  helpful wants to continue   Health Maintenance  Topic Date Due   Pneumococcal Vaccine (2 of 2 - PPSV23, PCV20, or PCV21) 08/28/2001   COVID-19 Vaccine (4 - 2025-26 season) 12/19/2023 (Originally 10/05/2023)   Influenza Vaccine  05/03/2024 (Originally 09/04/2023)   Cervical Cancer Screening (Pap smear)  04/19/2024   DTaP/Tdap/Td (9 - Td or Tdap) 01/09/2027   Hepatitis B Vaccines 19-59 Average Risk  Completed   HPV VACCINES  Completed   HIV Screening  Completed   Meningococcal B Vaccine  Aged Out   Hepatitis C Screening  Discontinued   Health Maintenance Review LIFESTYLE:  Exercise:   walks dog  and out .  Tobacco/ETS: n Alcohol:  3 x per month  Sugar beverages: sweet tea   3-4 x per week.  Sleep: at least 8  Drug use: no HH of  4 pet dog  Work: 8 hours   5 x    Reg periods    utd on pap .  Family hx of dm gestational  ROS:  GEN/ HEENT: No fever, significant weight changes sweats headaches vision problems hearing changes, CV/ PULM; No chest pain shortness of breath cough, syncope,edema  change in exercise tolerance. GI /GU: No adominal pain, vomiting, change in bowel habits. No blood in the stool. No significant GU symptoms. SKIN/HEME: ,no acute skin rashes suspicious lesions or bleeding. No lymphadenopathy, nodules, masses.  NEURO/ PSYCH:  No neurologic signs such as weakness numbness. No depression anxiety. IMM/ Allergy: No unusual infections.  Allergy .   REST of 12 system review negative except as per HPI   Past Medical History:  Diagnosis Date   Acute respiratory failure with hypoxia (HCC) 01/10/2021   Allergy    Anemia 02/2018   Asthma    Infectious mononucleosis 09/26/2013   presenting with has and periorbital swelling off and on no other alarm features     Prematurity, fetus 35-36 completed weeks of gestation     4 40z 36 weeks    Past Surgical History:  Procedure Laterality Date   GASTROC RECESSION EXTREMITY Bilateral 07/18/2022   Procedure: NASOFRONTAL RECESS EXPLORATION;  Surgeon: Llewellyn Gerard LABOR, DO;  Location: [facility] OR;  Service: ENT;  Laterality: Bilateral;   NASAL TURBINATE REDUCTION Bilateral 07/18/2022   Procedure: INFERIOR TURBINATE REDUCTION;  Surgeon: Llewellyn Gerard LABOR, DO;  Location: [facility] OR;  Service: ENT;  Laterality: Bilateral;   NO PAST SURGERIES     POLYPECTOMY Bilateral 07/18/2022   Procedure: POLYPECTOMY NASAL;  Surgeon: Llewellyn Gerard LABOR, DO;  Location: [facility] OR;  Service: ENT;  Laterality: Bilateral;   SEPTOPLASTY Bilateral 07/18/2022   Procedure: SEPTOPLASTY;  Surgeon: Llewellyn Gerard LABOR, DO;  Location: [facility];  Service: ENT;  Laterality: Bilateral;   SEPTOPLASTY WITH ETHMOIDECTOMY, AND MAXILLARY ANTROSTOMY Bilateral 07/18/2022   Procedure: TOTAL ETHMOIDECTOMY, AND MAXILLARY ANTROSTOMY;  Surgeon: Llewellyn Gerard LABOR, DO;  Location: [facility];  Service: ENT;  Laterality: Bilateral;   SINUS ENDO WITH FUSION Bilateral 07/18/2022   Procedure: SINUS ENDO WITH FUSION;  Surgeon: Llewellyn Gerard LABOR, DO;  Location: [facility];  Service: ENT;  Laterality: Bilateral;   SPHENOIDECTOMY Bilateral 07/18/2022   Procedure: CLEONE;  Surgeon: Skotnicki, Meghan A, DO;  Location: [facility];  Service: ENT;  Laterality: Bilateral;  TURBINATE REDUCTION Bilateral 07/18/2022   Procedure: INFERIOR TURBINATE REDUCTION;  Surgeon: Llewellyn Gerard LABOR, DO;  Location: [facility];  Service: ENT;  Laterality: Bilateral;    Family History  Problem Relation Age of Onset   Colon polyps Mother    Asthma Father    Colon cancer Neg Hx    Esophageal cancer Neg Hx    Rectal cancer Neg Hx    Stomach cancer Neg Hx     Social History   Socioeconomic History   Marital status: Single    Spouse name: Not on file   Number of children: 0   Years of education: Not on  file   Highest education level: Not on file  Occupational History   Occupation: teacher  Tobacco Use   Smoking status: Never   Smokeless tobacco: Never  Vaping Use   Vaping status: Never Used  Substance and Sexual Activity   Alcohol use: Yes    Comment: occassionally    Drug use: No   Sexual activity: Not Currently  Other Topics Concern   Not on file  Social History Narrative   Single   Orig from [facility] of [facility]  One dog      IB program.    [place].   [employer] fall 15   [employer]s sc[employer]    Education major, spanish minor    [employer]  4th grade    Social Drivers of Corporate Investment Banker Strain: Low Risk  (11/27/2022)   Overall Financial Resource Strain (CARDIA)    Difficulty of Paying Living Expenses: Not hard at all  Food Insecurity: Low Risk  (11/10/2022)   Received from [facility]   Hunger Vital Sign    Within the past 12 months, you worried that your food would run out before you got money to buy more: Never true    Within the past 12 months, the food you bought just didn't last and you didn't have money to get more. : Never true  Transportation Needs: No Transportation Needs (11/10/2022)   Received from [facility]    In the past 12 months, has lack of reliable transportation kept you from medical appointments, meetings, work or from getting things needed for daily living? : No  Physical Activity: Insufficiently Active (11/27/2022)   Exercise Vital Sign    Days of Exercise per Week: 3 days    Minutes of Exercise per Session: 30 min  Stress: No Stress Concern Present (11/27/2022)   [employer] of Occupational Health - Occupational Stress Questionnaire    Feeling of Stress : Not at all  Social Connections: Socially Integrated (11/27/2022)   Social Connection and Isolation Panel    Frequency of Communication with Friends and Family: More than three times a week    Frequency of Social Gatherings with Friends and Family: Once a week     Attends Religious Services: More than 4 times per year    Active Member of [employer] or Organizations: Yes    Attends Engineer, Structural: More than 4 times per year    Marital Status: Living with partner    Outpatient Medications Prior to Visit  Medication Sig Dispense Refill   albuterol  (PROVENTIL ) (2.5 MG/3ML) 0.083% nebulizer solution TAKE 3 ML (2.5 MG TOTAL) BY NEBULIZATION EVERY 4 HOURS AS NEEDED FOR WHEEZING OR SHORTNESS OF BREATH 150 mL 0   albuterol  (VENTOLIN  HFA) 108 (90 Base) MCG/ACT inhaler INHALE 2 PUFFS EVERY 6 HOURS AS NEEDED  FOR COUGH, WHEEZE, TIGHTNESS IN CHEST, OR SHORTNESS OF BREATH 18 each 1   budeson-glycopyrrolate-formoterol (BREZTRI  AEROSPHERE) 160-9-4.8 MCG/ACT AERO inhaler Inhale 2 puffs twice a day with spacer to help prevent cough and wheeze 10.7 g 5   budesonide (PULMICORT) 0.5 MG/2ML nebulizer solution Take 0.5 mg by nebulization daily.     cetirizine  (ZYRTEC ) 10 MG tablet Take 1 tablet once a day as needed for runny nose 30 tablet 5   EPINEPHrine  0.3 mg/0.3 mL IJ SOAJ injection Inject 0.3 mg into the muscle as directed. 2 each 1   escitalopram  (LEXAPRO ) 10 MG tablet Take 1 tablet (10 mg total) by mouth daily. Dosage adjustment 90 tablet 1   No facility-administered medications prior to visit.     EXAM:  BP 102/78 (BP Location: Left Arm, Patient Position: Sitting, Cuff Size: Large)   Pulse 79   Temp (!) 97.5 F (36.4 C) (Oral)   Ht 5' 2.56 (1.589 m)   Wt 167 lb 6.4 oz (75.9 kg)   LMP 11/22/2023 (Exact Date)   SpO2 96%   BMI 30.07 kg/m   Body mass index is 30.07 kg/m. Wt Readings from Last 3 Encounters:  12/03/23 167 lb 6.4 oz (75.9 kg)  06/15/23 166 lb (75.3 kg)  06/03/23 172 lb (78 kg)    Physical Exam: Vital signs reviewed HZW:Uypd is a well-developed well-nourished alert cooperative    who appearsr stated age in no acute distress.  Mildly congested  HEENT: normocephalic atraumatic , Eyes: PERRL EOM's full, conjunctiva clear,  Nares: paten,t no deformity discharge or tenderness., Ears: no deformity EAC's 1+ wax  TMs with normal landmarks. Mouth: clear OP, no lesions, edema.  Moist mucous membranes. Dentition in adequate repair. NECK: supple without masses,  or bruits.thyroid  palpable no mass CHEST/PULM:  Clear to auscultation and percussion breath sounds equal no wheeze , rales or rhonchi. Breast:deferred per gyne CV: PMI is nondisplaced, S1 S2 no gallops, murmurs, rubs. Peripheral pulses are full without delay.No JVD .  ABDOMEN: Bowel sounds normal nontender  No guard or rebound, no hepato splenomegal no CVA tenderness.  . Extremtities:  No clubbing cyanosis or edema, no acute joint swelling or redness no focal atrophy NEURO:  Oriented x3, cranial nerves 3-12 appear to be intact, no obvious focal weakness,gait within normal limits no abnormal reflexes or asymmetrical SKIN: No acute rashes normal turgor, color, no bruising or petechiae. PSYCH: Oriented, good eye contact, no obvious depression anxiety, cognition and judgment appear normal. LN: no cervical axillary adenopathy  Lab Results  Component Value Date   WBC 14.7 (H) 11/27/2022   HGB 10.4 (L) 11/27/2022   HCT 34.7 (L) 11/27/2022   PLT 399.0 11/27/2022   GLUCOSE 82 11/27/2022   CHOL 190 11/27/2022   TRIG 119.0 11/27/2022   HDL 48.90 11/27/2022   LDLCALC 118 (H) 11/27/2022   ALT 10 11/27/2022   AST 13 11/27/2022   NA 138 11/27/2022   K 3.7 11/27/2022   CL 104 11/27/2022   CREATININE 0.55 11/27/2022   BUN 10 11/27/2022   CO2 27 11/27/2022   TSH 1.38 11/27/2022   HGBA1C 6.5 11/27/2022    BP Readings from Last 3 Encounters:  12/03/23 102/78  06/15/23 98/62  11/27/22 112/80    Lab plan  reviewed with patient   ASSESSMENT AND PLAN:  Discussed the following assessment and plan:    ICD-10-CM   1. Visit for preventive health examination  Z00.00     2. Medication management  [phone]  3. Family history of diabetes mellitus  Z83.3     4.  Anxiety  F41.9    controlled on med    Disc  diabetes prevention  with ls  ie limit stop sweet beverages etc  Allergy asthma control. Continue lexapro  if all ok can check yearly unless metabolic monitoring needed.  Make lab appt  forgot to fast today and says will follow through with fasting lab draw.  Return in about 1 year (around 12/02/2024) for 6-12 months depending on lab .  Patient Care Team: Rome Schlauch K, MD as PCP - General Patient Instructions  Good to see you today . Limit simple sugars and processed foods ( sweet tea)   Want to avoid getting diabetes as best possible.   Get appt for fasting labs as we discussed .  Get your flu shot this season.   Clotine Heiner K. Zanovia Rotz M.D.

## 2023-12-04 ENCOUNTER — Other Ambulatory Visit: Payer: Self-pay | Admitting: Allergy

## 2023-12-07 ENCOUNTER — Other Ambulatory Visit

## 2023-12-15 ENCOUNTER — Other Ambulatory Visit

## 2023-12-28 ENCOUNTER — Ambulatory Visit: Payer: Self-pay

## 2023-12-28 ENCOUNTER — Encounter: Payer: Self-pay | Admitting: Family Medicine

## 2023-12-28 ENCOUNTER — Ambulatory Visit: Admitting: Family Medicine

## 2023-12-28 VITALS — BP 110/68 | HR 120 | Temp 99.3°F | Wt 160.2 lb

## 2023-12-28 DIAGNOSIS — R059 Cough, unspecified: Secondary | ICD-10-CM | POA: Diagnosis not present

## 2023-12-28 DIAGNOSIS — J101 Influenza due to other identified influenza virus with other respiratory manifestations: Secondary | ICD-10-CM

## 2023-12-28 LAB — POC COVID19 BINAXNOW: SARS Coronavirus 2 Ag: NEGATIVE

## 2023-12-28 LAB — POCT INFLUENZA A/B
Influenza A, POC: NEGATIVE
Influenza B, POC: POSITIVE — AB

## 2023-12-28 MED ORDER — ALBUTEROL SULFATE (2.5 MG/3ML) 0.083% IN NEBU: 2.5000 mg | INHALATION_SOLUTION | RESPIRATORY_TRACT | 0 refills | Status: AC | PRN

## 2023-12-28 MED ORDER — OSELTAMIVIR PHOSPHATE 75 MG PO CAPS: 75.0000 mg | ORAL_CAPSULE | Freq: Two times a day (BID) | ORAL | 0 refills | Status: AC

## 2023-12-28 NOTE — Telephone Encounter (Signed)
 FYI Only or Action Required?: FYI only for provider: appointment scheduled on 12/28/2023.  Patient was last seen in primary care on 12/03/2023 by Panosh, Apolinar POUR, MD.  Called Nurse Triage reporting Cough.  Symptoms began Friday.  Interventions attempted: Nothing.  Symptoms are: gradually worsening.  Triage Disposition: See HCP Within 4 Hours (Or PCP Triage)  Patient/caregiver understands and will follow disposition?: Yes    Copied from CRM #[id number]. Topic: Clinical - Red Word Triage >> Dec 28, 2023  9:59 AM Carlyon D wrote: Red Word that prompted transfer to Nurse Triage: chest tightness, wheezing, dry cough Reason for Disposition  [1] MILD difficulty breathing (e.g., minimal/no SOB at rest, SOB with walking, pulse < 100) AND [2] still present when not coughing  Answer Assessment - Initial Assessment Questions 1. ONSET: When did the cough begin?      Friday 2. SEVERITY: How bad is the cough today?      moderate 3. SPUTUM: Describe the color of your sputum (e.g., none, dry cough; clear, white, yellow, green)     Dry cough, yellow 4. HEMOPTYSIS: Are you coughing up any blood? If Yes, ask: How much? (e.g., flecks, streaks, tablespoons, etc.)     no 5. DIFFICULTY BREATHING: Are you having difficulty breathing? If Yes, ask: How bad is it? (e.g., mild, moderate, severe)      mild 6. FEVER: Do you have a fever? If Yes, ask: What is your temperature, how was it measured, and when did it start?     no 7. CARDIAC HISTORY: Do you have any history of heart disease? (e.g., heart attack, congestive heart failure)      na 8. LUNG HISTORY: Do you have any history of lung disease?  (e.g., pulmonary embolus, asthma, emphysema)     asthma 9. PE RISK FACTORS: Do you have a history of blood clots? (or: recent major surgery, recent prolonged travel, bedridden)     na 10. OTHER SYMPTOMS: Do you have any other symptoms? (e.g., runny nose, wheezing, chest pain)        Wheezing, congestion, chest tightness at times r/t asthma 11. PREGNANCY: Is there any chance you are pregnant? When was your last menstrual period?       na 12. TRAVEL: Have you traveled out of the country in the last month? (e.g., travel history, exposures)       Na  Pt stated asthma flare up.  Protocols used: Cough - Acute Non-Productive-A-AH

## 2023-12-28 NOTE — Progress Notes (Signed)
   Subjective:    Patient ID: Brittany Simon, female    DOB: Jun 19, 1995, 28 y.o.   MRN: [id number]  HPI Here for 4 days of low grade fevers, body aches, a dry cough, mild SOB, and fatigue. She has tried Delsym and Tylenol .    Review of Systems  Constitutional:  Positive for fatigue and fever.  HENT:  Positive for congestion. Negative for ear pain, postnasal drip, sinus pain and sore throat.   Eyes: Negative.   Respiratory:  Positive for cough and shortness of breath.   Cardiovascular: Negative.   Gastrointestinal: Negative.        Objective:   Physical Exam Constitutional:      Appearance: Normal appearance.  HENT:     Right Ear: Tympanic membrane, ear canal and external ear normal.     Left Ear: Tympanic membrane, ear canal and external ear normal.     Nose: Nose normal.     Mouth/Throat:     Pharynx: Oropharynx is clear.  Eyes:     Conjunctiva/sclera: Conjunctivae normal.  Pulmonary:     Effort: Pulmonary effort is normal.     Breath sounds: Normal breath sounds.  Lymphadenopathy:     Cervical: No cervical adenopathy.  Neurological:     Mental Status: She is alert.           Assessment & Plan:  Influenza B. Treat with 5 days of Tamiflu .  Garnette Olmsted, MD

## 2023-12-30 ENCOUNTER — Other Ambulatory Visit: Payer: Self-pay | Admitting: Internal Medicine

## 2024-03-15 ENCOUNTER — Institutional Professional Consult (permissible substitution) (INDEPENDENT_AMBULATORY_CARE_PROVIDER_SITE_OTHER): Admitting: Otolaryngology

## 2024-03-17 ENCOUNTER — Ambulatory Visit: Admitting: Family Medicine

## 2024-03-22 ENCOUNTER — Ambulatory Visit: Admitting: Family
